# Patient Record
Sex: Male | Born: 1945
Health system: Southern US, Community
[De-identification: ages and names within clinical notes are randomized; demographics above are authoritative.]

## PROBLEM LIST (undated history)

## (undated) DIAGNOSIS — F329 Major depressive disorder, single episode, unspecified: Secondary | ICD-10-CM

## (undated) DIAGNOSIS — G4730 Sleep apnea, unspecified: Secondary | ICD-10-CM

## (undated) DIAGNOSIS — Z9079 Acquired absence of other genital organ(s): Secondary | ICD-10-CM

## (undated) DIAGNOSIS — I421 Obstructive hypertrophic cardiomyopathy: Secondary | ICD-10-CM

## (undated) DIAGNOSIS — Z950 Presence of cardiac pacemaker: Secondary | ICD-10-CM

## (undated) DIAGNOSIS — F32A Depression, unspecified: Secondary | ICD-10-CM

## (undated) DIAGNOSIS — C6290 Malignant neoplasm of unspecified testis, unspecified whether descended or undescended: Secondary | ICD-10-CM

## (undated) DIAGNOSIS — E785 Hyperlipidemia, unspecified: Secondary | ICD-10-CM

## (undated) HISTORY — DX: Obstructive hypertrophic cardiomyopathy: I42.1

## (undated) HISTORY — DX: Acquired absence of other genital organ(s): Z90.79

## (undated) HISTORY — DX: Depression, unspecified: F32.A

## (undated) HISTORY — DX: Major depressive disorder, single episode, unspecified: F32.9

## (undated) HISTORY — DX: Hyperlipidemia, unspecified: E78.5

## (undated) HISTORY — DX: Malignant neoplasm of unspecified testis, unspecified whether descended or undescended: C62.90

## (undated) HISTORY — DX: Sleep apnea, unspecified: G47.30

## (undated) HISTORY — PX: ORCHIECTOMY: SHX2116

---

## 1988-11-07 DIAGNOSIS — C6290 Malignant neoplasm of unspecified testis, unspecified whether descended or undescended: Secondary | ICD-10-CM

## 1988-11-07 HISTORY — DX: Malignant neoplasm of unspecified testis, unspecified whether descended or undescended: C62.90

## 1999-04-12 ENCOUNTER — Other Ambulatory Visit: Admission: RE | Admit: 1999-04-12 | Discharge: 1999-04-12 | Payer: Self-pay | Admitting: Family Medicine

## 2001-03-03 ENCOUNTER — Ambulatory Visit (HOSPITAL_COMMUNITY): Admission: RE | Admit: 2001-03-03 | Discharge: 2001-03-03 | Payer: Self-pay | Admitting: Neurosurgery

## 2001-03-03 ENCOUNTER — Encounter: Payer: Self-pay | Admitting: Neurosurgery

## 2001-05-04 ENCOUNTER — Encounter: Admission: RE | Admit: 2001-05-04 | Discharge: 2001-05-04 | Payer: Self-pay | Admitting: Family Medicine

## 2001-05-04 ENCOUNTER — Encounter: Payer: Self-pay | Admitting: Family Medicine

## 2003-11-25 ENCOUNTER — Ambulatory Visit (HOSPITAL_COMMUNITY): Admission: RE | Admit: 2003-11-25 | Discharge: 2003-11-25 | Payer: Self-pay | Admitting: *Deleted

## 2004-09-11 ENCOUNTER — Inpatient Hospital Stay (HOSPITAL_COMMUNITY): Admission: EM | Admit: 2004-09-11 | Discharge: 2004-09-13 | Payer: Self-pay | Admitting: Psychiatry

## 2004-09-11 ENCOUNTER — Ambulatory Visit: Payer: Self-pay | Admitting: Psychiatry

## 2004-09-14 ENCOUNTER — Other Ambulatory Visit (HOSPITAL_COMMUNITY): Admission: RE | Admit: 2004-09-14 | Discharge: 2004-09-15 | Payer: Self-pay | Admitting: Psychiatry

## 2005-07-23 ENCOUNTER — Encounter: Admission: RE | Admit: 2005-07-23 | Discharge: 2005-07-23 | Payer: Self-pay | Admitting: Neurology

## 2008-03-24 ENCOUNTER — Encounter: Admission: RE | Admit: 2008-03-24 | Discharge: 2008-03-24 | Payer: Self-pay | Admitting: Family Medicine

## 2011-03-25 NOTE — Discharge Summary (Signed)
NAMEKEAGON, Michael Barton            ACCOUNT NO.:  [id number]   MEDICAL RECORD NO.:  [id number]          PATIENT TYPE:  IPS   LOCATION:  0307                          FACILITY:  [facility]   PHYSICIAN:  Geoffery Lyons, M.D.      DATE OF BIRTH:  Apr 28, 1946   DATE OF ADMISSION:  09/11/2004  DATE OF DISCHARGE:  09/13/2004                                 DISCHARGE SUMMARY   CHIEF COMPLAINT AND PRESENT ILLNESS:  This was the first admission to [facility] for this 65 year old single white male.  Presented at  the [facility] stating that, during the last few months prior  to this admission, he has been experiencing increased depression.  Endorsed  anhedonia, helplessness, hopelessness, plan to starve himself to death.  Wanted to get his thoughts cleared up.  Episodes of depression as far back  as 1972.  Most recent episode began in the spring of September of 2004.  Depressed and anxious after a financial loss.  Sought treatment with Dr.  Elna Breslow.  He was regulated on Effexor.  His symptoms resolved.  Stayed on  medications until the past spring and then eventually weaned off.  Not sure  why he was getting depressed again.  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   PAST PSYCHIATRIC HISTORY:  First episode was in 61.  Was inpatient in  [place].  Next episode was in 1985.  He was admitted to [facility].  He was patient of Dr. Berna Spare.  He had a free period without  symptoms until spring of 2004.   ALCOHOL/DRUG HISTORY:  Denies the use and there is no evidence of abuse of  any substances.   MEDICAL HISTORY:  Colon spasms.   MEDICATIONS:  Restoril 30 mg at night, Effexor XR 75 mg in the morning.   PHYSICAL EXAMINATION:  Performed and failed to show any acute findings.   LABORATORY DATA:  Within normal limits.   MENTAL STATUS EXAM:  Alert, cooperative male.  Appropriately groomed and  dressed.  Appropriate  eye contact.  Speech was not pressured.  He is  depressed.  Mood was labile.  Affect was congruent, tears up in the  interview.  Wanted reassurance that he would be helped and not sent off to  some mental institution where he would be in a little room the rest of his  life.  He is worrying, unable to stay focused, sense of being overwhelmed.  No active delusions or true paranoia.  Cognition was well-preserved.   ADMISSION DIAGNOSES:   AXIS I:  Major depression, recurrent.   AXIS II:  No diagnosis.   AXIS III:  Spastic colon.   AXIS IV:  Moderate.   AXIS V:  Global Assessment of Functioning upon admission 30; highest Global  Assessment of Functioning in the last year 75-80.   HOSPITAL COURSE:  He was admitted and started in individual and group  psychotherapy.  He was maintained on the Restoril 30 mg at night for sleep.  Effexor was increased to  112.5 mg per day and he was given some Ativan as  needed for anxiety.  He was started on Abilify 20 mg per day.  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  Some suicidal plans to starve himself to death or  shoot himself.  He was given some Risperdal and some Ativan to help with the  acute overwhelming anxiety.  On November 6th, he did sleep.  He felt that  the depression was under better control.  Endorsed decrease in the  ruminative thoughts.  Endorsed that he did not have anymore of those  thoughts.  He usually, when he feels like this, starts catastrophizing and  anticipating the worst.  Fearful that his faith was not strong enough,  ruminating, thinking that there were character defects.  Sense of  hopelessness and helplessness, thinking that he will never get well.  On  November 7th, he was in full contact with reality.  Insightful in terms of  his depression and his obsessive thoughts.  York Spaniel  that he has really never  been suicidal.  The obsessive thoughts make him catastrophize.  Says that  when he gets in that mode, he has a hard time getting out of it.  He was  endorsing no suicidal ideation, no homicidal ideation.  He felt that he  could be treated on an outpatient basis.  He was feeling that staying in the  hospital was not helping but, if anything, could make him worse.  We went  ahead and discharged to pursue outpatient treatment through the mental  health intensive outpatient program at [facility].   DISCHARGE DIAGNOSES:   AXIS I:  1.  Major depression, recurrent.  2.  Anxiety disorder not otherwise specified with obsessive-compulsive      disorder features.   AXIS II:  No diagnosis.   AXIS III:  Spastic colon.   AXIS IV:  Moderate.   AXIS V:  Global Assessment of Functioning upon discharge 50.   DISCHARGE MEDICATIONS:  1.  Effexor XR 150 mg per day.  2.  Risperdal 0.25 mg twice a day.   FOLLOW UP:  Mental health IOP.     Farrel Gordon   IL/MEDQ  D:  10/06/2004  T:  10/06/2004  Job:  [id number]

## 2011-03-25 NOTE — H&P (Signed)
Michael Barton, Michael Barton            ACCOUNT NO.:  [id number]   MEDICAL RECORD NO.:  [id number]          PATIENT TYPE:  IPS   LOCATION:  0307                          FACILITY:  BH   PHYSICIAN:  Geoffery Lyons, M.D.      DATE OF BIRTH:  [birth year]   DATE OF ADMISSION:  09/11/2004  DATE OF DISCHARGE:                         PSYCHIATRIC ADMISSION ASSESSMENT   IDENTIFYING INFORMATION:  This is a voluntary admission to the services of  Dr. Geoffery Lyons.  This is a 65 year old single white male.  He presented  here at the [facility] last night.  Over the last few months,  he has had increasing depression.  Now he is anhedonic, helpless, hopeless,  no energy.  Has a plan to starve himself to death and wants to get his  thoughts cleared up.  Apparently, he has had episodes of depression as far  back as 1972 and his most recent episode began in the spring of September of  2004.  He was depressed and anxious after a financial loss.  He sought  treatment with Dr. Elna Breslow.  He was regulated on Effexor.  His symptoms  resolved.  He stayed on medications until this past spring and then  eventually weaned off.  He is not sure why he is getting depressed again.  He states that a long-time member of his firm left and he also moved from a  house to a condo.  These are the only triggers he can think of for his  depression.   PAST PSYCHIATRIC HISTORY:  As already stated, his first episode was in 39.  He was an inpatient in [place].  He states that initially they felt he was  actually psychotic.  However, that was changed to a depressive episode.  His  next inpatient stay was here, when it was [facility], in 1985.  He was a  patient of Dr. Bradly Chris at that time and, again, he was having repetitive  thoughts and eventually responded to Elavil.  He then had a free period  without symptoms until the spring of 2004.  A mood disorders questionnaire  was administered.  It was not suspicious for an  underlying mood disorder.   SOCIAL HISTORY:  He finished college.  He never married.  He has no  children.  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   FAMILY HISTORY:  His mother had depression, eventually had to have ECT.   ALCOHOL/DRUG HISTORY:  He has no history with drugs or alcohol.   PRIMARY CARE PHYSICIAN:  Dr. Belinda Fisher at [facility] [address].   MEDICAL PROBLEMS:  He has spasms in his colon.  He has not been able to have  an etiology identified nor is he under treatment for this.   MEDICATIONS:  He is currently prescribed temazepam 30 mg q.h.s. and Effexor  XR 75 mg q.a.m.   ALLERGIES:  He has no known drug allergies.   PHYSICAL EXAMINATION:  He is status post two hernia repairs in the 50s and  resection of a seminoma in 1990.  Otherwise, he is a well-developed, well-  nourished,  white male with no abnormal acute findings.   MENTAL STATUS EXAM:  He is alert and oriented x 3.  He is appropriately  groomed and dressed.  His gait and motor are normal.  His eye contact is  good.  His speech is not pressured.  He is depressed.  His mood is labile.  His affect is congruent.  He tears up in the office.  He just wants to know  that he will be helped and not sent off to some mental institution where he  will be in a little room the rest of his life.  His thought processes are  not clear or focused.  There were no delusions or true paranoia.  His  concentration and memory are fair.  His judgment and insight are fair.  He  is still positive for suicidal ideation.  He is negative for homicidal  ideation and he denies any auditory or visual hallucinations.   DIAGNOSES:   AXIS I:  Major depressive disorder, recurrent with suicidal ideation.   AXIS II:  Negative.   AXIS III:  1.  Spastic colon.  2.  Status post resection of seminoma.  3.  Status post repair of hernias in the 50s.   AXIS IV:  None.   AXIS V:  30.   PLAN:  To admit for safety.  To optimize his  medications.  Towards that end,  we will continue to increase his Effexor and add some Abilify 20 mg p.o.  q.d. to help increase his mood and give him some energy, motivation and,  hopefully, to relieve his suicidal ideation and clear up his thoughts.     Mick   MD/MEDQ  D:  09/11/2004  T:  09/11/2004  Job:  [id number]

## 2011-11-10 DIAGNOSIS — C6290 Malignant neoplasm of unspecified testis, unspecified whether descended or undescended: Secondary | ICD-10-CM | POA: Diagnosis not present

## 2011-11-10 DIAGNOSIS — N400 Enlarged prostate without lower urinary tract symptoms: Secondary | ICD-10-CM | POA: Diagnosis not present

## 2011-11-10 DIAGNOSIS — R39198 Other difficulties with micturition: Secondary | ICD-10-CM | POA: Diagnosis not present

## 2011-11-10 DIAGNOSIS — E291 Testicular hypofunction: Secondary | ICD-10-CM | POA: Diagnosis not present

## 2011-11-16 DIAGNOSIS — F3342 Major depressive disorder, recurrent, in full remission: Secondary | ICD-10-CM | POA: Diagnosis not present

## 2011-11-16 DIAGNOSIS — F319 Bipolar disorder, unspecified: Secondary | ICD-10-CM | POA: Insufficient documentation

## 2012-01-10 DIAGNOSIS — E291 Testicular hypofunction: Secondary | ICD-10-CM | POA: Diagnosis not present

## 2012-01-16 DIAGNOSIS — E7800 Pure hypercholesterolemia, unspecified: Secondary | ICD-10-CM | POA: Diagnosis not present

## 2012-01-17 DIAGNOSIS — E291 Testicular hypofunction: Secondary | ICD-10-CM | POA: Diagnosis not present

## 2012-02-23 DIAGNOSIS — F3342 Major depressive disorder, recurrent, in full remission: Secondary | ICD-10-CM | POA: Diagnosis not present

## 2012-03-21 DIAGNOSIS — F3342 Major depressive disorder, recurrent, in full remission: Secondary | ICD-10-CM | POA: Diagnosis not present

## 2012-03-21 DIAGNOSIS — Z5181 Encounter for therapeutic drug level monitoring: Secondary | ICD-10-CM | POA: Diagnosis not present

## 2012-05-03 DIAGNOSIS — B360 Pityriasis versicolor: Secondary | ICD-10-CM | POA: Diagnosis not present

## 2012-05-03 DIAGNOSIS — G4733 Obstructive sleep apnea (adult) (pediatric): Secondary | ICD-10-CM | POA: Diagnosis not present

## 2012-07-12 DIAGNOSIS — E291 Testicular hypofunction: Secondary | ICD-10-CM | POA: Diagnosis not present

## 2012-07-18 DIAGNOSIS — G4733 Obstructive sleep apnea (adult) (pediatric): Secondary | ICD-10-CM | POA: Diagnosis not present

## 2012-07-18 DIAGNOSIS — Z23 Encounter for immunization: Secondary | ICD-10-CM | POA: Diagnosis not present

## 2012-07-18 DIAGNOSIS — R011 Cardiac murmur, unspecified: Secondary | ICD-10-CM | POA: Diagnosis not present

## 2012-07-18 DIAGNOSIS — B360 Pityriasis versicolor: Secondary | ICD-10-CM | POA: Diagnosis not present

## 2012-07-18 DIAGNOSIS — F329 Major depressive disorder, single episode, unspecified: Secondary | ICD-10-CM | POA: Diagnosis not present

## 2012-07-18 DIAGNOSIS — E7800 Pure hypercholesterolemia, unspecified: Secondary | ICD-10-CM | POA: Diagnosis not present

## 2012-07-18 DIAGNOSIS — F3289 Other specified depressive episodes: Secondary | ICD-10-CM | POA: Diagnosis not present

## 2012-07-19 DIAGNOSIS — E291 Testicular hypofunction: Secondary | ICD-10-CM | POA: Diagnosis not present

## 2012-07-19 DIAGNOSIS — N400 Enlarged prostate without lower urinary tract symptoms: Secondary | ICD-10-CM | POA: Diagnosis not present

## 2012-07-19 DIAGNOSIS — Z8547 Personal history of malignant neoplasm of testis: Secondary | ICD-10-CM | POA: Diagnosis not present

## 2012-08-28 DIAGNOSIS — R7989 Other specified abnormal findings of blood chemistry: Secondary | ICD-10-CM | POA: Diagnosis not present

## 2012-08-28 DIAGNOSIS — F3342 Major depressive disorder, recurrent, in full remission: Secondary | ICD-10-CM | POA: Diagnosis not present

## 2012-09-25 DIAGNOSIS — M25569 Pain in unspecified knee: Secondary | ICD-10-CM | POA: Diagnosis not present

## 2012-09-25 DIAGNOSIS — M7020 Olecranon bursitis, unspecified elbow: Secondary | ICD-10-CM | POA: Diagnosis not present

## 2012-11-22 DIAGNOSIS — M7020 Olecranon bursitis, unspecified elbow: Secondary | ICD-10-CM | POA: Diagnosis not present

## 2012-11-27 DIAGNOSIS — F3342 Major depressive disorder, recurrent, in full remission: Secondary | ICD-10-CM | POA: Diagnosis not present

## 2012-12-05 DIAGNOSIS — M7020 Olecranon bursitis, unspecified elbow: Secondary | ICD-10-CM | POA: Diagnosis not present

## 2013-01-14 DIAGNOSIS — C6290 Malignant neoplasm of unspecified testis, unspecified whether descended or undescended: Secondary | ICD-10-CM | POA: Diagnosis not present

## 2013-01-14 DIAGNOSIS — E291 Testicular hypofunction: Secondary | ICD-10-CM | POA: Diagnosis not present

## 2013-01-17 DIAGNOSIS — N400 Enlarged prostate without lower urinary tract symptoms: Secondary | ICD-10-CM | POA: Diagnosis not present

## 2013-01-17 DIAGNOSIS — Z125 Encounter for screening for malignant neoplasm of prostate: Secondary | ICD-10-CM | POA: Diagnosis not present

## 2013-01-17 DIAGNOSIS — E291 Testicular hypofunction: Secondary | ICD-10-CM | POA: Diagnosis not present

## 2013-01-17 DIAGNOSIS — C6290 Malignant neoplasm of unspecified testis, unspecified whether descended or undescended: Secondary | ICD-10-CM | POA: Diagnosis not present

## 2013-02-26 DIAGNOSIS — F319 Bipolar disorder, unspecified: Secondary | ICD-10-CM | POA: Diagnosis not present

## 2013-03-18 DIAGNOSIS — Z1211 Encounter for screening for malignant neoplasm of colon: Secondary | ICD-10-CM | POA: Diagnosis not present

## 2013-04-22 DIAGNOSIS — E291 Testicular hypofunction: Secondary | ICD-10-CM | POA: Diagnosis not present

## 2013-04-22 DIAGNOSIS — N400 Enlarged prostate without lower urinary tract symptoms: Secondary | ICD-10-CM | POA: Diagnosis not present

## 2013-04-22 DIAGNOSIS — C6290 Malignant neoplasm of unspecified testis, unspecified whether descended or undescended: Secondary | ICD-10-CM | POA: Diagnosis not present

## 2013-05-20 DIAGNOSIS — H25099 Other age-related incipient cataract, unspecified eye: Secondary | ICD-10-CM | POA: Diagnosis not present

## 2013-05-20 DIAGNOSIS — H5210 Myopia, unspecified eye: Secondary | ICD-10-CM | POA: Diagnosis not present

## 2013-07-18 DIAGNOSIS — Z1331 Encounter for screening for depression: Secondary | ICD-10-CM | POA: Diagnosis not present

## 2013-07-18 DIAGNOSIS — E7800 Pure hypercholesterolemia, unspecified: Secondary | ICD-10-CM | POA: Diagnosis not present

## 2013-07-18 DIAGNOSIS — G4733 Obstructive sleep apnea (adult) (pediatric): Secondary | ICD-10-CM | POA: Diagnosis not present

## 2013-07-18 DIAGNOSIS — E291 Testicular hypofunction: Secondary | ICD-10-CM | POA: Diagnosis not present

## 2013-07-18 DIAGNOSIS — N183 Chronic kidney disease, stage 3 unspecified: Secondary | ICD-10-CM | POA: Diagnosis not present

## 2013-07-18 DIAGNOSIS — Z79899 Other long term (current) drug therapy: Secondary | ICD-10-CM | POA: Diagnosis not present

## 2013-07-18 DIAGNOSIS — Z1211 Encounter for screening for malignant neoplasm of colon: Secondary | ICD-10-CM | POA: Diagnosis not present

## 2013-07-18 DIAGNOSIS — F329 Major depressive disorder, single episode, unspecified: Secondary | ICD-10-CM | POA: Diagnosis not present

## 2013-08-14 DIAGNOSIS — F319 Bipolar disorder, unspecified: Secondary | ICD-10-CM | POA: Diagnosis not present

## 2013-08-14 DIAGNOSIS — Z79899 Other long term (current) drug therapy: Secondary | ICD-10-CM | POA: Diagnosis not present

## 2013-10-29 DIAGNOSIS — Z79899 Other long term (current) drug therapy: Secondary | ICD-10-CM | POA: Diagnosis not present

## 2013-10-29 DIAGNOSIS — F319 Bipolar disorder, unspecified: Secondary | ICD-10-CM | POA: Diagnosis not present

## 2013-11-11 DIAGNOSIS — D649 Anemia, unspecified: Secondary | ICD-10-CM | POA: Diagnosis not present

## 2013-12-17 DIAGNOSIS — F319 Bipolar disorder, unspecified: Secondary | ICD-10-CM | POA: Diagnosis not present

## 2013-12-30 DIAGNOSIS — Z1211 Encounter for screening for malignant neoplasm of colon: Secondary | ICD-10-CM | POA: Diagnosis not present

## 2013-12-30 DIAGNOSIS — K620 Anal polyp: Secondary | ICD-10-CM | POA: Diagnosis not present

## 2013-12-30 DIAGNOSIS — K648 Other hemorrhoids: Secondary | ICD-10-CM | POA: Diagnosis not present

## 2013-12-30 DIAGNOSIS — D126 Benign neoplasm of colon, unspecified: Secondary | ICD-10-CM | POA: Diagnosis not present

## 2013-12-30 DIAGNOSIS — K621 Rectal polyp: Secondary | ICD-10-CM | POA: Diagnosis not present

## 2014-01-16 DIAGNOSIS — C6290 Malignant neoplasm of unspecified testis, unspecified whether descended or undescended: Secondary | ICD-10-CM | POA: Diagnosis not present

## 2014-01-16 DIAGNOSIS — N400 Enlarged prostate without lower urinary tract symptoms: Secondary | ICD-10-CM | POA: Diagnosis not present

## 2014-01-23 DIAGNOSIS — C6290 Malignant neoplasm of unspecified testis, unspecified whether descended or undescended: Secondary | ICD-10-CM | POA: Diagnosis not present

## 2014-01-23 DIAGNOSIS — N400 Enlarged prostate without lower urinary tract symptoms: Secondary | ICD-10-CM | POA: Diagnosis not present

## 2014-04-22 DIAGNOSIS — F319 Bipolar disorder, unspecified: Secondary | ICD-10-CM | POA: Diagnosis not present

## 2014-07-07 DIAGNOSIS — F319 Bipolar disorder, unspecified: Secondary | ICD-10-CM | POA: Diagnosis not present

## 2014-07-18 DIAGNOSIS — F329 Major depressive disorder, single episode, unspecified: Secondary | ICD-10-CM | POA: Diagnosis not present

## 2014-07-18 DIAGNOSIS — N183 Chronic kidney disease, stage 3 unspecified: Secondary | ICD-10-CM | POA: Diagnosis not present

## 2014-07-18 DIAGNOSIS — G4733 Obstructive sleep apnea (adult) (pediatric): Secondary | ICD-10-CM | POA: Diagnosis not present

## 2014-07-18 DIAGNOSIS — E7800 Pure hypercholesterolemia, unspecified: Secondary | ICD-10-CM | POA: Diagnosis not present

## 2014-07-18 DIAGNOSIS — Z79899 Other long term (current) drug therapy: Secondary | ICD-10-CM | POA: Diagnosis not present

## 2014-07-18 DIAGNOSIS — Z125 Encounter for screening for malignant neoplasm of prostate: Secondary | ICD-10-CM | POA: Diagnosis not present

## 2014-07-18 DIAGNOSIS — E871 Hypo-osmolality and hyponatremia: Secondary | ICD-10-CM | POA: Diagnosis not present

## 2014-07-18 DIAGNOSIS — Z23 Encounter for immunization: Secondary | ICD-10-CM | POA: Diagnosis not present

## 2014-07-30 DIAGNOSIS — E876 Hypokalemia: Secondary | ICD-10-CM | POA: Diagnosis not present

## 2014-08-28 DIAGNOSIS — R51 Headache: Secondary | ICD-10-CM | POA: Diagnosis not present

## 2014-08-28 DIAGNOSIS — H5213 Myopia, bilateral: Secondary | ICD-10-CM | POA: Diagnosis not present

## 2014-10-14 DIAGNOSIS — G4733 Obstructive sleep apnea (adult) (pediatric): Secondary | ICD-10-CM | POA: Diagnosis not present

## 2014-10-14 DIAGNOSIS — Z23 Encounter for immunization: Secondary | ICD-10-CM | POA: Diagnosis not present

## 2014-10-14 DIAGNOSIS — E780 Pure hypercholesterolemia: Secondary | ICD-10-CM | POA: Diagnosis not present

## 2014-11-13 ENCOUNTER — Encounter: Payer: Self-pay | Admitting: *Deleted

## 2015-01-22 DIAGNOSIS — N400 Enlarged prostate without lower urinary tract symptoms: Secondary | ICD-10-CM | POA: Diagnosis not present

## 2015-01-22 DIAGNOSIS — C6290 Malignant neoplasm of unspecified testis, unspecified whether descended or undescended: Secondary | ICD-10-CM | POA: Diagnosis not present

## 2015-01-22 DIAGNOSIS — Z125 Encounter for screening for malignant neoplasm of prostate: Secondary | ICD-10-CM | POA: Diagnosis not present

## 2015-01-29 DIAGNOSIS — C6290 Malignant neoplasm of unspecified testis, unspecified whether descended or undescended: Secondary | ICD-10-CM | POA: Diagnosis not present

## 2015-01-29 DIAGNOSIS — E291 Testicular hypofunction: Secondary | ICD-10-CM | POA: Diagnosis not present

## 2015-02-09 DIAGNOSIS — E291 Testicular hypofunction: Secondary | ICD-10-CM | POA: Diagnosis not present

## 2015-02-17 DIAGNOSIS — E291 Testicular hypofunction: Secondary | ICD-10-CM | POA: Diagnosis not present

## 2015-03-24 DIAGNOSIS — E291 Testicular hypofunction: Secondary | ICD-10-CM | POA: Diagnosis not present

## 2015-05-14 DIAGNOSIS — E291 Testicular hypofunction: Secondary | ICD-10-CM | POA: Diagnosis not present

## 2015-05-21 DIAGNOSIS — E291 Testicular hypofunction: Secondary | ICD-10-CM | POA: Diagnosis not present

## 2015-07-07 DIAGNOSIS — F319 Bipolar disorder, unspecified: Secondary | ICD-10-CM | POA: Diagnosis not present

## 2015-07-20 DIAGNOSIS — E780 Pure hypercholesterolemia: Secondary | ICD-10-CM | POA: Diagnosis not present

## 2015-07-20 DIAGNOSIS — Z79899 Other long term (current) drug therapy: Secondary | ICD-10-CM | POA: Diagnosis not present

## 2015-07-20 DIAGNOSIS — N183 Chronic kidney disease, stage 3 (moderate): Secondary | ICD-10-CM | POA: Diagnosis not present

## 2015-07-20 DIAGNOSIS — F322 Major depressive disorder, single episode, severe without psychotic features: Secondary | ICD-10-CM | POA: Diagnosis not present

## 2015-07-20 DIAGNOSIS — E291 Testicular hypofunction: Secondary | ICD-10-CM | POA: Diagnosis not present

## 2015-07-20 DIAGNOSIS — Z1389 Encounter for screening for other disorder: Secondary | ICD-10-CM | POA: Diagnosis not present

## 2015-07-20 DIAGNOSIS — L821 Other seborrheic keratosis: Secondary | ICD-10-CM | POA: Diagnosis not present

## 2015-07-20 DIAGNOSIS — G4733 Obstructive sleep apnea (adult) (pediatric): Secondary | ICD-10-CM | POA: Diagnosis not present

## 2015-09-15 DIAGNOSIS — H35033 Hypertensive retinopathy, bilateral: Secondary | ICD-10-CM | POA: Diagnosis not present

## 2015-09-15 DIAGNOSIS — H52223 Regular astigmatism, bilateral: Secondary | ICD-10-CM | POA: Diagnosis not present

## 2015-09-15 DIAGNOSIS — H25093 Other age-related incipient cataract, bilateral: Secondary | ICD-10-CM | POA: Diagnosis not present

## 2015-11-17 DIAGNOSIS — E291 Testicular hypofunction: Secondary | ICD-10-CM | POA: Diagnosis not present

## 2015-11-17 DIAGNOSIS — Z125 Encounter for screening for malignant neoplasm of prostate: Secondary | ICD-10-CM | POA: Diagnosis not present

## 2015-11-24 DIAGNOSIS — E291 Testicular hypofunction: Secondary | ICD-10-CM | POA: Diagnosis not present

## 2015-11-24 DIAGNOSIS — Z0000 Encounter for general adult medical examination without abnormal findings: Secondary | ICD-10-CM | POA: Diagnosis not present

## 2016-01-19 DIAGNOSIS — Z79899 Other long term (current) drug therapy: Secondary | ICD-10-CM | POA: Diagnosis not present

## 2016-01-19 DIAGNOSIS — F319 Bipolar disorder, unspecified: Secondary | ICD-10-CM | POA: Diagnosis not present

## 2016-01-29 DIAGNOSIS — E871 Hypo-osmolality and hyponatremia: Secondary | ICD-10-CM | POA: Insufficient documentation

## 2016-01-29 DIAGNOSIS — R011 Cardiac murmur, unspecified: Secondary | ICD-10-CM | POA: Insufficient documentation

## 2016-01-29 DIAGNOSIS — R0600 Dyspnea, unspecified: Secondary | ICD-10-CM | POA: Diagnosis not present

## 2016-02-23 ENCOUNTER — Ambulatory Visit (INDEPENDENT_AMBULATORY_CARE_PROVIDER_SITE_OTHER): Payer: Medicare Other | Admitting: Cardiovascular Disease

## 2016-02-23 ENCOUNTER — Encounter: Payer: Self-pay | Admitting: Cardiovascular Disease

## 2016-02-23 VITALS — BP 140/90 | HR 68 | Ht 75.0 in | Wt 209.6 lb

## 2016-02-23 DIAGNOSIS — E785 Hyperlipidemia, unspecified: Secondary | ICD-10-CM

## 2016-02-23 DIAGNOSIS — R0609 Other forms of dyspnea: Secondary | ICD-10-CM | POA: Diagnosis not present

## 2016-02-23 DIAGNOSIS — R0602 Shortness of breath: Secondary | ICD-10-CM | POA: Diagnosis not present

## 2016-02-23 NOTE — Patient Instructions (Addendum)
Medication Instructions:  Your physician recommends that you continue on your current medications as directed. Please refer to the Current Medication list given to you today.   Labwork: None Ordered   Testing/Procedures: Your physician has requested that you have an echocardiogram. Echocardiography is a painless test that uses sound waves to create images of your heart. It provides your doctor with information about the size and shape of your heart and how well your heart's chambers and valves are working. This procedure takes approximately one hour. There are no restrictions for this procedure.   Your physician has requested that you have a lexiscan myoview. For further information please visit [email or web address]. Please follow instruction sheet, as given.   Follow-Up: Your physician recommends that you schedule a follow-up appointment in: 1 month with Dr. Acie Fredrickson   If you need a refill on your cardiac medications before your next appointment, please call your pharmacy.   Thank you for choosing [facility]! Christen Bame, RN [phone]

## 2016-02-23 NOTE — Progress Notes (Signed)
Cardiology Office Note   Date:  02/23/2016   ID:  Michael Barton, DOB 1946-08-08, MRN [id number]  PCP:  Simona Huh, MD  Cardiologist:   Thayer Headings, MD   Chief Complaint  Patient presents with  . Shortness of Breath   1. Shortness of breath 2. Hyperlipidemia 3. Obstructive sleep apnea 4. Depression    History of Present Illness: Michael Barton is a 70 y.o. male who presents for DOE Was an avid runner and cyclist until 5 years ago. Is not able to run or cycle recently - has been a steady decline  He can now run for 1 minute and then walk for 2-3   He has noticed a definint decline in his abiltiy - now gets short of breath doing everyday activities. He become short of breath climbing stairs and walking from his office out to the car.  He denies any chest pain. He has no shortness of breath at rest. He's had an echo card gram the past which reveals left ventricular hypertrophy.  He work at an Database administrator    Past Medical History  Diagnosis Date  . Hyperlipidemia   . Depression   . Sleep apnea   . Testicular cancer [facility])     Past Surgical History  Procedure Laterality Date  . Orchiectomy       Current Outpatient Prescriptions  Medication Sig Dispense Refill  . ANDROGEL PUMP 20.25 MG/ACT (1.62%) GEL Apply 1 application topically daily. 1 APPLICATION UNDER EACH ARM DAILY    . buPROPion (WELLBUTRIN) 100 MG tablet Take 100 mg by mouth 2 (two) times daily.    Marland Kitchen lamoTRIgine (LAMICTAL) 200 MG tablet Take 200 mg by mouth daily.    . naproxen sodium (ANAPROX) 220 MG tablet Take 220 mg by mouth 2 (two) times daily with a meal.    . pravastatin (PRAVACHOL) 40 MG tablet Take 40 mg by mouth daily.    . QUEtiapine (SEROQUEL) 200 MG tablet Take 200 mg by mouth at bedtime.    Marland Kitchen venlafaxine XR (EFFEXOR-XR) 150 MG 24 hr capsule Take 150 mg by mouth daily with breakfast.     No current facility-administered medications for this visit.    Allergies:    Ambien and Prozac    Social History:  The patient  reports that he has never smoked. He does not have any smokeless tobacco history on file. He reports that he does not drink alcohol or use illicit drugs.   Family History:  The patient's family history includes Cancer in his father; Depression in his mother; Thyroid disease in his mother.    ROS:  Please see the history of present illness.    Review of Systems: Constitutional:  denies fever, chills, diaphoresis, appetite change and fatigue.  HEENT: denies photophobia, eye pain, redness, hearing loss, ear pain, congestion, sore throat, rhinorrhea, sneezing, neck pain, neck stiffness and tinnitus.  Respiratory: admits to SOB, DOE,    Denies cough, chest tightness, and wheezing.  Cardiovascular: denies chest pain, palpitations and leg swelling.  Gastrointestinal: denies nausea, vomiting, abdominal pain, diarrhea, constipation, blood in stool.  Genitourinary: denies dysuria, urgency, frequency, hematuria, flank pain and difficulty urinating.  Musculoskeletal: denies  myalgias, back pain, joint swelling, arthralgias and gait problem.   Skin: denies pallor, rash and wound.  Neurological: denies dizziness, seizures, syncope, weakness, light-headedness, numbness and headaches.   Hematological: denies adenopathy, easy bruising, personal or family bleeding history.  Psychiatric/ Behavioral: denies suicidal ideation, mood changes,  confusion, nervousness, sleep disturbance and agitation.       All other systems are reviewed and negative.    PHYSICAL EXAM: VS:  BP 140/90 mmHg  Pulse 68  Ht 6\' 3"  (1.905 m)  Wt 209 lb 9.6 oz (95.074 kg)  BMI 26.20 kg/m2 , BMI Body mass index is 26.2 kg/(m^2). GEN: Well nourished, well developed, in no acute distress HEENT: normal Neck: no JVD, carotid bruits, or masses Cardiac: RRR; soft systolic  Murmur,  No , rubs, or gallops,no edema  Respiratory:  clear to auscultation bilaterally, normal work of  breathing GI: soft, nontender, nondistended, + BS MS: no deformity or atrophy Skin: warm and dry, no rash Neuro:  Strength and sensation are intact Psych: normal   EKG:  EKG is ordered today. The ekg ordered today demonstrates  NSR at 68.   TWI in lateral leads ( leads 1 and aVL)    Recent Labs: No results found for requested labs within last 365 days.    Lipid Panel No results found for: CHOL, TRIG, HDL, CHOLHDL, VLDL, LDLCALC, LDLDIRECT    Wt Readings from Last 3 Encounters:  02/23/16 209 lb 9.6 oz (95.074 kg)      Other studies Reviewed: Additional studies/ records that were reviewed today include: . Review of the above records demonstrates:    ASSESSMENT AND PLAN:  1.  Shortness of breath with exertion: Show presents with episodes of shortness of breath with exertion. These episodes have been progressive for the past 4-5 years. He used to be an avid runner and avid cyclist but now become short of breath doing everyday activities. He can still talk for about a minute but then has to stop and walk for 2-3 minutes.  He has a systolic murmur but it does not sound like he has significant valvular problems. It also is not consistent with a hypertrophic obstructive cardio myopathy.  Right to do a stress Myoview study for further evaluation. He could have ischemic heart disease. We'll also get an echo card gram for further assessment of his shortness of breath with exertion.   Current medicines are reviewed at length with the patient today.  The patient does not have concerns regarding medicines.  The following changes have been made:  no change  Labs/ tests ordered today include:  No orders of the defined types were placed in this encounter.     Disposition:   FU with me in several months       Evamarie Raetz, Wonda Cheng, MD  02/23/2016 4:15 PM    [facility] [place], [place], [place]  [address] Phone: [phone]; Fax: [phone]    [facility]  [address] [place], [place]  [address] [phone]   Fax [phone]

## 2016-02-24 ENCOUNTER — Telehealth: Payer: Self-pay | Admitting: Cardiovascular Disease

## 2016-02-24 NOTE — Telephone Encounter (Signed)
Walk in patient form- medication list - given to [facility]

## 2016-03-17 ENCOUNTER — Ambulatory Visit (HOSPITAL_COMMUNITY): Payer: Medicare Other | Attending: Internal Medicine

## 2016-03-22 ENCOUNTER — Encounter (HOSPITAL_COMMUNITY): Payer: Medicare Other

## 2016-03-23 ENCOUNTER — Ambulatory Visit (HOSPITAL_COMMUNITY): Payer: Medicare Other

## 2016-03-23 ENCOUNTER — Telehealth (HOSPITAL_COMMUNITY): Payer: Self-pay | Admitting: *Deleted

## 2016-03-23 NOTE — Telephone Encounter (Signed)
.  Attempted to call Patient regarding upcoming nuclear appointment- no answer. Hubbard Robinson, RN

## 2016-03-28 ENCOUNTER — Ambulatory Visit (HOSPITAL_COMMUNITY): Payer: Medicare Other | Attending: Cardiology

## 2016-03-28 DIAGNOSIS — R0609 Other forms of dyspnea: Secondary | ICD-10-CM | POA: Diagnosis not present

## 2016-03-28 LAB — MYOCARDIAL PERFUSION IMAGING
CHL CUP NUCLEAR SSS: 9
LHR: 0.22
LV dias vol: 129 mL (ref 62–150)
LV sys vol: 55 mL
Peak HR: 75 {beats}/min
Rest HR: 60 {beats}/min
SDS: 3
SRS: 6
TID: 1.02

## 2016-03-28 MED ORDER — REGADENOSON 0.4 MG/5ML IV SOLN
0.4000 mg | Freq: Once | INTRAVENOUS | Status: AC
  Administered 2016-03-28: 0.4 mg via INTRAVENOUS

## 2016-03-28 MED ORDER — TECHNETIUM TC 99M TETROFOSMIN IV KIT
31.9000 | PACK | Freq: Once | INTRAVENOUS | Status: AC | PRN
  Administered 2016-03-28: 31.9 via INTRAVENOUS
  Filled 2016-03-28: qty 32

## 2016-03-28 MED ORDER — TECHNETIUM TC 99M TETROFOSMIN IV KIT
11.0000 | PACK | Freq: Once | INTRAVENOUS | Status: AC | PRN
  Administered 2016-03-28: 11 via INTRAVENOUS
  Filled 2016-03-28: qty 11

## 2016-03-29 ENCOUNTER — Ambulatory Visit (HOSPITAL_COMMUNITY): Payer: Medicare Other

## 2016-03-31 ENCOUNTER — Encounter: Payer: Self-pay | Admitting: Cardiology

## 2016-04-01 ENCOUNTER — Ambulatory Visit (INDEPENDENT_AMBULATORY_CARE_PROVIDER_SITE_OTHER): Payer: Medicare Other | Admitting: Cardiovascular Disease

## 2016-04-01 ENCOUNTER — Encounter: Payer: Self-pay | Admitting: Cardiovascular Disease

## 2016-04-01 VITALS — BP 134/72 | HR 80 | Ht 75.0 in | Wt 212.8 lb

## 2016-04-01 DIAGNOSIS — R0609 Other forms of dyspnea: Secondary | ICD-10-CM

## 2016-04-01 DIAGNOSIS — R0600 Dyspnea, unspecified: Secondary | ICD-10-CM | POA: Diagnosis not present

## 2016-04-01 NOTE — Progress Notes (Signed)
Cardiology Office Note   Date:  04/01/2016   ID:  Michael Barton, DOB Feb 20, 1946, MRN [id number]  PCP:  Simona Huh, MD  Cardiologist:   Mertie Moores, MD   Chief Complaint  Patient presents with  . Shortness of Breath   1. Shortness of breath 2. Hyperlipidemia 3. Obstructive sleep apnea 4. Depression    History of Present Illness: Michael Barton is a 70 y.o. male who presents for DOE Was an avid runner and cyclist until 5 years ago. Is not able to run or cycle recently - has been a steady decline  He can now run for 1 minute and then walk for 2-3   He has noticed a definint decline in his abiltiy - now gets short of breath doing everyday activities. He become short of breath climbing stairs and walking from his office out to the car.  He denies any chest pain. He has no shortness of breath at rest. He's had an echo card gram the past which reveals left ventricular hypertrophy.  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 A stress Myoview study showed no ischemia. His ejection fraction was 58%.  Michael Barton has not improved.   Has not had his echo yet.   Past Medical History  Diagnosis Date  . Hyperlipidemia   . Depression   . Sleep apnea   . Testicular cancer [facility])     Past Surgical History  Procedure Laterality Date  . Orchiectomy       Current Outpatient Prescriptions  Medication Sig Dispense Refill  . ANDROGEL PUMP 20.25 MG/ACT (1.62%) GEL Apply 1 application topically daily. 1 APPLICATION UNDER EACH ARM DAILY    . buPROPion (WELLBUTRIN) 100 MG tablet Take 100 mg by mouth 2 (two) times daily.    Marland Kitchen lamoTRIgine (LAMICTAL) 200 MG tablet Take 200 mg by mouth daily.    . naproxen sodium (ANAPROX) 220 MG tablet Take 220 mg by mouth 2 (two) times daily with a meal.    . pravastatin (PRAVACHOL) 40 MG tablet Take 40 mg by mouth daily.    . QUEtiapine (SEROQUEL)  200 MG tablet Take 200 mg by mouth at bedtime.    Marland Kitchen venlafaxine XR (EFFEXOR-XR) 150 MG 24 hr capsule Take 150 mg by mouth daily with breakfast.     No current facility-administered medications for this visit.    Allergies:   Ambien and Prozac    Social History:  The patient  reports that he has never smoked. He does not have any smokeless tobacco history on file. He reports that he does not drink alcohol or use illicit drugs.   Family History:  The patient's family history includes Cancer in his father; Depression in his mother; Thyroid disease in his mother.    ROS:  Please see the history of present illness.    Review of Systems: Constitutional:  denies fever, chills, diaphoresis, appetite change and fatigue.  HEENT: denies photophobia, eye pain, redness, hearing loss, ear pain, congestion, sore throat, rhinorrhea, sneezing, neck pain, neck stiffness and tinnitus.  Respiratory: admits to SOB, DOE,    Denies cough, chest tightness, and wheezing.  Cardiovascular: denies chest pain, palpitations and leg swelling.  Gastrointestinal: denies nausea, vomiting, abdominal pain, diarrhea, constipation, blood in stool.  Genitourinary: denies dysuria, urgency, frequency, hematuria, flank pain and difficulty urinating.  Musculoskeletal: denies  myalgias, back pain, joint swelling,  arthralgias and gait problem.   Skin: denies pallor, rash and wound.  Neurological: denies dizziness, seizures, syncope, weakness, light-headedness, numbness and headaches.   Hematological: denies adenopathy, easy bruising, personal or family bleeding history.  Psychiatric/ Behavioral: denies suicidal ideation, mood changes, confusion, nervousness, sleep disturbance and agitation.       All other systems are reviewed and negative.    PHYSICAL EXAM: VS:  BP 134/72 mmHg  Pulse 80  Ht 6\' 3"  (1.905 m)  Wt 212 lb 12.8 oz (96.525 kg)  BMI 26.60 kg/m2 , BMI Body mass index is 26.6 kg/(m^2). GEN: Well nourished, well  developed, in no acute distress HEENT: normal Neck: no JVD, carotid bruits, or masses Cardiac: RRR; soft systolic Murmur,  No , rubs, or gallops,no edema  Respiratory:  clear to auscultation bilaterally, normal work of breathing GI: soft, nontender, nondistended, + BS MS: no deformity or atrophy Skin: warm and dry, no rash Neuro:  Strength and sensation are intact Psych: normal   EKG:  EKG is ordered today. The ekg ordered today demonstrates  NSR at 68.   TWI in lateral leads ( leads 1 and aVL)    Recent Labs: No results found for requested labs within last 365 days.    Lipid Panel No results found for: CHOL, TRIG, HDL, CHOLHDL, VLDL, LDLCALC, LDLDIRECT    Wt Readings from Last 3 Encounters:  04/01/16 212 lb 12.8 oz (96.525 kg)  02/23/16 209 lb 9.6 oz (95.074 kg)      Other studies Reviewed: Additional studies/ records that were reviewed today include: . Review of the above records demonstrates:    ASSESSMENT AND PLAN:  1.  Shortness of breath with exertion: Michael Barton has been an avid runner and cyclist for years but has noticed a recent deteriation in his endurance. Stress myoview showed no ischemia.    Echo has not been done yet I would like to get a cardiopulmonary stress test  He is considering getting a pulmonary consultation  I've advised him to work on getting back into shape - start slow and gradually build up his work outs.    Current medicines are reviewed at length with the patient today.  The patient does not have concerns regarding medicines.  The following changes have been made:  no change  Labs/ tests ordered today include:   Orders Placed This Encounter  Procedures  . Cardiopulmonary exercise test    Disposition:   FU with me in 6  months     Mertie Moores, MD  04/01/2016 2:52 PM    [facility] [place], [place], [place]  [address] Phone: [phone]; Fax: [phone]   [facility]  [address] [place], [place]  [address] [phone]   Fax [phone]

## 2016-04-01 NOTE — Patient Instructions (Signed)
Medication Instructions:  Your physician recommends that you continue on your current medications as directed. Please refer to the Current Medication list given to you today.   Labwork: None Ordered   Testing/Procedures: SCHEDULE Echo - it is already ordered  Your physician has recommended that you have a cardiopulmonary stress test (CPX). CPX testing is a non-invasive measurement of heart and lung function. It replaces a traditional treadmill stress test. This type of test provides a tremendous amount of information that relates not only to your present condition but also for future outcomes. This test combines measurements of you ventilation, respiratory gas exchange in the lungs, electrocardiogram (EKG), blood pressure and physical response before, during, and following an exercise protocol.   Follow-Up: Your physician wants you to follow-up in: 6 months with Dr. Acie Fredrickson.  You will receive a reminder letter in the mail two months in advance. If you don't receive a letter, please call our office to schedule the follow-up appointment.   If you need a refill on your cardiac medications before your next appointment, please call your pharmacy.   Thank you for choosing [facility]! Christen Bame, RN [phone]

## 2016-04-12 ENCOUNTER — Other Ambulatory Visit (HOSPITAL_COMMUNITY): Payer: Self-pay | Admitting: *Deleted

## 2016-04-12 ENCOUNTER — Ambulatory Visit (HOSPITAL_COMMUNITY): Payer: Medicare Other | Attending: Cardiovascular Disease

## 2016-04-12 DIAGNOSIS — R0600 Dyspnea, unspecified: Secondary | ICD-10-CM | POA: Insufficient documentation

## 2016-04-13 ENCOUNTER — Telehealth: Payer: Self-pay | Admitting: Nurse Practitioner

## 2016-04-13 MED ORDER — CARVEDILOL 3.125 MG PO TABS: 3.1250 mg | ORAL_TABLET | Freq: Two times a day (BID) | ORAL | Status: DC

## 2016-04-13 NOTE — Telephone Encounter (Signed)
Reviewed results and plan of care with patient.  He was scheduled for 6 month follow-up so I have scheduled him to see Dr. Acie Fredrickson in August to see how he is tolerating carvedilol.  Patient verbalized understanding and agreement with plan of care and thanked me for the call.

## 2016-04-13 NOTE — Telephone Encounter (Signed)
-----   Message from Thayer Headings, MD sent at 04/12/2016  5:23 PM EDT ----- Overall normal functional capacity.   Slight hypertensive response to exercise Will add Carvedilol 3.125 bid . Have him continue to gradually increase his work outs.  Will see him in several months

## 2016-04-18 ENCOUNTER — Other Ambulatory Visit: Payer: Self-pay

## 2016-04-18 ENCOUNTER — Ambulatory Visit (HOSPITAL_COMMUNITY): Payer: Medicare Other | Attending: Cardiovascular Disease

## 2016-04-18 DIAGNOSIS — E785 Hyperlipidemia, unspecified: Secondary | ICD-10-CM | POA: Diagnosis not present

## 2016-04-18 DIAGNOSIS — I517 Cardiomegaly: Secondary | ICD-10-CM | POA: Diagnosis not present

## 2016-04-18 DIAGNOSIS — R0600 Dyspnea, unspecified: Secondary | ICD-10-CM | POA: Diagnosis present

## 2016-04-18 DIAGNOSIS — R0609 Other forms of dyspnea: Secondary | ICD-10-CM | POA: Diagnosis not present

## 2016-04-18 DIAGNOSIS — I340 Nonrheumatic mitral (valve) insufficiency: Secondary | ICD-10-CM | POA: Diagnosis not present

## 2016-04-18 LAB — ECHOCARDIOGRAM COMPLETE
AO mean calculated velocity dopler: 163 cm/s
AOVTI: 48.8 cm
AV Area VTI index: 1.07 cm2/m2
AV Area VTI: 2.37 cm2
AV Mean grad: 12 mmHg
AV Peak grad: 20 mmHg
AVAREAMEANV: 2.39 cm2
AVAREAMEANVIN: 1.05 cm2/m2
AVCELMEANRAT: 0.84
AVLVOTPG: 14 mmHg
AVPKVEL: 225 cm/s
Ao pk vel: 0.84 m/s
Ao-asc: 35 cm
CHL CUP AV PEAK INDEX: 1.04
CHL CUP AV VALUE AREA INDEX: 1.07
CHL CUP AV VEL: 2.42
CHL CUP DOP CALC LVOT VTI: 41.5 cm
CHL CUP STROKE VOLUME: 63 mL
E decel time: 289 msec
EERAT: 10.72
FS: 29 % (ref 28–44)
IVS/LV PW RATIO, ED: 1.13
LA ID, A-P, ES: 41 mm
LA vol index: 33 mL/m2
LADIAMINDEX: 1.81 cm/m2
LAVOL: 75 mL
LAVOLA4C: 53 mL
LDCA: 2.84 cm2
LEFT ATRIUM END SYS DIAM: 41 mm
LV E/e' medial: 10.72
LV E/e'average: 10.72
LV SIMPSON'S DISK: 68
LV TDI E'MEDIAL: 4.48
LV dias vol index: 41 mL/m2
LV dias vol: 92 mL (ref 62–150)
LV sys vol index: 13 mL/m2
LVELAT: 7.51 cm/s
LVOT SV: 118 mL
LVOT diameter: 19 mm
LVOTPV: 188 cm/s
LVOTVTI: 0.85 cm
LVSYSVOL: 29 mL (ref 21–61)
Lateral S' vel: 10.5 cm/s
MV Dec: 289
MVPG: 3 mmHg
MVPKAVEL: 94.8 m/s
MVPKEVEL: 80.5 m/s
PW: 14.5 mm — AB (ref 0.6–1.1)
TDI e' lateral: 7.51
Valve area: 2.42 cm2

## 2016-04-19 ENCOUNTER — Telehealth: Payer: Self-pay | Admitting: Cardiovascular Disease

## 2016-04-19 NOTE — Telephone Encounter (Signed)
Santiago Glad from [employer] is Optometrist a drug interaction between Coreg and Seroquel .

## 2016-04-19 NOTE — Telephone Encounter (Signed)
Seroquel itself can prolong QTc but addition of Coreg should not affect QTc. Looks like pt has been on Seroquel for a while and has had EKG with normal QTc since then. Combination can potentially increase risk of hypotension though. Would advise pt to monitor BP and contact us if BP becomes low or pt is symptomatic. Historically, BP in clinic have all been fine. No difference between Seroquel + Coreg vs. Seroquel + Toprol, would have pt continue with current therapy and just monitor BP.

## 2016-04-19 NOTE — Telephone Encounter (Signed)
Spoke with Santiago Glad at [employer] and she wanted to make Dr. Acie Fredrickson aware of a drug interaction that flagged in their system. Santiago Glad states that taking Seroquel and Coreg together can cause prolongation of QT intervals. Raynaldo Opitz that I would route this message to Dr. Acie Fredrickson for approval of keeping pt on Coreg and we will call her with response.

## 2016-04-19 NOTE — Telephone Encounter (Signed)
[name]  Does serequel and metoprol have the same issue as serequel and coreg? Thanks  [employer]

## 2016-04-20 NOTE — Telephone Encounter (Signed)
Continue with current therapy and monitor BP  Seen note from [facility], Pharm D.

## 2016-04-20 NOTE — Telephone Encounter (Signed)
Santiago Glad at [facility] notified Dr Acie Fredrickson recommended continue with current therapy and  monitor BP.

## 2016-04-21 ENCOUNTER — Telehealth: Payer: Self-pay | Admitting: Nurse Practitioner

## 2016-04-21 ENCOUNTER — Encounter: Payer: Self-pay | Admitting: Nurse Practitioner

## 2016-04-21 MED ORDER — CARVEDILOL 3.125 MG PO TABS: 6.2500 mg | ORAL_TABLET | Freq: Two times a day (BID) | ORAL | Status: DC

## 2016-04-21 NOTE — Telephone Encounter (Signed)
-----   Message from Thayer Headings, MD sent at 04/18/2016  5:53 PM EDT ----- Echo shows normal LV function Has LVH with slight dynamic obstruction .  He is on a low dose coreg Increase to 6.25 BID

## 2016-04-21 NOTE — Telephone Encounter (Signed)
Reviewed results of echo and plan of care to increase carvedilol to 6.25 mg BID.  Patient verbalized understanding and agreement and is aware he may take 2 of his 3.125 mg tablets twice daily until he is ready to pick up next refill.  He thanked me for the call.

## 2016-05-16 DIAGNOSIS — Z125 Encounter for screening for malignant neoplasm of prostate: Secondary | ICD-10-CM | POA: Diagnosis not present

## 2016-05-16 DIAGNOSIS — E291 Testicular hypofunction: Secondary | ICD-10-CM | POA: Diagnosis not present

## 2016-06-08 ENCOUNTER — Other Ambulatory Visit: Payer: Self-pay

## 2016-06-08 MED ORDER — CARVEDILOL 6.25 MG PO TABS: 6.2500 mg | ORAL_TABLET | Freq: Two times a day (BID) | ORAL | 3 refills | Status: DC

## 2016-06-08 NOTE — Telephone Encounter (Signed)
Pt walk-in because he called yesterday for a refill on carvedilol medication which dose was increased from 3.125 mg to 6.25 mg twice daily. Pt states that he was tolled by  the  person in refills to call the pharmacy, which he had already had contacted before. Pt  would like for this office to  called   a prescription for 6.25 mg dose  for him to take one 6.25 mg tablet instead of taking 2  3.125 mg dose twice daily. Information was given to [facility] LPN. Also linda is aware that  the insurance needs to be called for medication approval, because the pharmacy states that the insurance will approved this medication change until august 5th. Pt states he already messed a few doses and he cant wait till then.

## 2016-06-24 ENCOUNTER — Encounter: Payer: Self-pay | Admitting: Nurse Practitioner

## 2016-06-24 ENCOUNTER — Encounter: Payer: Self-pay | Admitting: Cardiovascular Disease

## 2016-06-24 ENCOUNTER — Ambulatory Visit (INDEPENDENT_AMBULATORY_CARE_PROVIDER_SITE_OTHER): Payer: Medicare Other | Admitting: Cardiovascular Disease

## 2016-06-24 VITALS — BP 140/90 | HR 78 | Ht 75.0 in | Wt 211.4 lb

## 2016-06-24 DIAGNOSIS — I421 Obstructive hypertrophic cardiomyopathy: Secondary | ICD-10-CM | POA: Diagnosis not present

## 2016-06-24 HISTORY — DX: Obstructive hypertrophic cardiomyopathy: I42.1

## 2016-06-24 MED ORDER — CARVEDILOL 12.5 MG PO TABS: 12.5000 mg | ORAL_TABLET | Freq: Two times a day (BID) | ORAL | 11 refills | Status: DC

## 2016-06-24 NOTE — Patient Instructions (Signed)
Medication Instructions:  INCREASE Coreg (Carvedilol) to 12.5 mg twice daily   Labwork: None Ordered   Testing/Procedures: None Ordered   Follow-Up: Your physician recommends that you schedule a follow-up appointment in: 3 months with Dr. Acie Fredrickson.    If you need a refill on your cardiac medications before your next appointment, please call your pharmacy.   Thank you for choosing [facility]! Christen Bame, RN [phone]

## 2016-06-24 NOTE — Progress Notes (Signed)
Cardiology Office Note   Date:  06/24/2016   ID:  Michael Barton, DOB May 05, 1946, MRN [id number]  PCP:  Simona Huh, MD  Cardiologist:   Mertie Moores, MD   No chief complaint on file.  1. Shortness of breath 2. Hyperlipidemia 3. Obstructive sleep apnea 4. Depression    History of Present Illness: Michael Barton is a 70 y.o. male who presents for DOE Was an avid runner and cyclist until 5 years ago. Is not able to run or cycle recently - has been a steady decline  He can now run for 1 minute and then walk for 2-3   He has noticed a definint decline in his abiltiy - now gets short of breath doing everyday activities. He become short of breath climbing stairs and walking from his office out to the car.  He denies any chest pain. He has no shortness of breath at rest. He's had an echo card gram the past which reveals left ventricular hypertrophy.  He work at an [employer] firm    Apr 01, 2016:  Assael was seen last month with episodes of shortness of breath with exertion. A stress Myoview study showed no ischemia. His ejection fraction was 58%.  [name] has not improved.   Has not had his echo yet.   Aug. 18, 2017: [name] is seen for his dyspnea with exertion .    Myoview Mar 28, 2016 - no ischemia.  EF 58% Echo - June 2017:   Normal LV dysfunction .   Mild dynamic LV obstruction   Cardiopulmonary stress test:    Overall normal function capacity  He has been started on Coreg - has been increased to 6.25 BID   Can walk and cycle witout problems Has DOE with running     Past Medical History:  Diagnosis Date  . Depression   . Hyperlipidemia   . Sleep apnea   . Testicular cancer [facility])     Past Surgical History:  Procedure Laterality Date  . ORCHIECTOMY       Current Outpatient Prescriptions  Medication Sig Dispense Refill  . ANDROGEL PUMP 20.25 MG/ACT (1.62%) GEL Apply 1 application topically daily. 1 APPLICATION UNDER EACH ARM DAILY    .  buPROPion (WELLBUTRIN) 100 MG tablet Take 100 mg by mouth 2 (two) times daily.    . carvedilol (COREG) 6.25 MG tablet Take 1 tablet (6.25 mg total) by mouth 2 (two) times daily. 180 tablet 3  . lamoTRIgine (LAMICTAL) 200 MG tablet Take 200 mg by mouth daily.    . naproxen sodium (ANAPROX) 220 MG tablet Take 220 mg by mouth 2 (two) times daily with a meal.    . pravastatin (PRAVACHOL) 40 MG tablet Take 40 mg by mouth daily.    . QUEtiapine (SEROQUEL) 200 MG tablet Take 200 mg by mouth at bedtime.    Marland Kitchen venlafaxine XR (EFFEXOR-XR) 150 MG 24 hr capsule Take 150 mg by mouth daily with breakfast.     No current facility-administered medications for this visit.     Allergies:   Ambien [zolpidem tartrate] and Prozac [fluoxetine hcl]    Social History:  The patient  reports that he has never smoked. He does not have any smokeless tobacco history on file. He reports that he does not drink alcohol or use drugs.   Family History:  The patient's family history includes Cancer in his father; Depression in his mother; Thyroid disease in his mother.    ROS:  Please see the history of present illness.    Review of Systems: Constitutional:  denies fever, chills, diaphoresis, appetite change and fatigue.  HEENT: denies photophobia, eye pain, redness, hearing loss, ear pain, congestion, sore throat, rhinorrhea, sneezing, neck pain, neck stiffness and tinnitus.  Respiratory: admits to SOB, DOE,    Denies cough, chest tightness, and wheezing.  Cardiovascular: denies chest pain, palpitations and leg swelling.  Gastrointestinal: denies nausea, vomiting, abdominal pain, diarrhea, constipation, blood in stool.  Genitourinary: denies dysuria, urgency, frequency, hematuria, flank pain and difficulty urinating.  Musculoskeletal: denies  myalgias, back pain, joint swelling, arthralgias and gait problem.   Skin: denies pallor, rash and wound.  Neurological: denies dizziness, seizures, syncope, weakness,  light-headedness, numbness and headaches.   Hematological: denies adenopathy, easy bruising, personal or family bleeding history.  Psychiatric/ Behavioral: denies suicidal ideation, mood changes, confusion, nervousness, sleep disturbance and agitation.       All other systems are reviewed and negative.    PHYSICAL EXAM: VS:  BP 140/90 (BP Location: Right Arm, Patient Position: Sitting, Cuff Size: Normal)   Pulse 78   Ht 6\' 3"  (1.905 m)   Wt 211 lb 6.4 oz (95.9 kg)   BMI 26.42 kg/m  , BMI Body mass index is 26.42 kg/m. GEN: Well nourished, well developed, in no acute distress  HEENT: normal  Neck: no JVD, carotid bruits, or masses Cardiac: RRR;  Harsh systolic outflow murmur   No , rubs, or gallops,no edema  Respiratory:  clear to auscultation bilaterally, normal work of breathing GI: soft, nontender, nondistended, + BS MS: no deformity or atrophy  Skin: warm and dry, no rash Neuro:  Strength and sensation are intact Psych: normal   EKG:  EKG is ordered today. The ekg ordered today demonstrates  NSR at 68.   TWI in lateral leads ( leads 1 and aVL)    Recent Labs: No results found for requested labs within last 8760 hours.    Lipid Panel No results found for: CHOL, TRIG, HDL, CHOLHDL, VLDL, LDLCALC, LDLDIRECT    Wt Readings from Last 3 Encounters:  06/24/16 211 lb 6.4 oz (95.9 kg)  04/01/16 212 lb 12.8 oz (96.5 kg)  02/23/16 209 lb 9.6 oz (95.1 kg)      Other studies Reviewed: Additional studies/ records that were reviewed today include: . Review of the above records demonstrates:    ASSESSMENT AND PLAN:  1.  Shortness of breath with exertion: [name] has been an avid runner and cyclist for years but has noticed a recent deteriation in his endurance. Stress myoview showed no ischemia.    Echocardiogram showed a dynamic LVOT obstruction. We have started him on on carvedilol that he has not noticed much improvement. We will increase the carvedilol to 12.5 g twice a  day. I've encouraged him to concentrate more on cycling and less on running. He seems to be to do better when he cycling or backpacking. Most of his troubles occur when he is running.  He will record BP and HR readings and send them in on MyChart.   Current medicines are reviewed at length with the patient today.  The patient does not have concerns regarding medicines.  The following changes have been made:  no change  Labs/ tests ordered today include:   No orders of the defined types were placed in this encounter.   Disposition:   FU with me in 6  months     Mertie Moores, MD  06/24/2016 3:11 PM  [facility] [place], [place], [place]  [address] Phone: [phone]; Fax: [phone]   [facility]  [address] [place], [place]  [address] [phone]   Fax [phone]

## 2016-07-18 DIAGNOSIS — Z79899 Other long term (current) drug therapy: Secondary | ICD-10-CM | POA: Diagnosis not present

## 2016-07-18 DIAGNOSIS — F319 Bipolar disorder, unspecified: Secondary | ICD-10-CM | POA: Diagnosis not present

## 2016-08-01 ENCOUNTER — Encounter: Payer: Self-pay | Admitting: Cardiovascular Disease

## 2016-08-01 DIAGNOSIS — F322 Major depressive disorder, single episode, severe without psychotic features: Secondary | ICD-10-CM | POA: Diagnosis not present

## 2016-08-01 DIAGNOSIS — E291 Testicular hypofunction: Secondary | ICD-10-CM | POA: Diagnosis not present

## 2016-08-01 DIAGNOSIS — I421 Obstructive hypertrophic cardiomyopathy: Secondary | ICD-10-CM | POA: Diagnosis not present

## 2016-08-01 DIAGNOSIS — Z23 Encounter for immunization: Secondary | ICD-10-CM | POA: Diagnosis not present

## 2016-08-01 DIAGNOSIS — E7800 Pure hypercholesterolemia, unspecified: Secondary | ICD-10-CM | POA: Diagnosis not present

## 2016-08-01 DIAGNOSIS — G4733 Obstructive sleep apnea (adult) (pediatric): Secondary | ICD-10-CM | POA: Diagnosis not present

## 2016-08-01 DIAGNOSIS — Z79899 Other long term (current) drug therapy: Secondary | ICD-10-CM | POA: Diagnosis not present

## 2016-08-01 DIAGNOSIS — N183 Chronic kidney disease, stage 3 (moderate): Secondary | ICD-10-CM | POA: Diagnosis not present

## 2016-09-16 DIAGNOSIS — H25093 Other age-related incipient cataract, bilateral: Secondary | ICD-10-CM | POA: Diagnosis not present

## 2016-09-16 DIAGNOSIS — H5213 Myopia, bilateral: Secondary | ICD-10-CM | POA: Diagnosis not present

## 2016-10-03 ENCOUNTER — Ambulatory Visit (INDEPENDENT_AMBULATORY_CARE_PROVIDER_SITE_OTHER): Payer: Medicare Other | Admitting: Cardiovascular Disease

## 2016-10-03 ENCOUNTER — Encounter: Payer: Self-pay | Admitting: Cardiovascular Disease

## 2016-10-03 VITALS — BP 128/68 | HR 68 | Ht 75.0 in | Wt 215.6 lb

## 2016-10-03 DIAGNOSIS — R0609 Other forms of dyspnea: Secondary | ICD-10-CM

## 2016-10-03 DIAGNOSIS — I421 Obstructive hypertrophic cardiomyopathy: Secondary | ICD-10-CM

## 2016-10-03 MED ORDER — METOPROLOL TARTRATE 50 MG PO TABS: 50.0000 mg | ORAL_TABLET | Freq: Two times a day (BID) | ORAL | 3 refills | Status: DC

## 2016-10-03 NOTE — Patient Instructions (Addendum)
Medication Instructions:  STOP Carvedilol (Coreg) START Metoprolol (Lopressor) 50 mg twice daily  Labwork: None Ordered   Testing/Procedures: None Ordered   Follow-Up: Your physician recommends that you schedule a follow-up appointment in: 3 months with Dr. Acie Fredrickson   If you need a refill on your cardiac medications before your next appointment, please call your pharmacy.   Thank you for choosing [facility]! Christen Bame, RN [phone]

## 2016-10-03 NOTE — Progress Notes (Signed)
Cardiology Office Note   Date:  10/03/2016   ID:  Michael Barton, DOB [birth year], MRN [id number]  PCP:  Simona Huh, MD  Cardiologist:   Mertie Moores, MD   Chief Complaint  Patient presents with  . Follow-up    HOCM    1. Shortness of breath 2. Hyperlipidemia 3. Obstructive sleep apnea 4. Depression    History of Present Illness: Michael Barton is a 70 y.o. male who presents for DOE Was an avid runner and cyclist until 5 years ago. Is not able to run or cycle recently - has been a steady decline  He can now run for 1 minute and then walk for 2-3   He has noticed a definint decline in his abiltiy - now gets short of breath doing everyday activities. He become short of breath climbing stairs and walking from his office out to the car.  He denies any chest pain. He has no shortness of breath at rest. He's had an echo card gram the past which reveals left ventricular hypertrophy.  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 A stress Myoview study showed no ischemia. His ejection fraction was 58%.  Michael Barton has not improved.   Has not had his echo yet.   Aug. 18, 2017: Michael Barton is seen for his dyspnea with exertion .    Myoview Mar 28, 2016 - no ischemia.  EF 58% Echo - June 2017:   Normal LV dysfunction .   Mild dynamic LV obstruction   Cardiopulmonary stress test:    Overall normal function capacity  He has been started on Coreg - has been increased to 6.25 BID   Can walk and cycle witout problems Has DOE with running    Nov. 27, 2017:  Michael Barton Is seen back for follow-up office visit. He had an echocardiogram which reveals a  Slight dynamic obstruction with LVH.   We increased his Coreg to 6.25 BID , And he has not noticed any significant improvement in his exertional dyspnea.   Past Medical History:  Diagnosis Date  . Depression   . Hyperlipidemia   . Sleep  apnea   . Testicular cancer [facility])     Past Surgical History:  Procedure Laterality Date  . ORCHIECTOMY       Current Outpatient Prescriptions  Medication Sig Dispense Refill  . ANDROGEL PUMP 20.25 MG/ACT (1.62%) GEL Apply 1 application topically daily. 1 APPLICATION UNDER EACH ARM DAILY    . buPROPion (WELLBUTRIN) 100 MG tablet Take 100 mg by mouth 2 (two) times daily.    . carvedilol (COREG) 12.5 MG tablet Take 1 tablet (12.5 mg total) by mouth 2 (two) times daily. 60 tablet 11  . lamoTRIgine (LAMICTAL) 200 MG tablet Take 200 mg by mouth daily.    . naproxen sodium (ANAPROX) 220 MG tablet Take 220 mg by mouth 2 (two) times daily with a meal.    . pravastatin (PRAVACHOL) 40 MG tablet Take 40 mg by mouth daily.    . QUEtiapine (SEROQUEL) 200 MG tablet Take 200 mg by mouth at bedtime.    Marland Kitchen venlafaxine XR (EFFEXOR-XR) 150 MG 24 hr capsule Take 150 mg by mouth daily with breakfast.     No current facility-administered medications for this visit.     Allergies:   Ambien [zolpidem tartrate] and Prozac [fluoxetine hcl]    Social History:  The patient  reports that he has never smoked. He does not have any smokeless tobacco history on file. He reports that he does not drink alcohol or use drugs.   Family History:  The patient's family history includes Cancer in his father; Depression in his mother; Thyroid disease in his mother.    ROS:  Please see the history of present illness.    Review of Systems: Constitutional:  denies fever, chills, diaphoresis, appetite change and fatigue.  HEENT: denies photophobia, eye pain, redness, hearing loss, ear pain, congestion, sore throat, rhinorrhea, sneezing, neck pain, neck stiffness and tinnitus.  Respiratory: admits to SOB, DOE,    Denies cough, chest tightness, and wheezing.  Cardiovascular: denies chest pain, palpitations and leg swelling.  Gastrointestinal: denies nausea, vomiting, abdominal pain, diarrhea, constipation, blood in stool.    Genitourinary: denies dysuria, urgency, frequency, hematuria, flank pain and difficulty urinating.  Musculoskeletal: denies  myalgias, back pain, joint swelling, arthralgias and gait problem.   Skin: denies pallor, rash and wound.  Neurological: denies dizziness, seizures, syncope, weakness, light-headedness, numbness and headaches.   Hematological: denies adenopathy, easy bruising, personal or family bleeding history.  Psychiatric/ Behavioral: denies suicidal ideation, mood changes, confusion, nervousness, sleep disturbance and agitation.       All other systems are reviewed and negative.    PHYSICAL EXAM: VS:  BP 128/68 (BP Location: Right Arm, Patient Position: Sitting, Cuff Size: Large)   Pulse 68   Ht 6\' 3"  (1.905 m)   Wt 215 lb 9.6 oz (97.8 kg)   BMI 26.95 kg/m  , BMI Body mass index is 26.95 kg/m. GEN: Well nourished, well developed, in no acute distress  HEENT: normal  Neck: no JVD, carotid bruits, or masses Cardiac: RRR;  Harsh systolic outflow murmur   No , rubs, or gallops,no edema  Respiratory:  clear to auscultation bilaterally, normal work of breathing GI: soft, nontender, nondistended, + BS MS: no deformity or atrophy  Skin: warm and dry, no rash Neuro:  Strength and sensation are intact Psych: normal   EKG:  EKG is ordered today. The ekg ordered today demonstrates  NSR at 68.   TWI in lateral leads ( leads 1 and aVL)    Recent Labs: No results found for requested labs within last 8760 hours.    Lipid Panel No results found for: CHOL, TRIG, HDL, CHOLHDL, VLDL, LDLCALC, LDLDIRECT    Wt Readings from Last 3 Encounters:  10/03/16 215 lb 9.6 oz (97.8 kg)  06/24/16 211 lb 6.4 oz (95.9 kg)  04/01/16 212 lb 12.8 oz (96.5 kg)      Other studies Reviewed: Additional studies/ records that were reviewed today include: . Review of the above records demonstrates:    ASSESSMENT AND PLAN:  1.  Dynamic left ventricular outflow tract obstruction - Shortness  of breath with exertion:  Michael Barton remains very active but clearly has shortness of breath with exertion. Echocardiogram reveals normal/supranormal left ventricle systolic function. He does have a dynamic LVOT obstruction. He tried him on slightly higher doses of carvedilol but this did not seem to help.  We'll switch to metoprolol 50 mg twice a day to see if this will help relieve the dynamic obstruction better than the carvedilol. I'll see him back in 3 months for follow-up evaluation.  Current medicines are reviewed at length with the patient today.  The patient does not have concerns regarding medicines.  The following changes have been made:  no change  Labs/ tests ordered today include:  No orders of the defined types were placed in this encounter.   Mertie Moores, MD  10/03/2016 3:20 PM    [facility] [place], [place], [place]  [address] Phone: [phone]; Fax: [phone]

## 2016-11-22 DIAGNOSIS — E291 Testicular hypofunction: Secondary | ICD-10-CM | POA: Diagnosis not present

## 2017-01-02 ENCOUNTER — Ambulatory Visit (INDEPENDENT_AMBULATORY_CARE_PROVIDER_SITE_OTHER): Payer: Medicare Other | Admitting: Cardiovascular Disease

## 2017-01-02 ENCOUNTER — Encounter: Payer: Self-pay | Admitting: Cardiovascular Disease

## 2017-01-02 VITALS — BP 116/70 | HR 56 | Ht 75.0 in | Wt 214.1 lb

## 2017-01-02 DIAGNOSIS — R0602 Shortness of breath: Secondary | ICD-10-CM

## 2017-01-02 DIAGNOSIS — I421 Obstructive hypertrophic cardiomyopathy: Secondary | ICD-10-CM

## 2017-01-02 NOTE — Patient Instructions (Signed)
Medication Instructions:  Your physician recommends that you continue on your current medications as directed. Please refer to the Current Medication list given to you today.   Labwork: None Ordered   Testing/Procedures: Your physician has requested that you have a cardiac MRI. Cardiac MRI uses a computer to create images of your heart as its beating, producing both still and moving pictures of your heart and major blood vessels. For further information please visit [email or web address] Please follow the instruction sheet given to you today for more information.   Follow-Up: You have been referred to [facility] at [place] physician recommends that you schedule a follow-up appointment in: as needed with Dr. Acie Fredrickson   If you need a refill on your cardiac medications before your next appointment, please call your pharmacy.   Thank you for choosing [facility]! Christen Bame, RN [phone]

## 2017-01-02 NOTE — Progress Notes (Signed)
Cardiology Office Note   Date:  01/02/2017   ID:  MIR AMBER, DOB 1946/03/22, MRN [id number]  PCP:  Simona Huh, MD  Cardiologist:   Mertie Moores, MD   Chief Complaint  Patient presents with  . Follow-up   1. Shortness of breath 2. Hyperlipidemia 3. Obstructive sleep apnea 4. Depression    History of Present Illness: Michael Barton is a 71 y.o. male who presents for DOE Was an avid runner and cyclist until 5 years ago. Is not able to run or cycle recently - has been a steady decline  He can now run for 1 minute and then walk for 2-3   He has noticed a definint decline in his abiltiy - now gets short of breath doing everyday activities. He become short of breath climbing stairs and walking from his office out to the car.  He denies any chest pain. He has no shortness of breath at rest. He's had an echo card gram the past which reveals left ventricular hypertrophy.  He work at an [employer]    Apr 01, 2016:  Micky was seen last month with episodes of shortness of breath with exertion. A stress Myoview study showed no ischemia. His ejection fraction was 58%.  [place] has not improved.   Has not had his echo yet.   Aug. 18, 2017: Michael Barton is seen for his dyspnea with exertion .    Myoview Mar 28, 2016 - no ischemia.  EF 58% Echo - June 2017:   Normal LV dysfunction .   Mild dynamic LV obstruction   Cardiopulmonary stress test:    Overall normal function capacity  He has been started on Coreg - has been increased to 6.25 BID   Can walk and cycle witout problems Has DOE with running    Nov. 27, 2017:  Michael Barton Is seen back for follow-up office visit. He had an echocardiogram which reveals a  Slight dynamic obstruction with LVH.   We increased his Coreg to 6.25 BID , And he has not noticed any significant improvement in his exertional dyspnea.  Feb. 26,  2018  Michael Barton is slightly worse. Has some symptoms or orthostasis.  The change from Coreg  to Metoprolol did not help  Still hiking - especially in the warmer months    Past Medical History:  Diagnosis Date  . Depression   . Hyperlipidemia   . Sleep apnea   . Testicular cancer [facility])     Past Surgical History:  Procedure Laterality Date  . ORCHIECTOMY       Current Outpatient Prescriptions  Medication Sig Dispense Refill  . ANDROGEL PUMP 20.25 MG/ACT (1.62%) GEL Apply 1 application topically daily. 1 APPLICATION UNDER EACH ARM DAILY    . buPROPion (WELLBUTRIN) 100 MG tablet Take 100 mg by mouth 2 (two) times daily.    Marland Kitchen lamoTRIgine (LAMICTAL) 200 MG tablet Take 200 mg by mouth daily.    . metoprolol (LOPRESSOR) 50 MG tablet Take 1 tablet (50 mg total) by mouth 2 (two) times daily. 180 tablet 3  . naproxen sodium (ANAPROX) 220 MG tablet Take 220 mg by mouth 2 (two) times daily with a meal.    . pravastatin (PRAVACHOL) 40 MG tablet Take 40 mg by mouth daily.    . QUEtiapine (SEROQUEL) 200 MG tablet Take 200 mg by mouth at bedtime.    Marland Kitchen venlafaxine XR (EFFEXOR-XR) 150 MG 24 hr capsule Take 150 mg by mouth daily with breakfast.  No current facility-administered medications for this visit.     Allergies:   Ambien [zolpidem tartrate] and Prozac [fluoxetine hcl]    Social History:  The patient  reports that he has never smoked. He has never used smokeless tobacco. He reports that he does not drink alcohol or use drugs.   Family History:  The patient's family history includes Cancer in his father; Depression in his mother; Thyroid disease in his mother.    ROS:  Please see the history of present illness.    Review of Systems: Constitutional:  denies fever, chills, diaphoresis, appetite change and fatigue.  HEENT: denies photophobia, eye pain, redness, hearing loss, ear pain, congestion, sore throat, rhinorrhea, sneezing, neck pain, neck stiffness and tinnitus.  Respiratory: admits to SOB, DOE,    Denies cough, chest tightness, and wheezing.  Cardiovascular: denies  chest pain, palpitations and leg swelling.  Gastrointestinal: denies nausea, vomiting, abdominal pain, diarrhea, constipation, blood in stool.  Genitourinary: denies dysuria, urgency, frequency, hematuria, flank pain and difficulty urinating.  Musculoskeletal: denies  myalgias, back pain, joint swelling, arthralgias and gait problem.   Skin: denies pallor, rash and wound.  Neurological: denies dizziness, seizures, syncope, weakness, light-headedness, numbness and headaches.   Hematological: denies adenopathy, easy bruising, personal or family bleeding history.  Psychiatric/ Behavioral: denies suicidal ideation, mood changes, confusion, nervousness, sleep disturbance and agitation.       All other systems are reviewed and negative.    PHYSICAL EXAM: VS:  BP 116/70 (BP Location: Left Arm, Patient Position: Sitting, Cuff Size: Normal)   Pulse (!) 56   Ht 6\' 3"  (1.905 m)   Wt 214 lb 1.9 oz (97.1 kg)   SpO2 94%   BMI 26.76 kg/m  , BMI Body mass index is 26.76 kg/m. GEN: Well nourished, well developed, in no acute distress  HEENT: normal  Neck: no JVD, carotid bruits, or masses Cardiac: RRR;  Harsh systolic outflow murmur   No , rubs, or gallops,no edema  Respiratory:  clear to auscultation bilaterally, normal work of breathing GI: soft, nontender, nondistended, + BS MS: no deformity or atrophy  Skin: warm and dry, no rash Neuro:  Strength and sensation are intact Psych: normal   EKG:  EKG is ordered today. The ekg ordered today demonstrates  Sinus brady at 56.   T wave abnormality     Recent Labs: No results found for requested labs within last 8760 hours.    Lipid Panel No results found for: CHOL, TRIG, HDL, CHOLHDL, VLDL, LDLCALC, LDLDIRECT    Wt Readings from Last 3 Encounters:  01/02/17 214 lb 1.9 oz (97.1 kg)  10/03/16 215 lb 9.6 oz (97.8 kg)  06/24/16 211 lb 6.4 oz (95.9 kg)      Other studies Reviewed: Additional studies/ records that were reviewed today  include: . Review of the above records demonstrates:    ASSESSMENT AND PLAN:  1.  Dynamic left ventricular outflow tract obstruction - Shortness of breath with exertion:  Michael Barton remains very active but clearly has shortness of breath with exertion. Echocardiogram reveals normal/supranormal left ventricle systolic function. He does have a dynamic LVOT obstruction. He tried him on slightly higher doses of carvedilol but this did not seem to help.  We then switched to metoprolol but this did not or for any additional help. He still is severely limited.  At this point we'll get a cardiac MRI. We will refer him to the advanced heart failure clinic for further evaluation and management.  I will  see him on an as needed basis   Current medicines are reviewed at length with the patient today.  The patient does not have concerns regarding medicines.  The following changes have been made:  no change  Labs/ tests ordered today include:   No orders of the defined types were placed in this encounter.   Mertie Moores, MD  01/02/2017 4:02 PM    [facility] [address], [place], [place]  [address] Phone: [phone]; Fax: [phone]

## 2017-01-05 ENCOUNTER — Encounter: Payer: Self-pay | Admitting: Cardiovascular Disease

## 2017-01-05 ENCOUNTER — Telehealth: Payer: Self-pay | Admitting: Cardiovascular Disease

## 2017-01-05 NOTE — Telephone Encounter (Signed)
Called the patient and let him know the date, time and location of cardiac MRI.  Letter mailed to the patient today.

## 2017-01-16 ENCOUNTER — Ambulatory Visit (HOSPITAL_COMMUNITY)
Admission: RE | Admit: 2017-01-16 | Discharge: 2017-01-16 | Disposition: A | Discharge status: Home / Self Care | Payer: Medicare Other | Source: Ambulatory Visit | Attending: Cardiovascular Disease | Admitting: Cardiovascular Disease

## 2017-01-16 DIAGNOSIS — I422 Other hypertrophic cardiomyopathy: Secondary | ICD-10-CM | POA: Diagnosis not present

## 2017-01-16 DIAGNOSIS — I340 Nonrheumatic mitral (valve) insufficiency: Secondary | ICD-10-CM | POA: Diagnosis not present

## 2017-01-16 DIAGNOSIS — R0602 Shortness of breath: Secondary | ICD-10-CM | POA: Diagnosis not present

## 2017-01-16 DIAGNOSIS — I517 Cardiomegaly: Secondary | ICD-10-CM | POA: Insufficient documentation

## 2017-01-16 DIAGNOSIS — I421 Obstructive hypertrophic cardiomyopathy: Secondary | ICD-10-CM | POA: Insufficient documentation

## 2017-01-16 LAB — CREATININE, SERUM: CREATININE: 1.18 mg/dL (ref 0.61–1.24)

## 2017-01-16 MED ORDER — GADOBENATE DIMEGLUMINE 529 MG/ML IV SOLN
33.0000 mL | Freq: Once | INTRAVENOUS | Status: AC | PRN
  Administered 2017-01-16: 33 mL via INTRAVENOUS

## 2017-01-19 ENCOUNTER — Telehealth: Payer: Self-pay | Admitting: Nurse Practitioner

## 2017-01-19 NOTE — Telephone Encounter (Signed)
I have reviewed the MRI.  Michael Barton will need a cardiac cath.   He is on max dose beta blocker He may need myomectomy I have called and left a message for him to call me back .  Will arrange for a Right and Left heart cath

## 2017-01-19 NOTE — Telephone Encounter (Signed)
-----   Message from Thayer Headings, MD sent at 01/18/2017  4:22 PM EDT ----- Will defer to Dr. Aundra Dubin for further management  He may need to go for myomectomy

## 2017-01-23 DIAGNOSIS — F3170 Bipolar disorder, currently in remission, most recent episode unspecified: Secondary | ICD-10-CM | POA: Diagnosis not present

## 2017-01-23 DIAGNOSIS — Z79899 Other long term (current) drug therapy: Secondary | ICD-10-CM | POA: Diagnosis not present

## 2017-01-25 MED ORDER — METOPROLOL TARTRATE 25 MG PO TABS: 25.0000 mg | ORAL_TABLET | Freq: Two times a day (BID) | ORAL | 11 refills | Status: DC

## 2017-01-25 NOTE — Telephone Encounter (Signed)
Spoke with patient to review MRI results and Dr. Elmarie Shiley advice. Patient states he stopped metoprolol  a few days prior to the MRI. He states he thought he was supposed to stop it since he was referred to HF clinic. I advised that Dr. Acie Fredrickson thought he was on maximum beta blocker therapy and was continuing to have symptoms. I advised that per Dr. Acie Fredrickson who is in the office, he should restart metoprolol at 25 mg twice daily and return to our office for f/u with Dr. Acie Fredrickson in approximately 4 weeks. I cancelled his appointment on 3/30 with HF clinic and scheduled him with Dr. Acie Fredrickson on 4/25. I advised him to call back sooner with questions or concerns. He verbalized understanding and agreement with plan.

## 2017-02-03 ENCOUNTER — Encounter (HOSPITAL_COMMUNITY): Payer: Medicare Other

## 2017-02-10 ENCOUNTER — Encounter: Payer: Self-pay | Admitting: Cardiovascular Disease

## 2017-02-24 DIAGNOSIS — E291 Testicular hypofunction: Secondary | ICD-10-CM | POA: Diagnosis not present

## 2017-02-24 DIAGNOSIS — R948 Abnormal results of function studies of other organs and systems: Secondary | ICD-10-CM | POA: Diagnosis not present

## 2017-03-01 ENCOUNTER — Encounter: Payer: Self-pay | Admitting: Cardiovascular Disease

## 2017-03-01 ENCOUNTER — Ambulatory Visit (INDEPENDENT_AMBULATORY_CARE_PROVIDER_SITE_OTHER): Payer: Medicare Other | Admitting: Cardiovascular Disease

## 2017-03-01 VITALS — BP 138/74 | HR 78 | Ht 75.0 in | Wt 210.0 lb

## 2017-03-01 DIAGNOSIS — I421 Obstructive hypertrophic cardiomyopathy: Secondary | ICD-10-CM | POA: Diagnosis not present

## 2017-03-01 MED ORDER — METOPROLOL TARTRATE 50 MG PO TABS: 50.0000 mg | ORAL_TABLET | Freq: Two times a day (BID) | ORAL | 3 refills | Status: DC

## 2017-03-01 NOTE — Patient Instructions (Signed)
Medication Instructions:  INCREASE Metoprolol to 50 mg twice daily   Labwork: None Ordered   Testing/Procedures: None Ordered   Follow-Up: Your physician recommends that you schedule a follow-up appointment in: 1 month with Dr. Acie Fredrickson   If you need a refill on your cardiac medications before your next appointment, please call your pharmacy.   Thank you for choosing [facility]! Christen Bame, RN [phone]

## 2017-03-01 NOTE — Progress Notes (Signed)
Cardiology Office Note   Date:  03/01/2017   ID:  Michael Barton, DOB 1946/07/13, MRN [id number]  PCP:  Simona Huh, MD  Cardiologist:   Mertie Moores, MD   Chief Complaint  Patient presents with  . Follow-up    HOCM   1. Shortness of breath 2. Hyperlipidemia 3. Obstructive sleep apnea 4. Depression    History of Present Illness: Michael Barton is a 71 y.o. male who presents for DOE Was an avid runner and cyclist until 5 years ago. Is not able to run or cycle recently - has been a steady decline  He can now run for 1 minute and then walk for 2-3   He has noticed a definint decline in his abiltiy - now gets short of breath doing everyday activities. He become short of breath climbing stairs and walking from his office out to the car.  He denies any chest pain. He has no shortness of breath at rest. He's had an echo card gram the past which reveals left ventricular hypertrophy.  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 A stress Myoview study showed no ischemia. His ejection fraction was 58%.  Michael Barton has not improved.   Has not had his echo yet.   Aug. 18, 2017: Michael Barton is seen for his dyspnea with exertion .    Myoview Mar 28, 2016 - no ischemia.  EF 58% Echo - June 2017:   Normal LV dysfunction .   Mild dynamic LV obstruction   Cardiopulmonary stress test:    Overall normal function capacity  He has been started on Coreg - has been increased to 6.25 BID   Can walk and cycle witout problems Has DOE with running    Nov. 27, 2017:  Michael Barton Is seen back for follow-up office visit. He had an echocardiogram which reveals a  Slight dynamic obstruction with LVH.   We increased his Coreg to 6.25 BID , And he has not noticed any significant improvement in his exertional dyspnea.  Feb. 26,  2018  Michael Barton is slightly worse. Has some symptoms or orthostasis.  The change  from Coreg to Metoprolol did not help  Still hiking - especially in the warmer months   March 01, 2017:   Michael Barton is seen back today  He had stopped the metoprolol prior to getting the MRI.  Still cant walk very far without getting severe shortness of breath .     Past Medical History:  Diagnosis Date  . Depression   . Hyperlipidemia   . Sleep apnea   . Testicular cancer [facility])     Past Surgical History:  Procedure Laterality Date  . ORCHIECTOMY       Current Outpatient Prescriptions  Medication Sig Dispense Refill  . ANDROGEL PUMP 20.25 MG/ACT (1.62%) GEL Apply 1 application topically daily. 1 APPLICATION UNDER EACH ARM DAILY    . buPROPion (WELLBUTRIN) 100 MG tablet Take 100 mg by mouth 2 (two) times daily.    Marland Kitchen lamoTRIgine (LAMICTAL) 200 MG tablet Take 200 mg by mouth daily.    . metoprolol tartrate (LOPRESSOR) 25 MG tablet Take 1 tablet (25 mg total) by mouth 2 (two) times daily. 60 tablet 11  . naproxen sodium (ANAPROX) 220 MG tablet Take 220 mg by mouth 2 (two) times daily with a meal.    . pravastatin (PRAVACHOL) 40 MG tablet Take  40 mg by mouth daily.    . QUEtiapine (SEROQUEL) 200 MG tablet Take 200 mg by mouth at bedtime.    Marland Kitchen venlafaxine XR (EFFEXOR-XR) 150 MG 24 hr capsule Take 150 mg by mouth daily with breakfast.     No current facility-administered medications for this visit.     Allergies:   Ambien [zolpidem tartrate] and Prozac [fluoxetine hcl]    Social History:  The patient  reports that he has never smoked. He has never used smokeless tobacco. He reports that he does not drink alcohol or use drugs.   Family History:  The patient's family history includes Cancer in his father; Depression in his mother; Thyroid disease in his mother.    ROS:  Please see the history of present illness.    Review of Systems: Constitutional:  denies fever, chills, diaphoresis, appetite change and fatigue.  HEENT: denies photophobia, eye pain, redness, hearing loss, ear  pain, congestion, sore throat, rhinorrhea, sneezing, neck pain, neck stiffness and tinnitus.  Respiratory: admits to SOB, DOE,    Denies cough, chest tightness, and wheezing.  Cardiovascular: denies chest pain, palpitations and leg swelling.  Gastrointestinal: denies nausea, vomiting, abdominal pain, diarrhea, constipation, blood in stool.  Genitourinary: denies dysuria, urgency, frequency, hematuria, flank pain and difficulty urinating.  Musculoskeletal: denies  myalgias, back pain, joint swelling, arthralgias and gait problem.   Skin: denies pallor, rash and wound.  Neurological: denies dizziness, seizures, syncope, weakness, light-headedness, numbness and headaches.   Hematological: denies adenopathy, easy bruising, personal or family bleeding history.  Psychiatric/ Behavioral: denies suicidal ideation, mood changes, confusion, nervousness, sleep disturbance and agitation.       All other systems are reviewed and negative.    PHYSICAL EXAM: VS:  BP 138/74 (BP Location: Left Arm, Patient Position: Sitting, Cuff Size: Normal)   Pulse 78   Ht 6\' 3"  (1.905 m)   Wt 210 lb (95.3 kg)   SpO2 95%   BMI 26.25 kg/m  , BMI Body mass index is 26.25 kg/m. GEN: Well nourished, well developed, in no acute distress  HEENT: normal  Neck: no JVD, carotid bruits, or masses Cardiac: RRR;  Harsh systolic outflow murmur   No , rubs, or gallops,no edema  Respiratory:  clear to auscultation bilaterally, normal work of breathing GI: soft, nontender, nondistended, + BS MS: no deformity or atrophy  Skin: warm and dry, no rash Neuro:  Strength and sensation are intact Psych: normal   EKG:  EKG is ordered today. The ekg ordered today demonstrates  Sinus brady at 56.   T wave abnormality     Recent Labs: 01/16/2017: Creatinine, Ser 1.18    Lipid Panel No results found for: CHOL, TRIG, HDL, CHOLHDL, VLDL, LDLCALC, LDLDIRECT    Wt Readings from Last 3 Encounters:  03/01/17 210 lb (95.3 kg)    01/02/17 214 lb 1.9 oz (97.1 kg)  10/03/16 215 lb 9.6 oz (97.8 kg)      Other studies Reviewed: Additional studies/ records that were reviewed today include: . Review of the above records demonstrates:    ASSESSMENT AND PLAN:  1.  Dynamic left ventricular outflow tract obstruction -   Still has significant symptoms and a 2/6 murmur. We will increase the metoprolol to 50 mg twice a day.  I'll see him back in the office in one month. I anticipate increasing his metoprolol up to 100 mg twice a day as tolerated at that visit.  Current medicines are reviewed at length with the patient today.  The patient does not have concerns regarding medicines.  The following changes have been made:  no change  Labs/ tests ordered today include:   No orders of the defined types were placed in this encounter.   Mertie Moores, MD  03/01/2017 4:50 PM    [facility] [address], [place], [place]  [address] Phone: [phone]; Fax: [phone]

## 2017-03-03 ENCOUNTER — Telehealth: Payer: Self-pay | Admitting: Cardiovascular Disease

## 2017-03-03 NOTE — Telephone Encounter (Signed)
Spoke with patient who states he took 3 BP and pulse readings at a local pharmacy.  He reports readings of: 117/76 mmHg, pulse 62 bpm; 107/69 mmHg, pulse 57 bpm;  and 124/72 mmHg, pulse 56. These were taken consecutively over a period of a few minutes. He states he gets dizzy sometimes when changing positions or walking from one location to another but he stops for a few seconds and it resolves. I advised him that it will take him several days to adjust to the increased dose of metoprolol. I advised him that if symptoms persist or worsen, that he can try taking the medication in 3 doses rather than 2 to see if taking a dose of metoprolol in the middle of the day helps. I advised him to hold a dose of the medication if the BP is < 100/50 mmHg or if the pulse is < 50 bpm. I advised him to back next week with questions or concerns. He verbalized understanding and agreement with plan and thanked me for the call.

## 2017-03-03 NOTE — Telephone Encounter (Signed)
New Message     Pt c/o medication issue:  1. Name of Medication:  metoprolol (LOPRESSOR) 50 MG tablet Take 1 tablet (50 mg total) by mouth 2 (two) times daily.     2. How are you currently taking this medication (dosage and times per day)?   3. Are you having a reaction (difficulty breathing--STAT)? Sob with exertion  4. What is your medication issue? Just started taking the increased dosage, he is getting lightheaded and sob with walking

## 2017-03-03 NOTE — Telephone Encounter (Signed)
Spoke with patient who states he has taken metoprolol 50 mg since Wednesday evening and is experiencing light-headedness, SOB, and fatigue. He states he is going soon to check his BP. I advised that I will call him back at 1 pm to see what readings he gets. He verbalized understanding and agreement and thanked me for the call.

## 2017-03-03 NOTE — Telephone Encounter (Signed)
I think he needs to give the higher dose of metoprolol a bit more time to get used to it. He may try spreading out the dose through the day but continue to try to take 100 mg through the day  He may hold a dose if his BP is low

## 2017-03-03 NOTE — Telephone Encounter (Signed)
Received call from patient-patient states since he saw Dr. Acie Fredrickson on 4/25 and his medication was increased he is having SOB with exertion, dizzy and lightheaded with changing position.   Unable to take Bp and HR at home, reports he took Wednesday PM and was 138/74.   Denies CP, syncope, blurred vision.     Per chart review:   1.  Dynamic left ventricular outflow tract obstruction -   Still has significant symptoms and a 2/6 murmur. We will increase the metoprolol to 50 mg twice a day.  I'll see him back in the office in one month. I anticipate increasing his metoprolol up to 100 mg twice a day as tolerated at that visit.   Patient reports he is going to "go across the road" to take BP and will report back.    Advised I would route to MD for recommendations.

## 2017-03-29 ENCOUNTER — Ambulatory Visit (INDEPENDENT_AMBULATORY_CARE_PROVIDER_SITE_OTHER): Payer: Medicare Other | Admitting: Physician Assistant

## 2017-03-29 ENCOUNTER — Encounter: Payer: Self-pay | Admitting: Physician Assistant

## 2017-03-29 VITALS — BP 118/60 | HR 63 | Ht 75.0 in | Wt 208.4 lb

## 2017-03-29 DIAGNOSIS — R0602 Shortness of breath: Secondary | ICD-10-CM | POA: Diagnosis not present

## 2017-03-29 DIAGNOSIS — I421 Obstructive hypertrophic cardiomyopathy: Secondary | ICD-10-CM

## 2017-03-29 DIAGNOSIS — Z79899 Other long term (current) drug therapy: Secondary | ICD-10-CM

## 2017-03-29 NOTE — Patient Instructions (Signed)
Medication Instructions:  None  Labwork: BMET, CBC, TSH today  Testing/Procedures: Your physician has requested that you have a cardiac MRI. Cardiac MRI uses a computer to create images of your heart as its beating, producing both still and moving pictures of your heart and major blood vessels. For further information please visit [email or web address] Please follow the instruction sheet given to you today for more information.   Follow-Up: Your physician recommends that you schedule a follow-up appointment in: 3 months with Dr. Acie Fredrickson.    Any Other Special Instructions Will Be Listed Below (If Applicable).     If you need a refill on your cardiac medications before your next appointment, please call your pharmacy.

## 2017-03-29 NOTE — Progress Notes (Signed)
Cardiology Office Note:    Date:  03/29/2017   ID:  ILAN KAHRS, DOB 1946-06-21, MRN [id number]  PCP:  Gaynelle Arabian, MD  Cardiologist:  Dr. Liam Rogers    Referring MD: Gaynelle Arabian, MD   Chief Complaint  Patient presents with  . HOCM    follow up    History of Present Illness:    MACRAE WIEGMAN is a 71 y.o. male with a hx of HOCM, bipolar disorder.  He started having issues with dyspnea on exertion 2 years ago.  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  He has a hx of mild LVOT gradient on echocardiogram.  CPX test in 6/17 suggested deconditioning as a cause of his dyspnea on exertion.  He mainly notes issues with shortness of breath after a period of inactivity.  He seems to be fine if he has been active for a while.  However, he still cannot run.  He had a cardiac MRI in 3/18 that suggested a more significant LVOT gradient.  However, he was off beta-blocker at the time.  Therefore, his beta-blocker was resumed and titrated further. He was last seen by Dr. Liam Rogers in 02/2017.    Mr. Kray returns for follow up.  He is here alone. He has had no improvement in his shortness of breath.  He denies chest pain, palpitations, orthopnea, PND or edema. He denies syncope. He does tell me that he was able to walk about 7 miles this past weekend without difficulty.  Prior CV studies:   The following studies were reviewed today:  Cardiac MRI 01/2017 Severe asymmetric septal hypertrophy, EF 74, SAM, moderate MR, turbulent flow suggestive of significant LVOT gradient, picture consistent with HOCM  Echo 04/2016 Mild concentric LVH, LVOT gradient 14, vigorous LVF, EF 65-70, mild MR  Cardiopulmonary stress test 04/2016 Normal functional capacity when compared to matched sedentary norms. Patient primarily limited by body habitus and deconditioning.  Myoview 03/2016 EF 58, normal perfusion, low risk  Echo 07/2011 EF 69.8, LVOT gradient 16, mild LVH,  trace MR, grade 1 diastolic dysfunction  Past Medical History:  Diagnosis Date  . Depression   . HOCM (hypertrophic obstructive cardiomyopathy) ([place]) 06/24/2016   cMRI 3/18: Severe asymm septal hypertrophy, EF 74, SAM, mod MR, turb flow sugg of significant LVOT gradient, picture c/w HOCM // Echo 6/17: Mild conc LVH, LVOT gradient 14, EF 65-70, mild MR // CP stress test 6/17: NL functional capacity compared to matched sedentary norms. Pt primarily limited by body habitus and deconditioning // Echo 9/12: EF 69.8, LVOT gradient 16   . Hyperlipidemia   . Sleep apnea   . Testicular cancer [facility])     Past Surgical History:  Procedure Laterality Date  . ORCHIECTOMY      Current Medications: Current Meds  Medication Sig  . ANDROGEL PUMP 20.25 MG/ACT (1.62%) GEL Apply 1 application topically daily. 1 APPLICATION UNDER EACH ARM DAILY  . buPROPion (WELLBUTRIN) 100 MG tablet Take 100 mg by mouth 2 (two) times daily.  Marland Kitchen lamoTRIgine (LAMICTAL) 200 MG tablet Take 200 mg by mouth daily.  . metoprolol (LOPRESSOR) 50 MG tablet Take 1 tablet (50 mg total) by mouth 2 (two) times daily.  . naproxen sodium (ANAPROX) 220 MG tablet Take 220 mg by mouth 2 (two) times daily with a meal.  . pravastatin (PRAVACHOL) 40 MG tablet Take 40 mg by mouth daily.  . QUEtiapine (SEROQUEL) 200 MG tablet Take 200 mg by mouth  at bedtime.  Marland Kitchen venlafaxine XR (EFFEXOR-XR) 150 MG 24 hr capsule Take 150 mg by mouth daily with breakfast.     Allergies:   Ambien [zolpidem tartrate] and Prozac [fluoxetine hcl]   Social History   Social History  . Marital status: Single    Spouse name: N/A  . Number of children: N/A  . Years of education: N/A   Social History Main Topics  . Smoking status: Never Smoker  . Smokeless tobacco: Never Used  . Alcohol use No  . Drug use: No  . Sexual activity: Not Asked   Other Topics Concern  . None   Social History Narrative  . None     Family Hx: The patient's family history includes  Cancer in his father; Depression in his mother; Thyroid disease in his mother.  ROS:   Please see the history of present illness.    ROS All other systems reviewed and are negative.   EKGs/Labs/Other Test Reviewed:    EKG:  EKG is not ordered today.  The ekg ordered today demonstrates n/a  Recent Labs: 01/16/2017: Creatinine, Ser 1.18   Recent Lipid Panel No results found for: CHOL, TRIG, HDL, CHOLHDL, VLDL, LDLCALC, LDLDIRECT   Physical Exam:    VS:  BP 118/60   Pulse 63   Ht 6\' 3"  (1.905 m)   Wt 208 lb 6.4 oz (94.5 kg)   BMI 26.05 kg/m     Wt Readings from Last 3 Encounters:  03/29/17 208 lb 6.4 oz (94.5 kg)  03/01/17 210 lb (95.3 kg)  01/02/17 214 lb 1.9 oz (97.1 kg)     Physical Exam  Constitutional: He is oriented to person, place, and time. He appears well-developed and well-nourished. No distress.  HENT:  Head: Normocephalic and atraumatic.  Eyes: No scleral icterus.  Neck: Normal range of motion. No JVD present.  Cardiovascular: Normal rate, regular rhythm, S1 normal and S2 normal.   Murmur heard.  Medium-pitched holosystolic murmur is present with a grade of 2/6  at the lower left sternal border Pulmonary/Chest: Effort normal and breath sounds normal. He has no wheezes. He has no rhonchi. He has no rales.  Abdominal: Soft. There is no tenderness.  Musculoskeletal: He exhibits no edema.  Neurological: He is alert and oriented to person, place, and time.  Skin: Skin is warm and dry.  Psychiatric: He has a normal mood and affect.    ASSESSMENT:    1. HOCM (hypertrophic obstructive cardiomyopathy) ([place])   2. SOB (shortness of breath)   3. Long-term use of high-risk medication    PLAN:    In order of problems listed above:  1. HOCM (hypertrophic obstructive cardiomyopathy) ([place]) -  He remains short of breath with certain activities.  He has not noted much difference with titration of the beta-blocker.  His HR will not tolerate further increase in his  before.  He initially was dizzy when the Metoprolol was increased to 50 mg bid.  However, he is now tolerating it well.  I called Dr. Liam Rogers and spoke with him by phone.  Mr. Tillman LVOT gradient was best measured on cardiac MRI.  We will repeat cMRI on max beta-blocker therapy.  -  Arrange MR Card Morphology Wo/W Cm   2. SOB (shortness of breath) - Suspect shortness of breath related to HOCM.  Will obtain cMRI as noted.  Also, no CBC or TSH on file in 3 years.  Will obtain CBC, TSH along with BMET (for cMRI) today.  Dispo:  Return in about 3 months (around 06/29/2017) for Routine Follow Up, w/ Dr. Acie Fredrickson.   Medication Adjustments/Labs and Tests Ordered: Current medicines are reviewed at length with the patient today.  Concerns regarding medicines are outlined above.  Orders/Tests:  Orders Placed This Encounter  Procedures  . MR Card Morphology Wo/W Cm  . CBC  . TSH  . Basic metabolic panel  . EKG 12-Lead   Medication changes: No orders of the defined types were placed in this encounter.  Signed, Richardson Dopp, PA-C  03/29/2017 4:41 PM    [facility] [place], [place], [place]  [address] Phone: [phone]; Fax: [phone]

## 2017-03-30 ENCOUNTER — Telehealth: Payer: Self-pay | Admitting: Physician Assistant

## 2017-03-30 ENCOUNTER — Other Ambulatory Visit: Payer: Self-pay | Admitting: *Deleted

## 2017-03-30 LAB — CBC
HEMATOCRIT: 42.3 % (ref 37.5–51.0)
HEMOGLOBIN: 14.3 g/dL (ref 13.0–17.7)
MCH: 29.9 pg (ref 26.6–33.0)
MCHC: 33.8 g/dL (ref 31.5–35.7)
MCV: 88 fL (ref 79–97)
Platelets: 270 10*3/uL (ref 150–379)
RBC: 4.79 x10E6/uL (ref 4.14–5.80)
RDW: 13.8 % (ref 12.3–15.4)
WBC: 5.3 10*3/uL (ref 3.4–10.8)

## 2017-03-30 LAB — BASIC METABOLIC PANEL
BUN/Creatinine Ratio: 14 (ref 10–24)
BUN: 22 mg/dL (ref 8–27)
CO2: 28 mmol/L (ref 18–29)
CREATININE: 1.54 mg/dL — AB (ref 0.76–1.27)
Calcium: 9.6 mg/dL (ref 8.6–10.2)
Chloride: 98 mmol/L (ref 96–106)
GFR calc Af Amer: 52 mL/min/{1.73_m2} — ABNORMAL LOW (ref >59)
GFR, EST NON AFRICAN AMERICAN: 45 mL/min/{1.73_m2} — AB (ref >59)
Glucose: 95 mg/dL (ref 65–99)
Potassium: 4.8 mmol/L (ref 3.5–5.2)
Sodium: 142 mmol/L (ref 134–144)

## 2017-03-30 LAB — TSH: TSH: 2 u[IU]/mL (ref 0.450–4.500)

## 2017-03-30 NOTE — Telephone Encounter (Signed)
Called the patient and left a VM to call back to let me know when he wants to have his MRI.

## 2017-03-31 ENCOUNTER — Telehealth: Payer: Self-pay | Admitting: *Deleted

## 2017-03-31 MED ORDER — METOPROLOL TARTRATE 25 MG PO TABS: 37.5000 mg | ORAL_TABLET | Freq: Two times a day (BID) | ORAL | 3 refills | Status: DC

## 2017-03-31 NOTE — Telephone Encounter (Signed)
-----   Message from Rodman Key, RN sent at 03/30/2017  5:23 PM EDT ----- Left message for patient to call back.

## 2017-03-31 NOTE — Telephone Encounter (Signed)
Pt s/w Brooke Dare. RN yesterday 5/24 with lab results taken to the DOD. I called pt today to go over new recommendations per Brynda Rim. PA in addition to stop Naprox and to increase fluids as directed by DOD Dr. Saunders Revel to now also decrease Metoprolol tartrate to 37.5 mg BID due to creatinine is increased. Advised pt per PA who feels possible increase in the Metoprolol may be what has caused the kidney function to increase, in turn to decrease Metoprolol dose. Pt advised as well as per PA need to have EKG, BMET in 1 week. Pt agreeable and has been scheduled for 04/06/17 11 EKG, BMET 11:30 same day. Pt states he is concerned if Dr. Acie Fredrickson has been kept in the loop about things. I assured the pt yes, Dr. Acie Fredrickson is aware of lab results and recommendations. I believe lab results have been sent to PCP though I will send results again to PCP since there has been a change in the recommendations. Pt thanked me for my time and my call today.

## 2017-04-06 ENCOUNTER — Ambulatory Visit (INDEPENDENT_AMBULATORY_CARE_PROVIDER_SITE_OTHER): Payer: Medicare Other | Admitting: Nurse Practitioner

## 2017-04-06 ENCOUNTER — Other Ambulatory Visit: Payer: Medicare Other | Admitting: *Deleted

## 2017-04-06 VITALS — BP 90/64 | HR 66 | Ht 75.0 in | Wt 211.8 lb

## 2017-04-06 DIAGNOSIS — I421 Obstructive hypertrophic cardiomyopathy: Secondary | ICD-10-CM

## 2017-04-06 MED ORDER — ASPIRIN EC 81 MG PO TBEC: 81.0000 mg | DELAYED_RELEASE_TABLET | Freq: Every day | ORAL | Status: DC

## 2017-04-06 MED ORDER — METOPROLOL TARTRATE 25 MG PO TABS: 25.0000 mg | ORAL_TABLET | Freq: Two times a day (BID) | ORAL | 3 refills | Status: DC

## 2017-04-06 NOTE — Progress Notes (Signed)
1.) Reason for visit: EKG, BP and BMET for recent change in metoprolol therapy  2.) Name of MD requesting visit: Richardson Dopp, PA/Dr. Nahser  3.) H&P: hx HOCM, recently seen in follow-up for medication therapy.    4.) ROS related to problem: Patient was advised on 5/25 to reduce metoprolol to 37.5 mg twice daily per Richardson Dopp, PA due to elevated creatinine  5.) Assessment and plan per MD: patient complains of dizziness and SOB when he goes from sitting to walking, especially if he has been sitting for an extended amount of time. He stumbled as we entered the patient room but was able to get safely onto the exam table without injury. EKG was performed and reviewed by Richardson Dopp, PA. Orthostatic vital signs were done with the assistance of another nurse; patient tolerated well. Richardson Dopp asked for Dr. Elmarie Shiley input on plan for patient. Per Dr. Acie Fredrickson, we will decrease metoprolol to 25 mg BID and schedule patient for right and left heart cath. Cath is scheduled for Monday 6/4 with Dr. Saunders Revel.

## 2017-04-06 NOTE — Patient Instructions (Signed)
Medication Instructions:  DECREASE Metoprolol to 25 mg (1 tablet) twice daily TAKE Aspirin 81 mg starting today through Monday  Labwork: TODAY - Pre-cath labs (Pt/INR, BMET, CBC)   Testing/Procedures:   [place] OFFICE [address], [place] [address] Dept: [phone] Loc: [phone]  Michael Barton  04/06/2017  You are scheduled for a Cardiac Catheterization on Monday, June 4 with Dr. Harrell Gave End.  1. Please arrive at the [facility] (Main Entrance A) at [address] [facility]: [place], [place] [address] at 10:00 AM (two hours before your procedure to ensure your preparation). Free valet parking service is available.   Special note: Every effort is made to have your procedure done on time. Please understand that emergencies sometimes delay scheduled procedures.  2. Diet: Do not eat or drink anything after midnight prior to your procedure except sips of water to take medications.  3. Labs: You will need to have blood drawn on Thursday, May 31 at [facility] at [facility]. [address]  Open: 7:30am - 5pm    Phone: [phone]. You do not need to be fasting.  4. Medication instructions in preparation for your procedure:  On the morning of your procedure, take your Aspirin and any morning medicines NOT listed above.  You may use sips of water.  5. Plan for one night stay--bring personal belongings. 6. Bring a current list of your medications and current insurance cards. 7. You MUST have a responsible person to drive you home. 8. Someone MUST be with you the first 24 hours after you arrive home or your discharge will be delayed. 9. Please wear clothes that are easy to get on and off and wear slip-on shoes.  Thank you for allowing [place] to care for you!   -- [facility]    Follow-Up: Your physician  recommends that you schedule a follow-up appointment in: will follow-up based on cath results   If you need a refill on your cardiac medications before your next appointment, please call your pharmacy.   Thank you for choosing [facility]! Christen Bame, RN [phone]

## 2017-04-07 ENCOUNTER — Telehealth: Payer: Self-pay

## 2017-04-07 LAB — BASIC METABOLIC PANEL
BUN/Creatinine Ratio: 8 — ABNORMAL LOW (ref 10–24)
BUN: 11 mg/dL (ref 8–27)
CALCIUM: 9 mg/dL (ref 8.6–10.2)
CHLORIDE: 96 mmol/L (ref 96–106)
CO2: 25 mmol/L (ref 18–29)
Creatinine, Ser: 1.38 mg/dL — ABNORMAL HIGH (ref 0.76–1.27)
GFR calc Af Amer: 59 mL/min/{1.73_m2} — ABNORMAL LOW (ref >59)
GFR calc non Af Amer: 51 mL/min/{1.73_m2} — ABNORMAL LOW (ref >59)
Glucose: 98 mg/dL (ref 65–99)
POTASSIUM: 4.9 mmol/L (ref 3.5–5.2)
SODIUM: 136 mmol/L (ref 134–144)

## 2017-04-07 LAB — CBC
HEMOGLOBIN: 13.4 g/dL (ref 13.0–17.7)
Hematocrit: 38.7 % (ref 37.5–51.0)
MCH: 30 pg (ref 26.6–33.0)
MCHC: 34.6 g/dL (ref 31.5–35.7)
MCV: 87 fL (ref 79–97)
Platelets: 231 10*3/uL (ref 150–379)
RBC: 4.46 x10E6/uL (ref 4.14–5.80)
RDW: 13.2 % (ref 12.3–15.4)
WBC: 4.7 10*3/uL (ref 3.4–10.8)

## 2017-04-07 LAB — PROTIME-INR
INR: 1 (ref 0.8–1.2)
PROTHROMBIN TIME: 11 s (ref 9.1–12.0)

## 2017-04-07 NOTE — Addendum Note (Signed)
Addended by: Emmaline Life on: 04/07/2017 12:40 PM   Modules accepted: Orders

## 2017-04-07 NOTE — Telephone Encounter (Signed)
Patient contacted pre-catheterization at [facility] scheduled for: 04/10/2017 @ 1200  Verified arrival time and place: yes-per review of patient labs, patient creatinine 1.38.  Discussed with Richardson Dopp.  Pt to arrive @ 900 to receive extra precath IV fluids.  Patient provided instruction to arrive at admitting @ 900 rather than 1000.  Pt indicates understanding.  Cath lab alerted to need for addl IV fluids. Confirmed AM meds to be taken pre-cath with sip of water: yes Confirmed patient has responsible person to drive home post procedure and observe patient for 24 hours: yes Pt indicates understanding of precath instruction.  Requested Pt call office if any further questions.

## 2017-04-10 ENCOUNTER — Ambulatory Visit (HOSPITAL_COMMUNITY)
Admission: RE | Admit: 2017-04-10 | Discharge: 2017-04-10 | Disposition: A | Discharge status: Home / Self Care | Payer: Medicare Other | Source: Ambulatory Visit | Attending: Internal Medicine | Admitting: Internal Medicine

## 2017-04-10 ENCOUNTER — Encounter (HOSPITAL_COMMUNITY)
Admission: RE | Disposition: A | Discharge status: Home / Self Care | Payer: Self-pay | Source: Ambulatory Visit | Attending: Internal Medicine

## 2017-04-10 DIAGNOSIS — G4730 Sleep apnea, unspecified: Secondary | ICD-10-CM | POA: Insufficient documentation

## 2017-04-10 DIAGNOSIS — F319 Bipolar disorder, unspecified: Secondary | ICD-10-CM | POA: Diagnosis not present

## 2017-04-10 DIAGNOSIS — I2510 Atherosclerotic heart disease of native coronary artery without angina pectoris: Secondary | ICD-10-CM | POA: Diagnosis not present

## 2017-04-10 DIAGNOSIS — Z79899 Other long term (current) drug therapy: Secondary | ICD-10-CM | POA: Diagnosis not present

## 2017-04-10 DIAGNOSIS — R0602 Shortness of breath: Secondary | ICD-10-CM

## 2017-04-10 DIAGNOSIS — I421 Obstructive hypertrophic cardiomyopathy: Secondary | ICD-10-CM | POA: Diagnosis not present

## 2017-04-10 DIAGNOSIS — E785 Hyperlipidemia, unspecified: Secondary | ICD-10-CM | POA: Diagnosis not present

## 2017-04-10 HISTORY — PX: RIGHT/LEFT HEART CATH AND CORONARY ANGIOGRAPHY: CATH118266

## 2017-04-10 LAB — POCT I-STAT 3, ART BLOOD GAS (G3+)
Acid-Base Excess: 1 mmol/L (ref 0.0–2.0)
Bicarbonate: 26.2 mmol/L (ref 20.0–28.0)
O2 SAT: 95 %
PH ART: 7.379 (ref 7.350–7.450)
TCO2: 28 mmol/L (ref 0–100)
pCO2 arterial: 44.4 mmHg (ref 32.0–48.0)
pO2, Arterial: 77 mmHg — ABNORMAL LOW (ref 83.0–108.0)

## 2017-04-10 LAB — POCT I-STAT 3, VENOUS BLOOD GAS (G3P V)
BICARBONATE: 26.5 mmol/L (ref 20.0–28.0)
O2 Saturation: 66 %
PCO2 VEN: 47.6 mmHg (ref 44.0–60.0)
TCO2: 28 mmol/L (ref 0–100)
pH, Ven: 7.353 (ref 7.250–7.430)
pO2, Ven: 36 mmHg (ref 32.0–45.0)

## 2017-04-10 LAB — POCT ACTIVATED CLOTTING TIME: ACTIVATED CLOTTING TIME: 175 s

## 2017-04-10 SURGERY — RIGHT/LEFT HEART CATH AND CORONARY ANGIOGRAPHY
Anesthesia: LOCAL

## 2017-04-10 MED ORDER — SODIUM CHLORIDE 0.9 % IV SOLN: INTRAVENOUS | Status: AC

## 2017-04-10 MED ORDER — LIDOCAINE HCL (PF) 1 % IJ SOLN
INTRAMUSCULAR | Status: DC | PRN
Start: 2017-04-10 — End: 2017-04-10
  Administered 2017-04-10 (×2): 2 mL

## 2017-04-10 MED ORDER — IOPAMIDOL (ISOVUE-370) INJECTION 76%
INTRAVENOUS | Status: AC
Start: 2017-04-10 — End: 2017-04-10
  Filled 2017-04-10: qty 100

## 2017-04-10 MED ORDER — VERAPAMIL HCL 2.5 MG/ML IV SOLN
INTRAVENOUS | Status: AC
  Filled 2017-04-10: qty 2

## 2017-04-10 MED ORDER — HEPARIN SODIUM (PORCINE) 1000 UNIT/ML IJ SOLN
INTRAMUSCULAR | Status: DC | PRN
  Administered 2017-04-10: 5000 [IU] via INTRAVENOUS

## 2017-04-10 MED ORDER — SODIUM CHLORIDE 0.9% FLUSH: 3.0000 mL | INTRAVENOUS | Status: DC | PRN

## 2017-04-10 MED ORDER — MIDAZOLAM HCL 2 MG/2ML IJ SOLN
INTRAMUSCULAR | Status: AC
  Filled 2017-04-10: qty 2

## 2017-04-10 MED ORDER — FENTANYL CITRATE (PF) 100 MCG/2ML IJ SOLN
INTRAMUSCULAR | Status: DC | PRN
Start: 2017-04-10 — End: 2017-04-10
  Administered 2017-04-10: 25 ug via INTRAVENOUS

## 2017-04-10 MED ORDER — HEPARIN (PORCINE) IN NACL 2-0.9 UNIT/ML-% IJ SOLN
INTRAMUSCULAR | Status: AC
  Filled 2017-04-10: qty 1000

## 2017-04-10 MED ORDER — HEPARIN (PORCINE) IN NACL 2-0.9 UNIT/ML-% IJ SOLN
INTRAMUSCULAR | Status: DC | PRN
  Administered 2017-04-10: 11:00:00

## 2017-04-10 MED ORDER — SODIUM CHLORIDE 0.9% FLUSH: 3.0000 mL | Freq: Two times a day (BID) | INTRAVENOUS | Status: DC

## 2017-04-10 MED ORDER — ASPIRIN 81 MG PO CHEW
81.0000 mg | CHEWABLE_TABLET | ORAL | Status: AC
  Administered 2017-04-10: 81 mg via ORAL

## 2017-04-10 MED ORDER — ASPIRIN 81 MG PO CHEW
CHEWABLE_TABLET | ORAL | Status: AC
  Administered 2017-04-10: 81 mg via ORAL
  Filled 2017-04-10: qty 1

## 2017-04-10 MED ORDER — SODIUM CHLORIDE 0.9 % WEIGHT BASED INFUSION: 1.0000 mL/kg/h | INTRAVENOUS | Status: DC

## 2017-04-10 MED ORDER — NITROGLYCERIN 1 MG/10 ML FOR IR/CATH LAB
INTRA_ARTERIAL | Status: AC
  Filled 2017-04-10: qty 10

## 2017-04-10 MED ORDER — SODIUM CHLORIDE 0.9 % IV SOLN: 250.0000 mL | INTRAVENOUS | Status: DC | PRN

## 2017-04-10 MED ORDER — VERAPAMIL HCL 2.5 MG/ML IV SOLN
INTRAVENOUS | Status: DC | PRN
  Administered 2017-04-10: 10 mL via INTRA_ARTERIAL

## 2017-04-10 MED ORDER — SODIUM CHLORIDE 0.9 % WEIGHT BASED INFUSION
3.0000 mL/kg/h | INTRAVENOUS | Status: AC
  Administered 2017-04-10: 3 mL/kg/h via INTRAVENOUS

## 2017-04-10 MED ORDER — IOPAMIDOL (ISOVUE-370) INJECTION 76%
INTRAVENOUS | Status: DC | PRN
  Administered 2017-04-10: 40 mL via INTRA_ARTERIAL

## 2017-04-10 MED ORDER — NITROGLYCERIN 1 MG/10 ML FOR IR/CATH LAB
INTRA_ARTERIAL | Status: DC | PRN
  Administered 2017-04-10: 200 ug via INTRACORONARY

## 2017-04-10 MED ORDER — LIDOCAINE HCL 1 % IJ SOLN
INTRAMUSCULAR | Status: AC
  Filled 2017-04-10: qty 20

## 2017-04-10 MED ORDER — HEPARIN SODIUM (PORCINE) 1000 UNIT/ML IJ SOLN
INTRAMUSCULAR | Status: AC
  Filled 2017-04-10: qty 1

## 2017-04-10 MED ORDER — FENTANYL CITRATE (PF) 100 MCG/2ML IJ SOLN
INTRAMUSCULAR | Status: AC
  Filled 2017-04-10: qty 2

## 2017-04-10 MED ORDER — MIDAZOLAM HCL 2 MG/2ML IJ SOLN
INTRAMUSCULAR | Status: DC | PRN
  Administered 2017-04-10: 1 mg via INTRAVENOUS

## 2017-04-10 SURGICAL SUPPLY — 18 items
CATH BALLN WEDGE 5F 110CM (CATHETERS) ×2 IMPLANT
CATH EXPO 5F MPA-1 (CATHETERS) ×2 IMPLANT
CATH IMPULSE 5F ANG/FL3.5 (CATHETERS) ×2 IMPLANT
COVER PRB 48X5XTLSCP FOLD TPE (BAG) ×1 IMPLANT
COVER PROBE 5X48 (BAG) ×1
DEVICE RAD COMP TR BAND LRG (VASCULAR PRODUCTS) ×2 IMPLANT
GLIDESHEATH SLEND SS 6F .021 (SHEATH) ×2 IMPLANT
GUIDEWIRE INQWIRE 1.5J.035X260 (WIRE) ×1 IMPLANT
INQWIRE 1.5J .035X260CM (WIRE) ×2
KIT HEART LEFT (KITS) ×2 IMPLANT
KIT MICROINTRODUCER STIFF 5F (SHEATH) ×2 IMPLANT
PACK CARDIAC CATHETERIZATION (CUSTOM PROCEDURE TRAY) ×2 IMPLANT
SHEATH GLIDE SLENDER 4/5FR (SHEATH) ×2 IMPLANT
SHEATH PINNACLE 5F 10CM (SHEATH) ×2 IMPLANT
TRANSDUCER W/STOPCOCK (MISCELLANEOUS) ×4 IMPLANT
TUBING ART PRESS 72  MALE/FEM (TUBING) ×1
TUBING ART PRESS 72 MALE/FEM (TUBING) ×1 IMPLANT
TUBING CIL FLEX 10 FLL-RA (TUBING) ×2 IMPLANT

## 2017-04-10 NOTE — Interval H&P Note (Signed)
History and Physical Interval Note:  04/10/2017 11:13 AM  Michael Barton  has presented today for cardiac catheterization, with the diagnosis of hypertrophic cardiomyopathy and shortness of breath. The various methods of treatment have been discussed with the patient and family. After consideration of risks, benefits and other options for treatment, the patient has consented to  Procedure(s): Right/Left Heart Cath and Coronary Angiography (N/A) as a surgical intervention .  The patient's history has been reviewed, patient examined, no change in status, stable for surgery.  I have reviewed the patient's chart and labs.  Questions were answered to the patient's satisfaction.    Cath Lab Visit (complete for each Cath Lab visit)  Clinical Evaluation Leading to the Procedure:   ACS: No.  Non-ACS:    Anginal Classification: CCS III  Anti-ischemic medical therapy: Minimal Therapy (1 class of medications)  Non-Invasive Test Results: No non-invasive testing performed  Prior CABG: No previous CABG  Karry Barrilleaux

## 2017-04-10 NOTE — Progress Notes (Signed)
Unable to locate medial AC veins on right or left arm. Anderson Malta, RN in to see pt and aware, okay to insert 2nd IV anywhere except right wrist area per her.

## 2017-04-10 NOTE — H&P (View-Only) (Signed)
Cardiology Office Note:    Date:  03/29/2017   ID:  SAIR FAULCON, DOB February 25, 1946, MRN [id number]  PCP:  Gaynelle Arabian, MD  Cardiologist:  Dr. Liam Rogers    Referring MD: Gaynelle Arabian, MD   Chief Complaint  Patient presents with  . HOCM    follow up    History of Present Illness:    Michael Barton is a 71 y.o. male with a hx of HOCM, bipolar disorder.  He started having issues with dyspnea on exertion 2 years ago.  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  He has a hx of mild LVOT Barton on echocardiogram.  CPX test in 6/17 suggested deconditioning as a cause of his dyspnea on exertion.  He mainly notes issues with shortness of breath after a period of inactivity.  He seems to be fine if he has been active for a while.  However, he still cannot run.  He had a cardiac MRI in 3/18 that suggested a more significant LVOT Barton.  However, he was off beta-blocker at the time.  Therefore, his beta-blocker was resumed and titrated further. He was last seen by Dr. Liam Rogers in 02/2017.    Michael Barton returns for follow up.  He is here alone. He has had no improvement in his shortness of breath.  He denies chest pain, palpitations, orthopnea, PND or edema. He denies syncope. He does tell me that he was able to walk about 7 miles this past weekend without difficulty.  Prior CV studies:   The following studies were reviewed today:  Cardiac MRI 01/2017 Severe asymmetric septal hypertrophy, EF 74, SAM, moderate MR, turbulent flow suggestive of significant LVOT Barton, picture consistent with HOCM  Echo 04/2016 Mild concentric LVH, LVOT Barton 14, vigorous LVF, EF 65-70, mild MR  Cardiopulmonary stress test 04/2016 Normal functional capacity when compared to matched sedentary norms. Patient primarily limited by body habitus and deconditioning.  Myoview 03/2016 EF 58, normal perfusion, low risk  Echo 07/2011 EF 69.8, LVOT Barton 16, mild LVH,  trace MR, grade 1 diastolic dysfunction  Past Medical History:  Diagnosis Date  . Depression   . HOCM (hypertrophic obstructive cardiomyopathy) ([place]) 06/24/2016   cMRI 3/18: Severe asymm septal hypertrophy, EF 74, SAM, mod MR, turb flow sugg of significant LVOT Barton, picture c/w HOCM // Echo 6/17: Mild conc LVH, LVOT Barton 14, EF 65-70, mild MR // CP stress test 6/17: NL functional capacity compared to matched sedentary norms. Pt primarily limited by body habitus and deconditioning // Echo 9/12: EF 69.8, LVOT Barton 16   . Hyperlipidemia   . Sleep apnea   . Testicular cancer [facility])     Past Surgical History:  Procedure Laterality Date  . ORCHIECTOMY      Current Medications: Current Meds  Medication Sig  . ANDROGEL PUMP 20.25 MG/ACT (1.62%) GEL Apply 1 application topically daily. 1 APPLICATION UNDER EACH ARM DAILY  . buPROPion (WELLBUTRIN) 100 MG tablet Take 100 mg by mouth 2 (two) times daily.  Marland Kitchen lamoTRIgine (LAMICTAL) 200 MG tablet Take 200 mg by mouth daily.  . metoprolol (LOPRESSOR) 50 MG tablet Take 1 tablet (50 mg total) by mouth 2 (two) times daily.  . naproxen sodium (ANAPROX) 220 MG tablet Take 220 mg by mouth 2 (two) times daily with a meal.  . pravastatin (PRAVACHOL) 40 MG tablet Take 40 mg by mouth daily.  . QUEtiapine (SEROQUEL) 200 MG tablet Take 200 mg by mouth  at bedtime.  Marland Kitchen venlafaxine XR (EFFEXOR-XR) 150 MG 24 hr capsule Take 150 mg by mouth daily with breakfast.     Allergies:   Ambien [zolpidem tartrate] and Prozac [fluoxetine hcl]   Social History   Social History  . Marital status: Single    Spouse name: N/A  . Number of children: N/A  . Years of education: N/A   Social History Main Topics  . Smoking status: Never Smoker  . Smokeless tobacco: Never Used  . Alcohol use No  . Drug use: No  . Sexual activity: Not Asked   Other Topics Concern  . None   Social History Narrative  . None     Family Hx: The patient's family history includes  Cancer in his father; Depression in his mother; Thyroid disease in his mother.  ROS:   Please see the history of present illness.    ROS All other systems reviewed and are negative.   EKGs/Labs/Other Test Reviewed:    EKG:  EKG is not ordered today.  The ekg ordered today demonstrates n/a  Recent Labs: 01/16/2017: Creatinine, Ser 1.18   Recent Lipid Panel No results found for: CHOL, TRIG, HDL, CHOLHDL, VLDL, LDLCALC, LDLDIRECT   Physical Exam:    VS:  BP 118/60   Pulse 63   Ht 6\' 3"  (1.905 m)   Wt 208 lb 6.4 oz (94.5 kg)   BMI 26.05 kg/m     Wt Readings from Last 3 Encounters:  03/29/17 208 lb 6.4 oz (94.5 kg)  03/01/17 210 lb (95.3 kg)  01/02/17 214 lb 1.9 oz (97.1 kg)     Physical Exam  Constitutional: He is oriented to person, place, and time. He appears well-developed and well-nourished. No distress.  HENT:  Head: Normocephalic and atraumatic.  Eyes: No scleral icterus.  Neck: Normal range of motion. No JVD present.  Cardiovascular: Normal rate, regular rhythm, S1 normal and S2 normal.   Murmur heard.  Medium-pitched holosystolic murmur is present with a grade of 2/6  at the lower left sternal border Pulmonary/Chest: Effort normal and breath sounds normal. He has no wheezes. He has no rhonchi. He has no rales.  Abdominal: Soft. There is no tenderness.  Musculoskeletal: He exhibits no edema.  Neurological: He is alert and oriented to person, place, and time.  Skin: Skin is warm and dry.  Psychiatric: He has a normal mood and affect.    ASSESSMENT:    1. HOCM (hypertrophic obstructive cardiomyopathy) ([place])   2. SOB (shortness of breath)   3. Long-term use of high-risk medication    PLAN:    In order of problems listed above:  1. HOCM (hypertrophic obstructive cardiomyopathy) ([place]) -  He remains short of breath with certain activities.  He has not noted much difference with titration of the beta-blocker.  His HR will not tolerate further increase in his  before.  He initially was dizzy when the Metoprolol was increased to 50 mg bid.  However, he is now tolerating it well.  I called Dr. Liam Rogers and spoke with him by phone.  Michael Barton was best measured on cardiac MRI.  We will repeat cMRI on max beta-blocker therapy.  -  Arrange MR Card Morphology Wo/W Cm   2. SOB (shortness of breath) - Suspect shortness of breath related to HOCM.  Will obtain cMRI as noted.  Also, no CBC or TSH on file in 3 years.  Will obtain CBC, TSH along with BMET (for cMRI) today.  Dispo:  Return in about 3 months (around 06/29/2017) for Routine Follow Up, w/ Dr. Acie Fredrickson.   Medication Adjustments/Labs and Tests Ordered: Current medicines are reviewed at length with the patient today.  Concerns regarding medicines are outlined above.  Orders/Tests:  Orders Placed This Encounter  Procedures  . MR Card Morphology Wo/W Cm  . CBC  . TSH  . Basic metabolic panel  . EKG 12-Lead   Medication changes: No orders of the defined types were placed in this encounter.  Signed, Richardson Dopp, PA-C  03/29/2017 4:41 PM    [facility] [place], [place], [place]  [address] Phone: [phone]; Fax: [phone]

## 2017-04-10 NOTE — Discharge Instructions (Signed)
Radial Site Care °Refer to this sheet in the next few weeks. These instructions provide you with information about caring for yourself after your procedure. Your health care provider may also give you more specific instructions. Your treatment has been planned according to current medical practices, but problems sometimes occur. Call your health care provider if you have any problems or questions after your procedure. °What can I expect after the procedure? °After your procedure, it is typical to have the following: °· Bruising at the radial site that usually fades within 1-2 weeks. °· Blood collecting in the tissue (hematoma) that may be painful to the touch. It should usually decrease in size and tenderness within 1-2 weeks. ° °Follow these instructions at home: °· Take medicines only as directed by your health care provider. °· You may shower 24-48 hours after the procedure or as directed by your health care provider. Remove the bandage (dressing) and gently wash the site with plain soap and water. Pat the area dry with a clean towel. Do not rub the site, because this may cause bleeding. °· Do not take baths, swim, or use a hot tub until your health care provider approves. °· Check your insertion site every day for redness, swelling, or drainage. °· Do not apply powder or lotion to the site. °· Do not flex or bend the affected arm for 24 hours or as directed by your health care provider. °· Do not push or pull heavy objects with the affected arm for 24 hours or as directed by your health care provider. °· Do not lift over 10 lb (4.5 kg) for 5 days after your procedure or as directed by your health care provider. °· Ask your health care provider when it is okay to: °? Return to work or school. °? Resume usual physical activities or sports. °? Resume sexual activity. °· Do not drive home if you are discharged the same day as the procedure. Have someone else drive you. °· You may drive 24 hours after the procedure  unless otherwise instructed by your health care provider. °· Do not operate machinery or power tools for 24 hours after the procedure. °· If your procedure was done as an outpatient procedure, which means that you went home the same day as your procedure, a responsible adult should be with you for the first 24 hours after you arrive home. °· Keep all follow-up visits as directed by your health care provider. This is important. °Contact a health care provider if: °· You have a fever. °· You have chills. °· You have increased bleeding from the radial site. Hold pressure on the site. °Get help right away if: °· You have unusual pain at the radial site. °· You have redness, warmth, or swelling at the radial site. °· You have drainage (other than a small amount of blood on the dressing) from the radial site. °· The radial site is bleeding, and the bleeding does not stop after 30 minutes of holding steady pressure on the site. °· Your arm or hand becomes pale, cool, tingly, or numb. °This information is not intended to replace advice given to you by your health care provider. Make sure you discuss any questions you have with your health care provider. °Document Released: 11/26/2010 Document Revised: 03/31/2016 Document Reviewed: 05/12/2014 °Elsevier Interactive Patient Education © 2018 [employer] Inc[employer] °Femoral Site Care °Refer to this sheet in the next few weeks. These instructions provide you with information about caring for yourself after your procedure. Your   health care provider may also give you more specific instructions. Your treatment has been planned according to current medical practices, but problems sometimes occur. Call your health care provider if you have any problems or questions after your procedure. °What can I expect after the procedure? °After your procedure, it is typical to have the following: °· Bruising at the site that usually fades within 1-2 weeks. °· Blood collecting in the tissue (hematoma) that  may be painful to the touch. It should usually decrease in size and tenderness within 1-2 weeks. ° °Follow these instructions at home: °· Take medicines only as directed by your health care provider. °· You may shower 24-48 hours after the procedure or as directed by your health care provider. Remove the bandage (dressing) and gently wash the site with plain soap and water. Pat the area dry with a clean towel. Do not rub the site, because this may cause bleeding. °· Do not take baths, swim, or use a hot tub until your health care provider approves. °· Check your insertion site every day for redness, swelling, or drainage. °· Do not apply powder or lotion to the site. °· Limit use of stairs to twice a day for the first 2-3 days or as directed by your health care provider. °· Do not squat for the first 2-3 days or as directed by your health care provider. °· Do not lift over 10 lb (4.5 kg) for 5 days after your procedure or as directed by your health care provider. °· Ask your health care provider when it is okay to: °? Return to work or school. °? Resume usual physical activities or sports. °? Resume sexual activity. °· Do not drive home if you are discharged the same day as the procedure. Have someone else drive you. °· You may drive 24 hours after the procedure unless otherwise instructed by your health care provider. °· Do not operate machinery or power tools for 24 hours after the procedure or as directed by your health care provider. °· If your procedure was done as an outpatient procedure, which means that you went home the same day as your procedure, a responsible adult should be with you for the first 24 hours after you arrive home. °· Keep all follow-up visits as directed by your health care provider. This is important. °Contact a health care provider if: °· You have a fever. °· You have chills. °· You have increased bleeding from the site. Hold pressure on the site. °Get help right away if: °· You have  unusual pain at the site. °· You have redness, warmth, or swelling at the site. °· You have drainage (other than a small amount of blood on the dressing) from the site. °· The site is bleeding, and the bleeding does not stop after 30 minutes of holding steady pressure on the site. °· Your leg or foot becomes pale, cool, tingly, or numb. °This information is not intended to replace advice given to you by your health care provider. Make sure you discuss any questions you have with your health care provider. °Document Released: 06/27/2014 Document Revised: 03/31/2016 Document Reviewed: 05/13/2014 °Elsevier Interactive Patient Education © 2018 Elsevier Inc. ° °

## 2017-04-10 NOTE — Brief Op Note (Signed)
Brief Cardiac Catheterization Note  Date: 04/10/2017 Time:12:26 PM  PATIENT:  Michael Barton  71 y.o. male  PRE-OPERATIVE DIAGNOSIS:  HOCM  POST-OPERATIVE DIAGNOSIS:  Same  PROCEDURE:  Procedure(s): Right/Left Heart Cath and Coronary Angiography (N/A)  SURGEON:  Surgeon(s) and Role:    * Andras Grunewald, MD - Primary  FINDINGS: 1. Mild to moderate ostial RCA stenosis. Otherwise, no significant CAD. 2. Mild left ventricular intracavitary gradient, which becomes severe with provocation (see full report for details). 3. Mildly elevated left ventricular filling pressure.  RECOMMENDATIONS: 1. Optimize medical therapy, including aggressive HR control. 2. If symptoms persists, could consider septal reduction procedure.  Nelva Bush, MD [facility] Pager: [phone]

## 2017-04-10 NOTE — Progress Notes (Signed)
Site area: Right groin a 5 french venous sheath was removed  Site Prior to Removal:  Level 0  Pressure Applied For 15 MINUTES    Bedrest Beginning at 1300p  Manual:   Yes.    Patient Status During Pull:  stable  Post Pull Groin Site:  Level 0  Post Pull Instructions Given:  Yes.    Post Pull Pulses Present:  Yes.    Dressing Applied:  Yes.    Comments:  VS remain stable during sheath pull.

## 2017-04-11 ENCOUNTER — Telehealth: Payer: Self-pay | Admitting: Nurse Practitioner

## 2017-04-11 ENCOUNTER — Encounter (HOSPITAL_COMMUNITY): Payer: Self-pay | Admitting: Internal Medicine

## 2017-04-11 MED ORDER — VERAPAMIL HCL ER 180 MG PO TBCR: 180.0000 mg | EXTENDED_RELEASE_TABLET | Freq: Every day | ORAL | 3 refills | Status: DC

## 2017-04-11 NOTE — Telephone Encounter (Signed)
-----   Message from Thayer Headings, MD sent at 04/11/2017 12:35 PM EDT ----- Sharyn Lull, Please start Verapamil SR 180 mg a day to see if this helps with [name]'s symptoms Lets also refer him to [facility] - Dr Sharmon Leyden He has a dynamic LVOT obstruction with an LVOT gradient at rest of 10-15 mm Hg which increases to 50-55 with Valsalva and 120 mm Hg after a PVC.  Marland Kitchen Thanks

## 2017-04-11 NOTE — Telephone Encounter (Signed)
Per Dr. Acie Fredrickson, d/c metoprolol when starting verapamil  Referral called in to Dr. Ricky Ala office for dynamic outflow gradient obstruction/possible septal reduction. Cath report and last ov faxed to [phone].  Appointment for July 26 at 3 pm with Dr. Mina Marble.  Left message for patient to call back.

## 2017-04-12 ENCOUNTER — Other Ambulatory Visit: Payer: Self-pay | Admitting: Physician Assistant

## 2017-04-12 DIAGNOSIS — I421 Obstructive hypertrophic cardiomyopathy: Secondary | ICD-10-CM

## 2017-04-12 NOTE — Telephone Encounter (Signed)
Left message for patient to call back regarding medication change and referral to [facility], Dr. Mina Marble

## 2017-04-14 ENCOUNTER — Telehealth: Payer: Self-pay

## 2017-04-14 NOTE — Telephone Encounter (Signed)
Call placed to patient for post cath f/u.  Call went to VM. Left message including follow up appt 05/02/2017 @ 1100 with L. Servando Snare.  Notified Pt to call office if this appt does not work.

## 2017-04-20 NOTE — Telephone Encounter (Signed)
Left message for patient to call back to discuss plan of care.

## 2017-04-26 ENCOUNTER — Telehealth: Payer: Self-pay | Admitting: Cardiovascular Disease

## 2017-04-26 NOTE — Telephone Encounter (Signed)
Spoke with patient and advised him of need for appointment with Dr. Mina Marble to discuss possible septal reduction for his dynamic outflow gradient obstruction. He states he was advised by [facility] to contact his insurance company and I advised that he will likely need to use that terminology. I advised MRI appointment on 6/22 and appointment with Truitt Merle, NP are not needed due to referral to Dr. Mina Marble. I advised he call back with additional questions or concerns. He verbalized understanding and agreement with plan and thanked me for the call. He apologized for not calling back sooner.

## 2017-04-26 NOTE — Telephone Encounter (Signed)
Left message for patient to call back regarding appointment with Dr. Mina Marble. I have left patient multiple messages to call me back but have not heard from him until today. He will not need the appointment with Truitt Merle on 6/26.

## 2017-04-26 NOTE — Telephone Encounter (Signed)
Pt calling regarding a referral he got in the mail for a Derinda Sis MD, it doesn't say what the referral is for only that Nahser referred, they are also some questions on the form he was sent he needs help answering--pls call [phone] ext [phone] or [phone]

## 2017-04-28 ENCOUNTER — Ambulatory Visit (HOSPITAL_COMMUNITY): Payer: Medicare Other

## 2017-05-02 ENCOUNTER — Ambulatory Visit: Payer: Medicare Other | Admitting: Nurse Practitioner

## 2017-05-04 DIAGNOSIS — E291 Testicular hypofunction: Secondary | ICD-10-CM | POA: Diagnosis not present

## 2017-05-04 DIAGNOSIS — R948 Abnormal results of function studies of other organs and systems: Secondary | ICD-10-CM | POA: Diagnosis not present

## 2017-05-15 DIAGNOSIS — G4733 Obstructive sleep apnea (adult) (pediatric): Secondary | ICD-10-CM | POA: Diagnosis not present

## 2017-06-01 DIAGNOSIS — R931 Abnormal findings on diagnostic imaging of heart and coronary circulation: Secondary | ICD-10-CM | POA: Diagnosis not present

## 2017-06-01 DIAGNOSIS — I2510 Atherosclerotic heart disease of native coronary artery without angina pectoris: Secondary | ICD-10-CM | POA: Diagnosis not present

## 2017-06-01 DIAGNOSIS — I422 Other hypertrophic cardiomyopathy: Secondary | ICD-10-CM | POA: Insufficient documentation

## 2017-06-01 DIAGNOSIS — R9431 Abnormal electrocardiogram [ECG] [EKG]: Secondary | ICD-10-CM | POA: Diagnosis not present

## 2017-06-01 DIAGNOSIS — I340 Nonrheumatic mitral (valve) insufficiency: Secondary | ICD-10-CM | POA: Diagnosis not present

## 2017-06-01 DIAGNOSIS — I493 Ventricular premature depolarization: Secondary | ICD-10-CM | POA: Diagnosis not present

## 2017-06-01 DIAGNOSIS — Z8547 Personal history of malignant neoplasm of testis: Secondary | ICD-10-CM | POA: Diagnosis not present

## 2017-06-01 DIAGNOSIS — R42 Dizziness and giddiness: Secondary | ICD-10-CM | POA: Diagnosis not present

## 2017-06-01 DIAGNOSIS — F319 Bipolar disorder, unspecified: Secondary | ICD-10-CM | POA: Diagnosis not present

## 2017-06-08 DIAGNOSIS — E291 Testicular hypofunction: Secondary | ICD-10-CM | POA: Diagnosis not present

## 2017-06-09 ENCOUNTER — Telehealth: Payer: Self-pay | Admitting: Cardiovascular Disease

## 2017-06-09 NOTE — Telephone Encounter (Signed)
The patient wanted to know if we had received information from his doctor at [facility] regarding him having a coronary angiogram at our office.  I advised that I did not see anything in the paperwork or any info in the computer at this time regarding him having this scan.  He is aware Sharyn Lull is out of the office until Monday and that she will be able to review/call him back at that time.  Per the patient, it has been determined that he is not going to use norpace, and he is going to have a septal ablation at [facility], so his doctor there is requesting the coronary angiogram.

## 2017-06-09 NOTE — Telephone Encounter (Signed)
New message    Pt is calling he has a question about his medication. He is calling to find out if a request has been received about scheduling a procedure from [place], Dr. Mina Marble.

## 2017-06-12 NOTE — Telephone Encounter (Signed)
Spoke with patient who called to request coronary angiography records be sent to [facility]. I spoke with Barbette Reichmann in HIM who advised that the release was received and the CD with the cath images was mailed on 8/3. Patient is aware of this information and thanked me for the call.

## 2017-06-15 DIAGNOSIS — G4733 Obstructive sleep apnea (adult) (pediatric): Secondary | ICD-10-CM | POA: Diagnosis not present

## 2017-07-03 ENCOUNTER — Telehealth: Payer: Self-pay | Admitting: Cardiovascular Disease

## 2017-07-03 NOTE — Telephone Encounter (Signed)
Patient wanted to know if Dr. Acie Fredrickson wanted to see him before his alcohol ablation at [facility] or does he want to see him after his procedure. Patient's procedure is on 07/19/17, and he is schedule for an office visit 07/12/17. Will forward to Dr. Acie Fredrickson for advisement.

## 2017-07-03 NOTE — Telephone Encounter (Signed)
New message    Pt is calling to find out if he needs to come in for this appt on 9/5. He said he is due to have a procedure at [facility] on 9/12. Please call.

## 2017-07-04 NOTE — Telephone Encounter (Signed)
Left message for patient to call back to reschedule appointment with Dr. Acie Fredrickson for late September or early October after his ablation.

## 2017-07-04 NOTE — Telephone Encounter (Signed)
If he is stable, he can come in after the ablation

## 2017-07-07 NOTE — Telephone Encounter (Signed)
Spoke with patient and was able to schedule him for October 5. He thanked me for the call.

## 2017-07-07 NOTE — Telephone Encounter (Signed)
Spoke with patient and advised that per Dr. Acie Fredrickson, he may reschedule office visit for end of September or early October. He states he will call back to reschedule when he gets to work so that he can look at his calendar. He thanked me for the call.

## 2017-07-07 NOTE — Telephone Encounter (Signed)
Follow up    Pt is calling asking for a call back about appt in late September, early October. Please call.

## 2017-07-12 ENCOUNTER — Ambulatory Visit: Payer: Medicare Other | Admitting: Cardiovascular Disease

## 2017-07-19 DIAGNOSIS — R918 Other nonspecific abnormal finding of lung field: Secondary | ICD-10-CM | POA: Diagnosis not present

## 2017-07-19 DIAGNOSIS — I421 Obstructive hypertrophic cardiomyopathy: Secondary | ICD-10-CM | POA: Diagnosis not present

## 2017-07-19 DIAGNOSIS — E785 Hyperlipidemia, unspecified: Secondary | ICD-10-CM | POA: Diagnosis present

## 2017-07-19 DIAGNOSIS — G4733 Obstructive sleep apnea (adult) (pediatric): Secondary | ICD-10-CM | POA: Diagnosis present

## 2017-07-19 DIAGNOSIS — I2510 Atherosclerotic heart disease of native coronary artery without angina pectoris: Secondary | ICD-10-CM | POA: Diagnosis present

## 2017-07-19 DIAGNOSIS — N183 Chronic kidney disease, stage 3 (moderate): Secondary | ICD-10-CM | POA: Diagnosis present

## 2017-07-19 DIAGNOSIS — F319 Bipolar disorder, unspecified: Secondary | ICD-10-CM | POA: Diagnosis present

## 2017-07-19 DIAGNOSIS — R55 Syncope and collapse: Secondary | ICD-10-CM | POA: Diagnosis not present

## 2017-07-19 DIAGNOSIS — R9431 Abnormal electrocardiogram [ECG] [EKG]: Secondary | ICD-10-CM | POA: Diagnosis not present

## 2017-07-19 DIAGNOSIS — Z9889 Other specified postprocedural states: Secondary | ICD-10-CM | POA: Diagnosis not present

## 2017-07-19 DIAGNOSIS — I4510 Unspecified right bundle-branch block: Secondary | ICD-10-CM | POA: Diagnosis not present

## 2017-07-19 DIAGNOSIS — I2582 Chronic total occlusion of coronary artery: Secondary | ICD-10-CM | POA: Diagnosis present

## 2017-07-19 DIAGNOSIS — Z8547 Personal history of malignant neoplasm of testis: Secondary | ICD-10-CM | POA: Diagnosis not present

## 2017-07-19 DIAGNOSIS — I422 Other hypertrophic cardiomyopathy: Secondary | ICD-10-CM | POA: Diagnosis not present

## 2017-07-24 ENCOUNTER — Telehealth: Payer: Self-pay | Admitting: Cardiovascular Disease

## 2017-07-24 NOTE — Telephone Encounter (Signed)
Spoke with patient who states he is recovering well from alcohol ablation for HOCM at [facility] on 9/12. He states he is feeling well and was told to d/c Verapamil and start Toprol XL 100 mg daily. I have updated his med list.  He states Dr. Mina Marble wanted him to follow up with Dr. Acie Fredrickson in 4 days. I advised that Dr. Elmarie Shiley earliest appointment is Monday 9/24 and he verbalized understanding and agreement to come in on that day. He states there is a test Dr. Mina Marble wanted him to have done at our office but he cannot remember what it is. I advised him to call back if he finds that information. He thanked me for the call.

## 2017-07-24 NOTE — Telephone Encounter (Signed)
New Message  Pt call requesting to speak with RN about getting a sooner appt than schedule with only Dr. Acie Fredrickson for f/u from his ablation. Please call back to discuss

## 2017-07-25 ENCOUNTER — Inpatient Hospital Stay (HOSPITAL_COMMUNITY)
Admission: EM | Admit: 2017-07-25 | Discharge: 2017-07-27 | DRG: 242 | Disposition: A | Discharge status: Home / Self Care | Payer: Medicare Other | Attending: Cardiology | Admitting: Cardiology

## 2017-07-25 ENCOUNTER — Encounter (HOSPITAL_COMMUNITY)
Admission: EM | Disposition: A | Discharge status: Home / Self Care | Payer: Self-pay | Source: Home / Self Care | Attending: Cardiology

## 2017-07-25 ENCOUNTER — Emergency Department (HOSPITAL_COMMUNITY): Payer: Medicare Other

## 2017-07-25 ENCOUNTER — Encounter: Payer: Self-pay | Admitting: Cardiology

## 2017-07-25 ENCOUNTER — Encounter (HOSPITAL_COMMUNITY): Payer: Self-pay | Admitting: Cardiology

## 2017-07-25 ENCOUNTER — Inpatient Hospital Stay (HOSPITAL_COMMUNITY): Payer: Medicare Other

## 2017-07-25 DIAGNOSIS — R001 Bradycardia, unspecified: Secondary | ICD-10-CM | POA: Diagnosis present

## 2017-07-25 DIAGNOSIS — F329 Major depressive disorder, single episode, unspecified: Secondary | ICD-10-CM | POA: Diagnosis present

## 2017-07-25 DIAGNOSIS — Z8547 Personal history of malignant neoplasm of testis: Secondary | ICD-10-CM | POA: Diagnosis not present

## 2017-07-25 DIAGNOSIS — Y9289 Other specified places as the place of occurrence of the external cause: Secondary | ICD-10-CM | POA: Diagnosis not present

## 2017-07-25 DIAGNOSIS — Z950 Presence of cardiac pacemaker: Secondary | ICD-10-CM | POA: Diagnosis not present

## 2017-07-25 DIAGNOSIS — W07XXXA Fall from chair, initial encounter: Secondary | ICD-10-CM | POA: Diagnosis present

## 2017-07-25 DIAGNOSIS — E871 Hypo-osmolality and hyponatremia: Secondary | ICD-10-CM | POA: Diagnosis present

## 2017-07-25 DIAGNOSIS — J9811 Atelectasis: Secondary | ICD-10-CM | POA: Diagnosis present

## 2017-07-25 DIAGNOSIS — Z23 Encounter for immunization: Secondary | ICD-10-CM

## 2017-07-25 DIAGNOSIS — R570 Cardiogenic shock: Secondary | ICD-10-CM

## 2017-07-25 DIAGNOSIS — E785 Hyperlipidemia, unspecified: Secondary | ICD-10-CM | POA: Diagnosis present

## 2017-07-25 DIAGNOSIS — I469 Cardiac arrest, cause unspecified: Secondary | ICD-10-CM | POA: Diagnosis present

## 2017-07-25 DIAGNOSIS — Z4682 Encounter for fitting and adjustment of non-vascular catheter: Secondary | ICD-10-CM | POA: Diagnosis not present

## 2017-07-25 DIAGNOSIS — I421 Obstructive hypertrophic cardiomyopathy: Secondary | ICD-10-CM | POA: Diagnosis present

## 2017-07-25 DIAGNOSIS — Z888 Allergy status to other drugs, medicaments and biological substances status: Secondary | ICD-10-CM

## 2017-07-25 DIAGNOSIS — N179 Acute kidney failure, unspecified: Secondary | ICD-10-CM | POA: Diagnosis present

## 2017-07-25 DIAGNOSIS — J9601 Acute respiratory failure with hypoxia: Secondary | ICD-10-CM | POA: Diagnosis not present

## 2017-07-25 DIAGNOSIS — I340 Nonrheumatic mitral (valve) insufficiency: Secondary | ICD-10-CM | POA: Diagnosis not present

## 2017-07-25 DIAGNOSIS — I213 ST elevation (STEMI) myocardial infarction of unspecified site: Secondary | ICD-10-CM | POA: Diagnosis not present

## 2017-07-25 DIAGNOSIS — R918 Other nonspecific abnormal finding of lung field: Secondary | ICD-10-CM | POA: Diagnosis not present

## 2017-07-25 DIAGNOSIS — I442 Atrioventricular block, complete: Principal | ICD-10-CM | POA: Diagnosis present

## 2017-07-25 DIAGNOSIS — G4733 Obstructive sleep apnea (adult) (pediatric): Secondary | ICD-10-CM | POA: Diagnosis present

## 2017-07-25 DIAGNOSIS — E876 Hypokalemia: Secondary | ICD-10-CM | POA: Diagnosis present

## 2017-07-25 DIAGNOSIS — J90 Pleural effusion, not elsewhere classified: Secondary | ICD-10-CM | POA: Diagnosis not present

## 2017-07-25 HISTORY — PX: TEMPORARY PACEMAKER: CATH118268

## 2017-07-25 LAB — CBC WITH DIFFERENTIAL/PLATELET
Basophils Absolute: 0 10*3/uL (ref 0.0–0.1)
Basophils Relative: 0 %
EOS PCT: 4 %
Eosinophils Absolute: 0.3 10*3/uL (ref 0.0–0.7)
HEMATOCRIT: 35.7 % — AB (ref 39.0–52.0)
HEMOGLOBIN: 12.5 g/dL — AB (ref 13.0–17.0)
LYMPHS ABS: 2.9 10*3/uL (ref 0.7–4.0)
LYMPHS PCT: 40 %
MCH: 30.1 pg (ref 26.0–34.0)
MCHC: 35 g/dL (ref 30.0–36.0)
MCV: 86 fL (ref 78.0–100.0)
MONOS PCT: 13 %
Monocytes Absolute: 0.9 10*3/uL (ref 0.1–1.0)
NEUTROS PCT: 43 %
Neutro Abs: 3.1 10*3/uL (ref 1.7–7.7)
Platelets: 309 10*3/uL (ref 150–400)
RBC: 4.15 MIL/uL — AB (ref 4.22–5.81)
RDW: 13.1 % (ref 11.5–15.5)
WBC: 7.2 10*3/uL (ref 4.0–10.5)

## 2017-07-25 LAB — COMPREHENSIVE METABOLIC PANEL
ALBUMIN: 3.8 g/dL (ref 3.5–5.0)
ALT: 49 U/L (ref 17–63)
AST: 56 U/L — AB (ref 15–41)
Alkaline Phosphatase: 60 U/L (ref 38–126)
Anion gap: 12 (ref 5–15)
BUN: 14 mg/dL (ref 6–20)
CHLORIDE: 97 mmol/L — AB (ref 101–111)
CO2: 23 mmol/L (ref 22–32)
CREATININE: 1.6 mg/dL — AB (ref 0.61–1.24)
Calcium: 8.7 mg/dL — ABNORMAL LOW (ref 8.9–10.3)
GFR calc Af Amer: 49 mL/min — ABNORMAL LOW (ref >60)
GFR calc non Af Amer: 42 mL/min — ABNORMAL LOW (ref >60)
Glucose, Bld: 173 mg/dL — ABNORMAL HIGH (ref 65–99)
Potassium: 3.2 mmol/L — ABNORMAL LOW (ref 3.5–5.1)
SODIUM: 132 mmol/L — AB (ref 135–145)
Total Bilirubin: 0.7 mg/dL (ref 0.3–1.2)
Total Protein: 6.7 g/dL (ref 6.5–8.1)

## 2017-07-25 LAB — TROPONIN I
TROPONIN I: 3.19 ng/mL — AB (ref <0.03)
Troponin I: 3.82 ng/mL (ref <0.03)

## 2017-07-25 LAB — ECHOCARDIOGRAM COMPLETE
AOPV: 0.89 m/s
AV Area VTI: 2.54 cm2
AV Peak grad: 12 mmHg
AVPKVEL: 170 cm/s
Area-P 1/2: 2.93 cm2
CHL CUP AV PEAK INDEX: 1.21
CHL CUP DOP CALC LVOT VTI: 19.8 cm
CHL CUP MV DEC (S): 257
CHL CUP TV REG PEAK VELOCITY: 248 cm/s
EERAT: 0.75
EWDT: 257 ms
FS: 34 % (ref 28–44)
HEIGHTINCHES: 73 in
IV/PV OW: 1.2
LA diam index: 1.95 cm/m2
LASIZE: 41 mm
LAVOLA4C: 78.1 mL
LDCA: 2.84 cm2
LEFT ATRIUM END SYS DIAM: 41 mm
LV PW d: 18.6 mm — AB (ref 0.6–1.1)
LV e' LATERAL: 7.14 cm/s
LVEEAVG: 0.75
LVEEMED: 0.75
LVOT SV: 56 mL
LVOT peak grad rest: 9 mmHg
LVOTD: 19 mm
LVOTPV: 152 cm/s
Lateral S' vel: 11 cm/s
MV pk E vel: 5.35 m/s
MVPKAVEL: 84.9 m/s
MVSPHT: 75 ms
RV sys press: 28 mmHg
TAPSE: 24.4 mm
TDI e' lateral: 7.14
TR max vel: 248 cm/s
Weight: 2998.26 oz

## 2017-07-25 LAB — I-STAT CHEM 8, ED
BUN: 16 mg/dL (ref 6–20)
CREATININE: 1.4 mg/dL — AB (ref 0.61–1.24)
Calcium, Ion: 1.04 mmol/L — ABNORMAL LOW (ref 1.15–1.40)
Chloride: 97 mmol/L — ABNORMAL LOW (ref 101–111)
Glucose, Bld: 177 mg/dL — ABNORMAL HIGH (ref 65–99)
HCT: 36 % — ABNORMAL LOW (ref 39.0–52.0)
HEMOGLOBIN: 12.2 g/dL — AB (ref 13.0–17.0)
POTASSIUM: 3.4 mmol/L — AB (ref 3.5–5.1)
SODIUM: 134 mmol/L — AB (ref 135–145)
TCO2: 26 mmol/L (ref 22–32)

## 2017-07-25 LAB — LIPID PANEL
CHOL/HDL RATIO: 3 ratio
CHOLESTEROL: 157 mg/dL (ref 0–200)
HDL: 53 mg/dL (ref >40)
LDL Cholesterol: 87 mg/dL (ref 0–99)
Triglycerides: 85 mg/dL (ref <150)
VLDL: 17 mg/dL (ref 0–40)

## 2017-07-25 LAB — BASIC METABOLIC PANEL
Anion gap: 10 (ref 5–15)
BUN: 15 mg/dL (ref 6–20)
CHLORIDE: 98 mmol/L — AB (ref 101–111)
CO2: 23 mmol/L (ref 22–32)
Calcium: 8.3 mg/dL — ABNORMAL LOW (ref 8.9–10.3)
Creatinine, Ser: 1.35 mg/dL — ABNORMAL HIGH (ref 0.61–1.24)
GFR calc Af Amer: 60 mL/min — ABNORMAL LOW (ref >60)
GFR calc non Af Amer: 52 mL/min — ABNORMAL LOW (ref >60)
GLUCOSE: 120 mg/dL — AB (ref 65–99)
POTASSIUM: 4.2 mmol/L (ref 3.5–5.1)
Sodium: 131 mmol/L — ABNORMAL LOW (ref 135–145)

## 2017-07-25 LAB — PROTIME-INR
INR: 1.03
Prothrombin Time: 13.4 seconds (ref 11.4–15.2)

## 2017-07-25 LAB — I-STAT CG4 LACTIC ACID, ED: Lactic Acid, Venous: 3.76 mmol/L (ref 0.5–1.9)

## 2017-07-25 LAB — SURGICAL PCR SCREEN
MRSA, PCR: NEGATIVE
Staphylococcus aureus: NEGATIVE

## 2017-07-25 LAB — I-STAT TROPONIN, ED: Troponin i, poc: 4.22 ng/mL (ref 0.00–0.08)

## 2017-07-25 LAB — LACTIC ACID, PLASMA
LACTIC ACID, VENOUS: 0.7 mmol/L (ref 0.5–1.9)
Lactic Acid, Venous: 1.1 mmol/L (ref 0.5–1.9)

## 2017-07-25 LAB — PROCALCITONIN: Procalcitonin: <0.1 ng/mL

## 2017-07-25 LAB — APTT: APTT: 32 s (ref 24–36)

## 2017-07-25 SURGERY — TEMPORARY PACEMAKER
Anesthesia: LOCAL

## 2017-07-25 MED ORDER — CEFAZOLIN SODIUM-DEXTROSE 2-4 GM/100ML-% IV SOLN
2.0000 g | INTRAVENOUS | Status: AC
  Administered 2017-07-26: 2 g via INTRAVENOUS
  Filled 2017-07-25: qty 100

## 2017-07-25 MED ORDER — ETOMIDATE 2 MG/ML IV SOLN
INTRAVENOUS | Status: DC | PRN
  Administered 2017-07-25: 20 mg via INTRAVENOUS

## 2017-07-25 MED ORDER — LIDOCAINE HCL (PF) 1 % IJ SOLN
INTRAMUSCULAR | Status: DC | PRN
  Administered 2017-07-25: 15 mL

## 2017-07-25 MED ORDER — INFLUENZA VAC SPLIT HIGH-DOSE 0.5 ML IM SUSY
0.5000 mL | PREFILLED_SYRINGE | INTRAMUSCULAR | Status: DC
  Filled 2017-07-25: qty 0.5

## 2017-07-25 MED ORDER — MIDAZOLAM HCL 2 MG/2ML IJ SOLN
INTRAMUSCULAR | Status: DC | PRN
  Administered 2017-07-25: 4 mg via INTRAVENOUS

## 2017-07-25 MED ORDER — SUCCINYLCHOLINE CHLORIDE 20 MG/ML IJ SOLN
INTRAMUSCULAR | Status: DC | PRN
  Administered 2017-07-25: 100 mg via INTRAVENOUS

## 2017-07-25 MED ORDER — BUPROPION HCL ER (SR) 100 MG PO TB12
100.0000 mg | ORAL_TABLET | Freq: Two times a day (BID) | ORAL | Status: DC
  Administered 2017-07-25 – 2017-07-27 (×3): 100 mg via ORAL
  Filled 2017-07-25 (×6): qty 1

## 2017-07-25 MED ORDER — ASPIRIN 81 MG PO CHEW
81.0000 mg | CHEWABLE_TABLET | Freq: Every day | ORAL | Status: DC
  Administered 2017-07-27: 81 mg via ORAL
  Filled 2017-07-25: qty 1

## 2017-07-25 MED ORDER — SODIUM CHLORIDE 0.9 % IV SOLN
INTRAVENOUS | Status: DC
  Administered 2017-07-26: 06:00:00 via INTRAVENOUS

## 2017-07-25 MED ORDER — SODIUM CHLORIDE 0.9 % IR SOLN
80.0000 mg | Status: AC
  Administered 2017-07-26: 80 mg
  Filled 2017-07-25: qty 2

## 2017-07-25 MED ORDER — QUETIAPINE FUMARATE ER 200 MG PO TB24
200.0000 mg | ORAL_TABLET | Freq: Every day | ORAL | Status: DC
  Administered 2017-07-25 – 2017-07-26 (×2): 200 mg via ORAL
  Filled 2017-07-25 (×3): qty 1

## 2017-07-25 MED ORDER — MIDAZOLAM HCL 2 MG/2ML IJ SOLN
INTRAMUSCULAR | Status: AC
  Filled 2017-07-25: qty 2

## 2017-07-25 MED ORDER — HEPARIN (PORCINE) IN NACL 2-0.9 UNIT/ML-% IJ SOLN
INTRAMUSCULAR | Status: AC | PRN
  Administered 2017-07-25: 500 mL

## 2017-07-25 MED ORDER — LIDOCAINE HCL 2 % IJ SOLN
INTRAMUSCULAR | Status: AC
  Filled 2017-07-25: qty 10

## 2017-07-25 MED ORDER — ASPIRIN 300 MG RE SUPP
300.0000 mg | Freq: Once | RECTAL | Status: AC
  Administered 2017-07-25: 300 mg via RECTAL

## 2017-07-25 MED ORDER — PROPOFOL 1000 MG/100ML IV EMUL
INTRAVENOUS | Status: DC | PRN
  Administered 2017-07-25: 10 ug/kg/min via INTRAVENOUS
  Administered 2017-07-25: 13:00:00 via INTRAVENOUS

## 2017-07-25 MED ORDER — CALCIUM GLUCONATE 10 % IV SOLN
1.0000 g | Freq: Once | INTRAVENOUS | Status: AC
  Administered 2017-07-26: 1 g via INTRAVENOUS
  Filled 2017-07-25: qty 10

## 2017-07-25 MED ORDER — PRAVASTATIN SODIUM 40 MG PO TABS
40.0000 mg | ORAL_TABLET | Freq: Every day | ORAL | Status: DC
  Administered 2017-07-26: 40 mg via ORAL
  Filled 2017-07-25: qty 1

## 2017-07-25 MED ORDER — HEPARIN (PORCINE) IN NACL 2-0.9 UNIT/ML-% IJ SOLN
INTRAMUSCULAR | Status: AC
  Filled 2017-07-25: qty 500

## 2017-07-25 MED ORDER — HEPARIN SODIUM (PORCINE) 5000 UNIT/ML IJ SOLN: 4000.0000 [IU] | Freq: Once | INTRAMUSCULAR | Status: DC

## 2017-07-25 MED ORDER — PROPOFOL 1000 MG/100ML IV EMUL
INTRAVENOUS | Status: AC
  Filled 2017-07-25: qty 100

## 2017-07-25 MED ORDER — CHLORHEXIDINE GLUCONATE 4 % EX LIQD
60.0000 mL | Freq: Once | CUTANEOUS | Status: AC
  Administered 2017-07-26: 4 via TOPICAL
  Filled 2017-07-25: qty 60

## 2017-07-25 MED ORDER — LAMOTRIGINE 100 MG PO TABS
200.0000 mg | ORAL_TABLET | Freq: Every day | ORAL | Status: DC
  Administered 2017-07-27: 200 mg via ORAL
  Filled 2017-07-25 (×2): qty 2

## 2017-07-25 MED ORDER — DEXMEDETOMIDINE HCL 200 MCG/2ML IV SOLN
0.4000 ug/kg/h | INTRAVENOUS | Status: DC
  Filled 2017-07-25: qty 2

## 2017-07-25 MED ORDER — EPINEPHRINE PF 1 MG/10ML IJ SOSY
PREFILLED_SYRINGE | INTRAMUSCULAR | Status: DC | PRN
  Administered 2017-07-25: 1 via INTRAVENOUS

## 2017-07-25 SURGICAL SUPPLY — 8 items
CABLE ADAPT CONN TEMP 6FT (ADAPTER) ×2 IMPLANT
CATH S G BIP PACING (SET/KITS/TRAYS/PACK) ×2 IMPLANT
COVER PRB 48X5XTLSCP FOLD TPE (BAG) ×1 IMPLANT
COVER PROBE 5X48 (BAG) ×1
HOVERMATT SINGLE USE (MISCELLANEOUS) ×2 IMPLANT
PACK CARDIAC CATHETERIZATION (CUSTOM PROCEDURE TRAY) ×2 IMPLANT
SHEATH PINNACLE 6F 10CM (SHEATH) ×4 IMPLANT
SLEEVE REPOSITIONING LENGTH 30 (MISCELLANEOUS) ×2 IMPLANT

## 2017-07-25 NOTE — ED Notes (Signed)
Pt name is Michael Barton

## 2017-07-25 NOTE — Progress Notes (Signed)
Pt currently has no code status on file.  MD notified and asked to speak with patient.  Mosie Lukes, RN  07/25/17  2130

## 2017-07-25 NOTE — Consult Note (Signed)
ELECTROPHYSIOLOGY CONSULT NOTE    Patient ID: Michael Barton MRN: [id number], DOB/AGE: [birth year] 71 y.o.  Admit date: 07/25/2017 Date of Consult: 07/25/2017  Primary Physician: Gaynelle Arabian, MD Primary Cardiologist: Nahser  Patient Profile: Michael Barton is a 71 y.o. male with a history of HOCM, hyperlipidemia, and OSA who is being seen today for the evaluation of complete heart block at the request of Dr Martinique.  HPI:  Michael Barton is a 71 y.o. male who underwent alcohol septal ablation last week. Post procedure, he had a new RBBB and was discharged on BB. 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 EMS was called and he was initially in an agonal rhythm.  CPR was initiated and he regained pulse and blood pressure. He was transported to [facility] where he had recurrent intermittent complete heart block and was taken urgently for temp-pacemaker placement. He has been intubated and ROS is limited. History is obtained from chart.    Past Medical History:  Diagnosis Date  . Depression   . HOCM (hypertrophic obstructive cardiomyopathy) ([place])   . Hyperlipidemia   . Sleep apnea   . Testicular cancer [facility])      Surgical History: No past surgical history on file.   No prescriptions prior to admission.    Inpatient Medications:   Allergies: No Known Allergies  Social History   Social History  . Marital status: Single    Spouse name: N/A  . Number of children: N/A  . Years of education: N/A   Occupational History  . Not on file.   Social History Main Topics  . Smoking status: Never Smoker  . Smokeless tobacco: Never Used  . Alcohol use No  . Drug use: No  . Sexual activity: Not on file   Other Topics Concern  . Not on file   Social History Narrative  . No narrative on file     Family History  Problem Relation Age of Onset  . Thyroid disease Mother   . Depression Mother   . Cancer Father      Review of  Systems: All other systems reviewed and are otherwise negative except as noted above.  Physical Exam: Vitals:   07/25/17 1308 07/25/17 1312 07/25/17 1317 07/25/17 1321  BP: 131/76 97/61 (!) 87/53 (!) 87/53  Pulse: (!) 164 78 78 76  Resp: 20 17 17 18   SpO2: 100% 100% 100% 100%  Weight:      Height:        GEN- The patient is well appearing, intubated and sedated HEENT: normocephalic, atraumatic; sclera clear, conjunctiva pink; hearing intact; +ETT Lungs- +vent  Heart- Regular rate and rhythm  GI- soft, non-tender, non-distended, bowel sounds present Extremities- no clubbing, cyanosis, or edema  MS- no significant deformity or atrophy Skin- warm and dry, no rash or lesion Psych- euthymic mood, full affect Neuro- strength and sensation are intact  Labs:  Lab Results  Component Value Date   WBC 7.2 07/25/2017   HGB 12.2 (L) 07/25/2017   HCT 36.0 (L) 07/25/2017   MCV 86.0 07/25/2017   PLT 309 07/25/2017    Recent Labs Lab 07/25/17 1145 07/25/17 1208  NA 132* 134*  K 3.2* 3.4*  CL 97* 97*  CO2 23  --   BUN 14 16  CREATININE 1.60* 1.40*  CALCIUM 8.7*  --   PROT 6.7  --   BILITOT 0.7  --   ALKPHOS 60  --  ALT 49  --   AST 56*  --   GLUCOSE 173* 177*      Radiology/Studies: Dg Chest Port 1 View  Result Date: 07/25/2017 CLINICAL DATA:  71 year old male status post cardiopulmonary arrest and resuscitation including cardioversion, intubation EXAM: PORTABLE CHEST 1 VIEW COMPARISON:  Chest radiographs 03/24/2008. FINDINGS: Portable AP supine view at 1206 hours. Endotracheal tube tip in good position just below the level the clavicles. Enteric tube courses to the left upper quadrant, tip not included. Mildly low lung volumes. Mediastinal contours appear stable when allowing for portable technique. Mild streaky left perihilar and retrocardiac opacity. No pneumothorax, pulmonary edema, or definite effusion. No confluent right lung opacity. No displaced rib fracture or No acute  osseous abnormality identified. IMPRESSION: 1. Endotracheal tube tip in good position. Enteric tube courses to the abdomen, tip not included. 2. Streaky left perihilar and lung base opacity is nonspecific, but favor atelectasis. 3. No other acute cardiopulmonary abnormality identified. Electronically Signed   By: Genevie Ann M.D.   On: 07/25/2017 12:51    DGU:[id number] rhythm, 1st degree AV block, RBBB (new from prior to ablation) (personally reviewed)  TELEMETRY: sinus rhythm, intermittent complete heart block, intermittent V pacing (personally reviewed)  Assessment/Plan: 1.  Intermittent complete heart block The patient presents with symptomatic intermittent complete heart block 6 days after alcohol septal ablation at [facility]. Post procedure there, he had a new RBBB but did not have any AV block and did not require pacing. He has been on Metoprolol at home. He is currently intubated with temp pacing wire in place. Dr Rayann Heman called [facility] and spoke with Dr Mina Marble. Dr Mina Marble feels that he needs BB long term for management of HOCM.  He did not feel like he had risk factors that required ICD implant at this time.  Will plan for pacemaker implant tomorrow with Dr Lovena Le pending conversation with [facility].   2.  HOCM S/p alcohol septal ablation Managed at [facility], Chanetta Marshall, NP 07/25/2017 2:15 PM   I have seen, examined the patient, and reviewed the above assessment and plan.  On exam, sedated on the vent.  RRR.  Changes to above are made where necessary.  Pt with HOCM s/p septal ablation 07/19/17.  Noted to have RBBB post operatively but no AV block.  No admitted with symptomatic transient complete heart block with syncope and subsequent cardiac arrest requiring CPR and emergent temporary pacing wire placement.  Now stable. I have spoken with Dr Mina Marble at [facility] who knows the patient well.  He and I agree that the patient requires metoprolol long term for treatment of his HOCM.  We also agree that given septal  ablation through a large septal perforator vessel, the patients conduction system is not reliable going forward and pacemaker implantation is indicated.  The patient has septal thickness by MRI (Reviewed) of 2 cm.  He does not have other risk factors for sudden death and therefore Dr Mina Marble advises PPM over ICD.  I think that this is very reasonable. If extubated and doing well tomorrow, would plan to proceed with PPM tomorrow.  Avoid central lines in the interim.  Very complicated patient.  A high level of decision making is required.  Co Sign: Thompson Grayer, MD 07/25/2017 2:49 PM

## 2017-07-25 NOTE — ED Notes (Signed)
Pt paced by zole, taken to cath lab 3 by this nurse, nt, and respiratory

## 2017-07-25 NOTE — ED Notes (Signed)
Pt shakes head no to any allergies

## 2017-07-25 NOTE — ED Notes (Signed)
Pt lost pulses, CPR started immediately

## 2017-07-25 NOTE — ED Notes (Signed)
Pt lost pulses

## 2017-07-25 NOTE — ED Notes (Signed)
Unable to complete rest of triage, pt barely responsive

## 2017-07-25 NOTE — Progress Notes (Signed)
MD paged again pertaining to lack of code status.  Pt's chart marked for merge shows prior FULL code status as 04/10/2017.  Per MD, patient to remain full code for the night, unless patient requests otherwise.  Code status to be readdressed in the morning by attending physician.

## 2017-07-25 NOTE — ED Provider Notes (Signed)
[facility] DEPT Provider Note   CSN: [id number] Arrival date & time: 07/25/17  1135   History   Chief Complaint Chief Complaint  Patient presents with  . Code STEMI  . Cardiac Arrest    HPI Michael Barton is a 71 y.o. male.  HPI   Patient called the ER for syncopal episode. Witnessed, fall at a tire stroke. He was in a chair and reportedly slipped out of it. He lost pulses en route and CPR was initiated, pulses returned. He did received approx 1 minute of Lucas compressions. He was found to have narrow complex tachycardia, cardioverted to sinus tachycardia 100-120 then had some convulsing, became combative, was given 2.5mg  Versed.  On arrival the patient is slurring his speech and unintelligible. HE is unable to provide any further history. EKG shows concern for STEMI therefore CODE STEMI called and Dr. Martinique shortly arrived thereafter. He started not protecting is airway and gagging therefore RSI was initiated and patient emergently intubated by Dr. Vanita Panda.  No past medical history on file.  There are no active problems to display for this patient.   No past surgical history on file.     Home Medications    Prior to Admission medications   Not on File    Family History No family history on file.  Social History Social History  Substance Use Topics  . Smoking status: Not on file  . Smokeless tobacco: Not on file  . Alcohol use Not on file     Allergies   Patient has no known allergies.   Review of Systems Review of Systems Level V caveat- AMS  Physical Exam Updated Vital Signs BP (!) 166/88   Pulse (!) 101   Resp (!) 23   Ht 6\' 1"  (1.854 m)   Wt 85 kg (187 lb 6.3 oz)   SpO2 100%   BMI 24.72 kg/m   Physical Exam  Constitutional: He appears well-developed and well-nourished. He appears distressed.  HENT:  Head: Normocephalic and atraumatic.  Eyes: Pupils are equal, round, and reactive to light.  Cardiovascular: Tachycardia present.     Pulmonary/Chest:  Gagging, not protecting airway  Abdominal: Soft.  Non distended  Neurological: He is unresponsive.  intubated  Skin: Skin is warm. He is diaphoretic.  Nursing note and vitals reviewed.   ED Treatments / Results  Labs (all labs ordered are listed, but only abnormal results are displayed) Labs Reviewed  CBC WITH DIFFERENTIAL/PLATELET  PROTIME-INR  APTT  COMPREHENSIVE METABOLIC PANEL  TROPONIN I  LIPID PANEL  LIPID PANEL  I-STAT CG4 LACTIC ACID, ED  I-STAT CHEM 8, ED  I-STAT TROPONIN, ED    EKG  EKG Interpretation None       Radiology No results found.  Procedures Procedures (including critical care time)  Medications Ordered in ED Medications  heparin injection 4,000 Units (not administered)  etomidate (AMIDATE) injection (20 mg Intravenous Given 07/25/17 1147)  succinylcholine (ANECTINE) injection (100 mg Intravenous Given 07/25/17 1149)  dexmedetomidine (PRECEDEX) 200 mcg in sodium chloride 0.9 % 50 mL (4 mcg/mL) infusion (not administered)  aspirin suppository 300 mg (300 mg Rectal Given 07/25/17 1155)     Initial Impression / Assessment and Plan / ED Course  I have reviewed the triage vital signs and the nursing notes.  Pertinent labs & imaging results that were available during my care of the patient were reviewed by me and considered in my medical decision making (see chart for details).     11:57 pm  patient coded again, pulses lost. Intubated. CPR initiated.  12:03 pm Pulses returned, will admit to Critical Care.   Final Clinical Impressions(s) / ED Diagnoses   Final diagnoses:  Cardiac arrest [facility])    New Prescriptions New Prescriptions   No medications on file     Delos Haring, Hershal Coria 07/25/17 1206    Carmin Muskrat, MD 07/25/17 1623

## 2017-07-25 NOTE — ED Notes (Signed)
Pulses returned

## 2017-07-25 NOTE — Code Documentation (Signed)
Pt pulses are back, strong pedal pulses

## 2017-07-25 NOTE — Progress Notes (Signed)
  Echocardiogram 2D Echocardiogram has been performed.  Carlota Philley T Ricci Dirocco 07/25/2017, 4:29 PM

## 2017-07-25 NOTE — Progress Notes (Signed)
Orthopedic Tech Progress Note Patient Details:  Michael Barton [birth year] [id number]  Ortho Devices Type of Ortho Device: Knee Immobilizer Ortho Device/Splint Interventions: Application   [name] 07/25/2017, 2:48 PM

## 2017-07-25 NOTE — Consult Note (Signed)
PULMONARY / CRITICAL CARE MEDICINE   Name: Michael Barton MRN: [id number] DOB: 11/14/1945    ADMISSION DATE:  07/25/2017 CONSULTATION DATE:  07/25/17  REFERRING MD:  Vanita Panda  CHIEF COMPLAINT:  Cardiac Arrest  HISTORY OF PRESENT ILLNESS:  Pt is encephelopathic; therefore, this HPI is obtained from chart review. Michael Barton is a 71 y.o. male with PMH as outlined below. EMS was called after a witnessed fall while patient was at a tire store. She was apparently sitting in a chair when she fell out of it. She was pulseless on the scene and received one round of CPR with return of pulses; however, patient was pulseless again and required cardioversion for narrow complex tachycardia and additional CPR.  Upon arrival to ED, he had recurrent arrest and had profound bradycardia. He was subsequently intubated due to inability to protect airway and was taken to the cath lab emergently by cardiology where he had temp pacer placed.  PAST MEDICAL HISTORY :  He  has no past medical history on file.  PAST SURGICAL HISTORY: He  has no past surgical history on file.  No Known Allergies  No current facility-administered medications on file prior to encounter.    No current outpatient prescriptions on file prior to encounter.    FAMILY HISTORY:  His has no family status information on file.    SOCIAL HISTORY: He    REVIEW OF SYSTEMS:  Unable to obtain as patient is encephalopathic.  SUBJECTIVE: On vent, unresponsive.  VITAL SIGNS: BP (!) 213/94   Pulse (!) 101   Resp 17   Ht 6\' 1"  (1.854 m)   Wt 85 kg (187 lb 6.3 oz)   SpO2 99%   BMI 24.72 kg/m   HEMODYNAMICS:    VENTILATOR SETTINGS: Vent Mode: PRVC FiO2 (%):  [100 %] 100 % Set Rate:  [18 bmp] 18 bmp Vt Set:  [580 mL] 580 mL PEEP:  [5 cmH20] 5 cmH20 Plateau Pressure:  [16 cmH20] 16 cmH20  INTAKE / OUTPUT: No intake/output data recorded.   PHYSICAL EXAMINATION: General: Adult male, critically ill. Neuro:  Sedated, non-responsive. HEENT: [place]/AT. Pupils blown, sclerae anicteric. Cardiovascular: RRR, no M/R/G.  Flail chest. Lungs: Respirations even and unlabored.  Crackles throughout. Abdomen: BS x 4, soft, NT/ND.  Musculoskeletal: No gross deformities, no edema.  Skin: Intact, warm, no rashes.  LABS:  BMET  Recent Labs Lab 07/25/17 1208  NA 134*  K 3.4*  CL 97*  BUN 16  CREATININE 1.40*  GLUCOSE 177*    Electrolytes No results for input(s): CALCIUM, MG, PHOS in the last 168 hours.  CBC  Recent Labs Lab 07/25/17 1145 07/25/17 1208  WBC 7.2  --   HGB 12.5* 12.2*  HCT 35.7* 36.0*  PLT 309  --     Coag's  Recent Labs Lab 07/25/17 1145  APTT 32  INR 1.03    Sepsis Markers  Recent Labs Lab 07/25/17 1209  LATICACIDVEN 3.76*    ABG No results for input(s): PHART, PCO2ART, PO2ART in the last 168 hours.  Liver Enzymes No results for input(s): AST, ALT, ALKPHOS, BILITOT, ALBUMIN in the last 168 hours.  Cardiac Enzymes No results for input(s): TROPONINI, PROBNP in the last 168 hours.  Glucose No results for input(s): GLUCAP in the last 168 hours.  Imaging No results found.   STUDIES:  CXR 9/18 >   CULTURES: Blood 9/18 >  Sputum 9/18 >   ANTIBIOTICS: None.  SIGNIFICANT EVENTS: 9/18 > admit, taken to  Cath Lab for emergent cath.  LINES/TUBES: ETT 9/18 >   DISCUSSION: 71 y.o. male admitted 9/18 after out-of-hospital cardiac arrest. He suffered recurrent arrest after arrival to ED with profound bradycardia so taken to cath lab for temp pacer placement.  He was intubated for airway protection prior. After arrival back in ICU, he is awake and following commands.  Undergoing PSV now and hoping for extubation this PM.  ASSESSMENT / PLAN:  PULMONARY A: Respiratory insufficiency - status post intubation due to inability to protect airway. Hx sleep apnea. P:   SBT now with plans to extubate. VAP prevention measures. CXR in  AM.  CARDIOVASCULAR A:  Cardiac arrest - unclear etiology at this point. Profound bradycardia - s/p temp pacer placement. Hx HLD, HOCM. P:  No hypothermia protocol given good mental status. Echo per cards. Trend lactate, troponin. Cardiology following. Continue preadmission aspirin, pravastatin. Hold preadmission Toprol XL.  RENAL A:   Hyponatremia. Hypokalemia. AKI. Hypocalcemia. P:   Repeat BMP this PM. 1g Ca gluconate. Correct electrolytes as indicated.  GASTROINTESTINAL A:   Nutrition. P:   NPO.  HEMATOLOGIC A:   VTE Prophylaxis. P:  SCD's / heparin CBC in AM.  INFECTIOUS A:   No indication of infection. P:   Follow cultures as above. Monitor clinically. Assess PCT - if high start empiric abx.  ENDOCRINE A:     No acute issues. P:   No interventions required.  NEUROLOGIC A:   No acute issues. P:   Hold sedation now pending extubation. Continue preadmission lamotrigine. Hold preadmission bupropion, bupropion, venlafaxine.  Family updated: None available.  Interdisciplinary Family Meeting v Palliative Care Meeting:  Due by: 08/02/17.  CC time: 35 min.   Montey Hora, [facility] Pager: [phone]  or [phone] 07/25/2017, 1:00 PM  Attending Note:  71 year male s/p cardiac arrest who was intubated and CPR started with ROSC.  Patient then developed complete heart block and another arrest.  ROSC again established.  Patient recently had an ablation evidently and now will need a pacer.  Patient was taken to the cath lab and pacer was placed.  Patient is now alert and interactive, moving all ext to command.  Hemodynamically stable.  Will proceed with weaning trials, no need for CT or hypothermia protocol.  Hope is to extubate today.  The patient is critically ill with multiple organ systems failure and requires high complexity decision making for assessment and support, frequent evaluation and  titration of therapies, application of advanced monitoring technologies and extensive interpretation of multiple databases.   Critical Care Time devoted to patient care services described in this note is  35  Minutes. This time reflects time of care of this signee Dr Jennet Maduro. This critical care time does not reflect procedure time, or teaching time or supervisory time of PA/NP/Med student/Med Resident etc but could involve care discussion time.  Rush Farmer, M.D. [facility] Pulmonary/Critical Care Medicine. Pager: [phone]. After hours pager: [phone].

## 2017-07-25 NOTE — ED Triage Notes (Signed)
Per ems, pt called out for syncopal epside, witnessed fall at a tire store, pt was in a chair and slid out. Carotid pulse initially, lost pulses, did 1 cycle cpr, got pulses back, did lucas compressions x1 min, pt was narrow coplex HR, was cardioverted, now in sinus tach 100-120, seizure like activity with vomiting, 2.5 versed on board due to combative. 240 palpated blood pressure.

## 2017-07-25 NOTE — ED Notes (Signed)
20 of etomidate given

## 2017-07-25 NOTE — Procedures (Signed)
Extubation Procedure Note  Patient Details:   Name: Michael Barton DOB: Jul 09, 1946 MRN: [id number]   Airway Documentation:  Airway 7.5 mm (Active)  Secured at (cm) 26 cm 07/25/2017  2:07 PM  Measured From Lips 07/25/2017  2:07 PM  Secured Location Left 07/25/2017  2:07 PM  Secured By [employer] 07/25/2017  2:07 PM  Tube Holder Repositioned Yes 07/25/2017  2:07 PM  Cuff Pressure (cm H2O) 26 cm H2O 07/25/2017 11:50 AM  Site Condition Dry 07/25/2017  2:07 PM   + cuff leak test prior to extubation.    Evaluation  O2 sats: stable throughout Complications: No apparent complications Patient did tolerate procedure well. Bilateral Breath Sounds: Clear   Yes pt able to speak.  No distress noted, no stridor noted. RN and MD at bedside.     Lenna Sciara 07/25/2017, 3:08 PM

## 2017-07-25 NOTE — Consult Note (Signed)
Cardiology Consultation:   Patient ID: Michael Barton; [id number]; [birth year]   Admit date: 07/25/2017 Date of Consult: 07/25/2017  Primary Care Provider: Gaynelle Arabian, MD Primary Cardiologist: Michael Rogers MD Primary Electrophysiologist:  none   Patient Profile:   Michael Barton is a 71 y.o. male with a hx of HOCM who is being seen today for the evaluation of cardiac arrest at the request of Michael Barton.  History of Present Illness:   Michael Barton is has a history of HOCM followed by Michael Barton. He was evaluated in June 2017 with a normal Myoview study and normal CPX. Echo at that time showed LVH with mild dynamic outflow obstruction. In June 2018 he presented with progressive dyspnea and near syncope on beta blocker. He underwent cardiac cath which showed minimal nonobstructive CAD, LVOT gradient of 55 mm Hg with post PVC gradient of 120 mm Hg. Normal right heart pressures. He was referred to [facility] where Echo showed severe LVH with severe asymmetric septal hypertrophy and significant LVOT gradient. He underwent alcohol septal ablation 6 days ago. The procedure was uncomplicated. Post procedure Ecg showed a new RBBB. Post procedure Echo showed akinesis of the basal septum.  The patient was at a tire store today when other customers found him slumped in a chair. He was positioned on the floor and initially conscious but then lost consciousness. EMS called. Initial rhythm was agonal. After CPR patient regained a pulse and BP. He was difficult to arouse. En route to ED EMS noted seizure activity and was given Versed. In ED patient was poorly responsive and intubated. Prior to intubation he coughed and developed complete heart block with no ventricular escape. CPR performed again and given IV epinephrine with return to NSR. Had another episode of asystole on transport to cath lab for temporary pacer.   Past Medical History:  Diagnosis Date  . Depression   . HOCM (hypertrophic  obstructive cardiomyopathy) ([place])   . Hyperlipidemia   . Sleep apnea   . Testicular cancer ([place])     No past surgical history on file.   Home Medications:  Wellbutrin SR 100 mg daily Androgel pump Seroquel 200 mg qhs Effexor 150 mg daily. Metoprolol 100 mg daily Pravastatin 40 mg daily.  Inpatient Medications: Scheduled Meds: . [MAR Hold] heparin  4,000 Units Intravenous Once   Continuous Infusions: . propofol    . propofol 34.5 mL/hr at 07/25/17 1303   PRN Meds: EPINEPHrine, etomidate, lidocaine (PF), midazolam, propofol, succinylcholine  Allergies:   No Known Allergies  Social History:   Social History   Social History  . Marital status: Single    Spouse name: N/A  . Number of children: N/A  . Years of education: N/A   Occupational History  . Not on file.   Social History Main Topics  . Smoking status: Not on file  . Smokeless tobacco: Not on file  . Alcohol use Not on file  . Drug use: Unknown  . Sexual activity: Not on file   Other Topics Concern  . Not on file   Social History Narrative  . No narrative on file    Family History:    Family History  Problem Relation Age of Onset  . Thyroid disease Mother   . Depression Mother   . Cancer Father      ROS:  Please see the history of present illness.  ROS  All other ROS is unobtainable.   Physical Exam/Data:   Vitals:   07/25/17  1150 07/25/17 1200 07/25/17 1203 07/25/17 1204  BP: (!) 183/91 (!) 263/128 (!) 213/94   Pulse: 77 88  (!) 101  Resp: 18 18 18 17   SpO2: 100% 96%  99%  Weight:      Height:       No intake or output data in the 24 hours ending 07/25/17 1322 Filed Weights   07/25/17 1143  Weight: 187 lb 6.3 oz (85 kg)   Body mass index is 24.72 kg/m.  General:  Well nourished, well developed, lethargic and poorly responsive. HEENT: normal Lymph: no adenopathy Neck: no JVD or bruits Endocrine:  No thryomegaly Vascular: No carotid bruits; FA pulses 2+ bilaterally without  bruits  Cardiac:  normal S1, S2; RRR; no murmur anteriorly. Lungs:  clear to auscultation bilaterally, no wheezing, rhonchi or rales  Abd: soft, nontender, no hepatomegaly  Ext: no edema Musculoskeletal:  No deformities, BUE and BLE strength normal and equal Skin: warm and dry  Neuro:  Some spontaneous movement of extremities.  Psych:  Normal affect   EKG:  The EKG was personally reviewed and demonstrates:  NSR with RBBB. ST depression c/w lateral ischemia.  Telemetry:  Telemetry was personally reviewed and demonstrates:  Intermittent complete heart block with ventricular standstill.  Relevant CV Studies: none  Laboratory Data:  Chemistry  Recent Labs Lab 07/25/17 1145 07/25/17 1208  NA 132* 134*  K 3.2* 3.4*  CL 97* 97*  CO2 23  --   GLUCOSE 173* 177*  BUN 14 16  CREATININE 1.60* 1.40*  CALCIUM 8.7*  --   GFRNONAA 42*  --   GFRAA 49*  --   ANIONGAP 12  --      Recent Labs Lab 07/25/17 1145  PROT 6.7  ALBUMIN 3.8  AST 56*  ALT 49  ALKPHOS 60  BILITOT 0.7   Hematology  Recent Labs Lab 07/25/17 1145 07/25/17 1208  WBC 7.2  --   RBC 4.15*  --   HGB 12.5* 12.2*  HCT 35.7* 36.0*  MCV 86.0  --   MCH 30.1  --   MCHC 35.0  --   RDW 13.1  --   PLT 309  --    Cardiac Enzymes  Recent Labs Lab 07/25/17 1145  TROPONINI 3.82*     Recent Labs Lab 07/25/17 1206  TROPIPOC 4.22*    BNPNo results for input(s): BNP, PROBNP in the last 168 hours.  DDimer No results for input(s): DDIMER in the last 168 hours.  Radiology/Studies:  Dg Chest Port 1 View  Result Date: 07/25/2017 CLINICAL DATA:  71 year old male status post cardiopulmonary arrest and resuscitation including cardioversion, intubation EXAM: PORTABLE CHEST 1 VIEW COMPARISON:  Chest radiographs 03/24/2008. FINDINGS: Portable AP supine view at 1206 hours. Endotracheal tube tip in good position just below the level the clavicles. Enteric tube courses to the left upper quadrant, tip not included.  Mildly low lung volumes. Mediastinal contours appear stable when allowing for portable technique. Mild streaky left perihilar and retrocardiac opacity. No pneumothorax, pulmonary edema, or definite effusion. No confluent right lung opacity. No displaced rib fracture or No acute osseous abnormality identified. IMPRESSION: 1. Endotracheal tube tip in good position. Enteric tube courses to the abdomen, tip not included. 2. Streaky left perihilar and lung base opacity is nonspecific, but favor atelectasis. 3. No other acute cardiopulmonary abnormality identified. Electronically Signed   By: Genevie Ann M.D.   On: 07/25/2017 12:51    Assessment and Plan:   1. Syncope/ cardiac arrest secondary  to complete heart block and agonal rhythm. Recent alcohol septal ablation 6 days ago with new RBBB post procedure. Will proceed with emergent temporary transvenous pacemaker implant. Hold beta blocker. Vent support per CCM. Check electrolytes and serial troponins. Will need EP to see to consider PPM. 2. HOCM s/p alcohol septal ablation 6 days prior. Repeat Echo. 3. Depression 4. Hyperlipidemia   For questions or updates, please contact [facility] Please consult [email or web address] for contact info under Cardiology/STEMI.   Signed, Nakai Pollio Martinique, MD  07/25/2017 1:22 PM

## 2017-07-25 NOTE — ED Notes (Signed)
CPR started, moved back into Tra C, given 1 of epi

## 2017-07-25 NOTE — ED Triage Notes (Signed)
Cardiology at bedside.

## 2017-07-25 NOTE — ED Notes (Signed)
100 of succ given

## 2017-07-26 ENCOUNTER — Encounter (HOSPITAL_COMMUNITY): Payer: Self-pay | Admitting: Internal Medicine

## 2017-07-26 ENCOUNTER — Encounter (HOSPITAL_COMMUNITY)
Admission: EM | Disposition: A | Discharge status: Home / Self Care | Payer: Self-pay | Source: Home / Self Care | Attending: Cardiology

## 2017-07-26 HISTORY — PX: PACEMAKER IMPLANT: EP1218

## 2017-07-26 LAB — BASIC METABOLIC PANEL
ANION GAP: 6 (ref 5–15)
BUN: 10 mg/dL (ref 6–20)
CO2: 28 mmol/L (ref 22–32)
Calcium: 8.1 mg/dL — ABNORMAL LOW (ref 8.9–10.3)
Chloride: 98 mmol/L — ABNORMAL LOW (ref 101–111)
Creatinine, Ser: 1.23 mg/dL (ref 0.61–1.24)
GFR calc Af Amer: >60 mL/min (ref >60)
GFR, EST NON AFRICAN AMERICAN: 58 mL/min — AB (ref >60)
Glucose, Bld: 124 mg/dL — ABNORMAL HIGH (ref 65–99)
POTASSIUM: 3.3 mmol/L — AB (ref 3.5–5.1)
SODIUM: 132 mmol/L — AB (ref 135–145)

## 2017-07-26 LAB — CBC
HCT: 32.4 % — ABNORMAL LOW (ref 39.0–52.0)
Hemoglobin: 11.2 g/dL — ABNORMAL LOW (ref 13.0–17.0)
MCH: 29.3 pg (ref 26.0–34.0)
MCHC: 34.6 g/dL (ref 30.0–36.0)
MCV: 84.8 fL (ref 78.0–100.0)
PLATELETS: 218 10*3/uL (ref 150–400)
RBC: 3.82 MIL/uL — AB (ref 4.22–5.81)
RDW: 12.9 % (ref 11.5–15.5)
WBC: 5.9 10*3/uL (ref 4.0–10.5)

## 2017-07-26 LAB — PHOSPHORUS: Phosphorus: 2.5 mg/dL (ref 2.5–4.6)

## 2017-07-26 LAB — TROPONIN I: Troponin I: 2.5 ng/mL (ref <0.03)

## 2017-07-26 LAB — MAGNESIUM: MAGNESIUM: 2.2 mg/dL (ref 1.7–2.4)

## 2017-07-26 SURGERY — PACEMAKER IMPLANT

## 2017-07-26 MED ORDER — MIDAZOLAM HCL 5 MG/5ML IJ SOLN
INTRAMUSCULAR | Status: AC
  Filled 2017-07-26: qty 5

## 2017-07-26 MED ORDER — FENTANYL CITRATE (PF) 100 MCG/2ML IJ SOLN
INTRAMUSCULAR | Status: AC
  Filled 2017-07-26: qty 2

## 2017-07-26 MED ORDER — FENTANYL CITRATE (PF) 100 MCG/2ML IJ SOLN
INTRAMUSCULAR | Status: DC | PRN
  Administered 2017-07-26: 25 ug via INTRAVENOUS
  Administered 2017-07-26: 12.5 ug via INTRAVENOUS

## 2017-07-26 MED ORDER — CEFAZOLIN SODIUM-DEXTROSE 1-4 GM/50ML-% IV SOLN
1.0000 g | Freq: Four times a day (QID) | INTRAVENOUS | Status: AC
  Administered 2017-07-26 – 2017-07-27 (×3): 1 g via INTRAVENOUS
  Filled 2017-07-26 (×5): qty 50

## 2017-07-26 MED ORDER — CEFAZOLIN SODIUM-DEXTROSE 2-4 GM/100ML-% IV SOLN
INTRAVENOUS | Status: AC
  Filled 2017-07-26: qty 100

## 2017-07-26 MED ORDER — LIDOCAINE HCL (PF) 1 % IJ SOLN
INTRAMUSCULAR | Status: AC
  Filled 2017-07-26: qty 30

## 2017-07-26 MED ORDER — GENTAMICIN SULFATE 40 MG/ML IJ SOLN
INTRAMUSCULAR | Status: AC
  Filled 2017-07-26: qty 2

## 2017-07-26 MED ORDER — HEPARIN (PORCINE) IN NACL 2-0.9 UNIT/ML-% IJ SOLN
INTRAMUSCULAR | Status: AC
  Filled 2017-07-26: qty 500

## 2017-07-26 MED ORDER — MIDAZOLAM HCL 5 MG/5ML IJ SOLN
INTRAMUSCULAR | Status: DC | PRN
  Administered 2017-07-26 (×3): 1 mg via INTRAVENOUS

## 2017-07-26 MED ORDER — HEPARIN (PORCINE) IN NACL 2-0.9 UNIT/ML-% IJ SOLN
INTRAMUSCULAR | Status: AC | PRN
  Administered 2017-07-26: 500 mL

## 2017-07-26 MED ORDER — POTASSIUM CHLORIDE CRYS ER 20 MEQ PO TBCR
40.0000 meq | EXTENDED_RELEASE_TABLET | Freq: Once | ORAL | Status: AC
  Administered 2017-07-26: 40 meq via ORAL
  Filled 2017-07-26: qty 2

## 2017-07-26 MED ORDER — LIDOCAINE HCL (PF) 1 % IJ SOLN
INTRAMUSCULAR | Status: DC | PRN
  Administered 2017-07-26: 45 mL

## 2017-07-26 MED ORDER — ONDANSETRON HCL 4 MG/2ML IJ SOLN: 4.0000 mg | Freq: Four times a day (QID) | INTRAMUSCULAR | Status: DC | PRN

## 2017-07-26 MED ORDER — ACETAMINOPHEN 325 MG PO TABS
325.0000 mg | ORAL_TABLET | ORAL | Status: DC | PRN
  Administered 2017-07-26: 650 mg via ORAL
  Filled 2017-07-26: qty 2

## 2017-07-26 MED FILL — Medication: Qty: 1 | Status: AC

## 2017-07-26 SURGICAL SUPPLY — 8 items
CABLE SURGICAL S-101-97-12 (CABLE) ×3 IMPLANT
IPG PACE AZUR XT DR MRI W1DR01 (Pacemaker) ×1 IMPLANT
LEAD CAPSURE NOVUS 5076-52CM (Lead) ×3 IMPLANT
LEAD CAPSURE NOVUS 5076-58CM (Lead) ×3 IMPLANT
PACE AZURE XT DR MRI W1DR01 (Pacemaker) ×3 IMPLANT
PAD DEFIB LIFELINK (PAD) ×3 IMPLANT
SHEATH CLASSIC 7F (SHEATH) ×6 IMPLANT
TRAY PACEMAKER INSERTION (PACKS) ×3 IMPLANT

## 2017-07-26 NOTE — Progress Notes (Signed)
Electrophysiology Rounding Note  Patient Name: Michael Barton Date of Encounter: 07/26/2017  Primary Cardiologist: Nahser Electrophysiologist: Lovena Le (new this admission)   Subjective   The patient is doing well today.  At this time, the patient denies chest pain, shortness of breath, or any new concerns.  Inpatient Medications    Scheduled Meds: . aspirin  81 mg Oral Daily  . buPROPion  100 mg Oral BID  . gentamicin irrigation  80 mg Irrigation To Cath  . Influenza vac split quadrivalent PF  0.5 mL Intramuscular Tomorrow-1000  . lamoTRIgine  200 mg Oral Daily  . pravastatin  40 mg Oral q1800  . QUEtiapine  200 mg Oral QHS   Continuous Infusions: . sodium chloride 50 mL/hr at 07/26/17 0700  .  ceFAZolin (ANCEF) IV     PRN Meds: EPINEPHrine, etomidate, succinylcholine   Vital Signs    Vitals:   07/26/17 0414 07/26/17 0500 07/26/17 0600 07/26/17 0700  BP:  121/64 109/62 118/67  Pulse:  75 69 70  Resp:  (!) 27 19 14   Temp: 99.6 F (37.6 C)     TempSrc: Oral     SpO2:  98% 100% 99%  Weight:      Height:        Intake/Output Summary (Last 24 hours) at 07/26/17 0730 Last data filed at 07/26/17 0700  Gross per 24 hour  Intake              820 ml  Output             1250 ml  Net             -430 ml   Filed Weights   07/25/17 1143 07/25/17 1600  Weight: 187 lb 6.3 oz (85 kg) 217 lb 13 oz (98.8 kg)    Physical Exam    GEN- The patient is well appearing, alert and oriented x 3 today.   Head- normocephalic, atraumatic Eyes-  Sclera clear, conjunctiva pink Ears- hearing intact Oropharynx- clear Neck- supple Lungs- Clear to ausculation bilaterally, normal work of breathing Heart- Regular rate and rhythm, no murmurs, rubs or gallops GI- soft, NT, ND, + BS Extremities- no clubbing, cyanosis, or edema Skin- no rash or lesion Psych- euthymic mood, full affect Neuro- strength and sensation are intact  Labs    CBC  Recent Labs  07/25/17 1145  07/25/17 1208 07/26/17 0330  WBC 7.2  --  5.9  NEUTROABS 3.1  --   --   HGB 12.5* 12.2* 11.2*  HCT 35.7* 36.0* 32.4*  MCV 86.0  --  84.8  PLT 309  --  941   Basic Metabolic Panel  Recent Labs  07/25/17 1553 07/26/17 0330  NA 131* 132*  K 4.2 3.3*  CL 98* 98*  CO2 23 28  GLUCOSE 120* 124*  BUN 15 10  CREATININE 1.35* 1.23  CALCIUM 8.3* 8.1*  MG  --  2.2  PHOS  --  2.5   Liver Function Tests  Recent Labs  07/25/17 1145  AST 56*  ALT 49  ALKPHOS 60  BILITOT 0.7  PROT 6.7  ALBUMIN 3.8   Cardiac Enzymes  Recent Labs  07/25/17 1145 07/25/17 1553 07/26/17 0330  TROPONINI 3.82* 3.19* 2.50*   Fasting Lipid Panel  Recent Labs  07/25/17 1145  CHOL 157  HDL 53  LDLCALC 87  TRIG 85  CHOLHDL 3.0    Telemetry    Sinus rhythm, intermittent V pacing  (personally reviewed)  Radiology    Dg Chest Port 1 View  Result Date: 07/25/2017 CLINICAL DATA:  71 year old male status post cardiopulmonary arrest and resuscitation including cardioversion, intubation EXAM: PORTABLE CHEST 1 VIEW COMPARISON:  Chest radiographs 03/24/2008. FINDINGS: Portable AP supine view at 1206 hours. Endotracheal tube tip in good position just below the level the clavicles. Enteric tube courses to the left upper quadrant, tip not included. Mildly low lung volumes. Mediastinal contours appear stable when allowing for portable technique. Mild streaky left perihilar and retrocardiac opacity. No pneumothorax, pulmonary edema, or definite effusion. No confluent right lung opacity. No displaced rib fracture or No acute osseous abnormality identified. IMPRESSION: 1. Endotracheal tube tip in good position. Enteric tube courses to the abdomen, tip not included. 2. Streaky left perihilar and lung base opacity is nonspecific, but favor atelectasis. 3. No other acute cardiopulmonary abnormality identified. Electronically Signed   By: Michael Barton M.D.   On: 07/25/2017 12:51     Patient Profile     Michael Barton is a 71 y.o. male s/p alcohol septal ablation 7 days ago at [facility]. He had syncope and cardiac arrest with intermittent complete heart block and underwent urgent temp pacemaker placement 07/25/17.  Assessment & Plan    1.  Intermittent complete heart block The patient presents with symptomatic intermittent complete heart block 6 days after alcohol septal ablation at [facility]. Post procedure there, he had a new RBBB but did not have any AV block and did not require pacing. He has been on Metoprolol at home. He is currently stable with temp pacing wire in place.  Dr Rayann Heman called [facility] and spoke with Dr Mina Marble. Dr Mina Marble feels that he needs BB long term for management of HOCM.  He did not feel like he had risk factors that required ICD implant at this time.  Will plan for pacemaker implant today with Dr Lovena Le.   2.  HOCM S/p alcohol septal ablation Managed at [facility], Chanetta Marshall, NP  07/26/2017, 7:30 AM   EP Attending  Patient seen and examined. Agree with the findings as noted above. The patient has developed symptomatic complete AV block 6 days after alcohol septal ablation and presents for PPM. He was felt that long term beta blocker therapy was important because of his hypertrophic CM. Therefore he has no reversible causes to treat his heart block. He will undergo PPM with a DDD PM today. I have reviewed the risks/benefits/goals/expectations of the procedure with the patient and he wishes to proceed.  Michael Barton.D.

## 2017-07-26 NOTE — H&P (View-Only) (Signed)
Electrophysiology Rounding Note  Patient Name: Michael Barton Date of Encounter: 07/26/2017  Primary Cardiologist: Nahser Electrophysiologist: Lovena Le (new this admission)   Subjective   The patient is doing well today.  At this time, the patient denies chest pain, shortness of breath, or any new concerns.  Inpatient Medications    Scheduled Meds: . aspirin  81 mg Oral Daily  . buPROPion  100 mg Oral BID  . gentamicin irrigation  80 mg Irrigation To Cath  . Influenza vac split quadrivalent PF  0.5 mL Intramuscular Tomorrow-1000  . lamoTRIgine  200 mg Oral Daily  . pravastatin  40 mg Oral q1800  . QUEtiapine  200 mg Oral QHS   Continuous Infusions: . sodium chloride 50 mL/hr at 07/26/17 0700  .  ceFAZolin (ANCEF) IV     PRN Meds: EPINEPHrine, etomidate, succinylcholine   Vital Signs    Vitals:   07/26/17 0414 07/26/17 0500 07/26/17 0600 07/26/17 0700  BP:  121/64 109/62 118/67  Pulse:  75 69 70  Resp:  (!) 27 19 14   Temp: 99.6 F (37.6 C)     TempSrc: Oral     SpO2:  98% 100% 99%  Weight:      Height:        Intake/Output Summary (Last 24 hours) at 07/26/17 0730 Last data filed at 07/26/17 0700  Gross per 24 hour  Intake              820 ml  Output             1250 ml  Net             -430 ml   Filed Weights   07/25/17 1143 07/25/17 1600  Weight: 187 lb 6.3 oz (85 kg) 217 lb 13 oz (98.8 kg)    Physical Exam    GEN- The patient is well appearing, alert and oriented x 3 today.   Head- normocephalic, atraumatic Eyes-  Sclera clear, conjunctiva pink Ears- hearing intact Oropharynx- clear Neck- supple Lungs- Clear to ausculation bilaterally, normal work of breathing Heart- Regular rate and rhythm, no murmurs, rubs or gallops GI- soft, NT, ND, + BS Extremities- no clubbing, cyanosis, or edema Skin- no rash or lesion Psych- euthymic mood, full affect Neuro- strength and sensation are intact  Labs    CBC  Recent Labs  07/25/17 1145  07/25/17 1208 07/26/17 0330  WBC 7.2  --  5.9  NEUTROABS 3.1  --   --   HGB 12.5* 12.2* 11.2*  HCT 35.7* 36.0* 32.4*  MCV 86.0  --  84.8  PLT 309  --  956   Basic Metabolic Panel  Recent Labs  07/25/17 1553 07/26/17 0330  NA 131* 132*  K 4.2 3.3*  CL 98* 98*  CO2 23 28  GLUCOSE 120* 124*  BUN 15 10  CREATININE 1.35* 1.23  CALCIUM 8.3* 8.1*  MG  --  2.2  PHOS  --  2.5   Liver Function Tests  Recent Labs  07/25/17 1145  AST 56*  ALT 49  ALKPHOS 60  BILITOT 0.7  PROT 6.7  ALBUMIN 3.8   Cardiac Enzymes  Recent Labs  07/25/17 1145 07/25/17 1553 07/26/17 0330  TROPONINI 3.82* 3.19* 2.50*   Fasting Lipid Panel  Recent Labs  07/25/17 1145  CHOL 157  HDL 53  LDLCALC 87  TRIG 85  CHOLHDL 3.0    Telemetry    Sinus rhythm, intermittent V pacing  (personally reviewed)  Radiology    Dg Chest Port 1 View  Result Date: 07/25/2017 CLINICAL DATA:  71 year old male status post cardiopulmonary arrest and resuscitation including cardioversion, intubation EXAM: PORTABLE CHEST 1 VIEW COMPARISON:  Chest radiographs 03/24/2008. FINDINGS: Portable AP supine view at 1206 hours. Endotracheal tube tip in good position just below the level the clavicles. Enteric tube courses to the left upper quadrant, tip not included. Mildly low lung volumes. Mediastinal contours appear stable when allowing for portable technique. Mild streaky left perihilar and retrocardiac opacity. No pneumothorax, pulmonary edema, or definite effusion. No confluent right lung opacity. No displaced rib fracture or No acute osseous abnormality identified. IMPRESSION: 1. Endotracheal tube tip in good position. Enteric tube courses to the abdomen, tip not included. 2. Streaky left perihilar and lung base opacity is nonspecific, but favor atelectasis. 3. No other acute cardiopulmonary abnormality identified. Electronically Signed   By: Genevie Ann M.D.   On: 07/25/2017 12:51     Patient Profile     Michael Barton is a 71 y.o. male s/p alcohol septal ablation 7 days ago at [facility]. He had syncope and cardiac arrest with intermittent complete heart block and underwent urgent temp pacemaker placement 07/25/17.  Assessment & Plan    1.  Intermittent complete heart block The patient presents with symptomatic intermittent complete heart block 6 days after alcohol septal ablation at [facility]. Post procedure there, he had a new RBBB but did not have any AV block and did not require pacing. He has been on Metoprolol at home. He is currently stable with temp pacing wire in place.  Dr Rayann Heman called [facility] and spoke with Dr Mina Marble. Dr Mina Marble feels that he needs BB long term for management of HOCM.  He did not feel like he had risk factors that required ICD implant at this time.  Will plan for pacemaker implant today with Dr Lovena Le.   2.  HOCM S/p alcohol septal ablation Managed at [facility], Chanetta Marshall, NP  07/26/2017, 7:30 AM   EP Attending  Patient seen and examined. Agree with the findings as noted above. The patient has developed symptomatic complete AV block 6 days after alcohol septal ablation and presents for PPM. He was felt that long term beta blocker therapy was important because of his hypertrophic CM. Therefore he has no reversible causes to treat his heart block. He will undergo PPM with a DDD PM today. I have reviewed the risks/benefits/goals/expectations of the procedure with the patient and he wishes to proceed.  [name]

## 2017-07-26 NOTE — Discharge Instructions (Signed)
° ° °  Supplemental Discharge Instructions for  Pacemaker/Defibrillator Patients  Activity No heavy lifting or vigorous activity with your left/right arm for 6 to 8 weeks.  Do not raise your left/right arm above your head for one week.  Gradually raise your affected arm as drawn below.           __     07/30/17                       07/31/17                     08/01/17                  08/02/17  NO DRIVING for  1 week   ; you may begin driving on   0/93/26  .  WOUND CARE - Keep the wound area clean and dry.  Do not get this area wet for one week. No showers for one week; you may shower on  08/02/17   . - The tape/steri-strips on your wound will fall off; do not pull them off.  No bandage is needed on the site.  DO  NOT apply any creams, oils, or ointments to the wound area. - If you notice any drainage or discharge from the wound, any swelling or bruising at the site, or you develop a fever > 101? F after you are discharged home, call the office at once.  Special Instructions - You are still able to use cellular telephones; use the ear opposite the side where you have your pacemaker/defibrillator.  Avoid carrying your cellular phone near your device. - When traveling through airports, show security personnel your identification card to avoid being screened in the metal detectors.  Ask the security personnel to use the hand wand. - Avoid arc welding equipment, TENS units (transcutaneous nerve stimulators).  Call the office for questions about other devices. - Avoid electrical appliances that are in poor condition or are not properly grounded. - Microwave ovens are safe to be near or to operate.

## 2017-07-26 NOTE — Interval H&P Note (Signed)
History and Physical Interval Note:  07/26/2017 9:37 AM  Michael Barton  has presented today for surgery, with the diagnosis of hb  The various methods of treatment have been discussed with the patient and family. After consideration of risks, benefits and other options for treatment, the patient has consented to  Procedure(s): Pacemaker Implant (N/A) as a surgical intervention .  The patient's history has been reviewed, patient examined, no change in status, stable for surgery.  I have reviewed the patient's chart and labs.  Questions were answered to the patient's satisfaction.     Cristopher Peru

## 2017-07-26 NOTE — Discharge Summary (Signed)
ELECTROPHYSIOLOGY PROCEDURE DISCHARGE SUMMARY    Patient ID: Michael Barton,  MRN: [id number], DOB/AGE: Feb 14, 1946 71 y.o.  Admit date: 07/25/2017 Discharge date: 07/27/2017  Primary Care Physician: Gaynelle Arabian, MD Primary Cardiologist: Nahser Electrophysiologist: Lovena Le  Primary Discharge Diagnosis:  Symptomatic intermittent complete heart block status post pacemaker implantation this admission  Secondary Discharge Diagnosis:  1.  HOCM 2.  OSA  Allergies  Allergen Reactions  . Ambien [Zolpidem Tartrate] Other (See Comments)    GERD  . Prozac [Fluoxetine] Other (See Comments)    "makes me feel weird"     Procedures This Admission:  1.  Implantation of a MDT dual chamber PPM on 07/26/17 by Dr Lovena Le.  The patient received a MDT model number Azure PPM with model number 5076 right atrial lead and 5076 right ventricular lead. There were no immediate post procedure complications. 2.  CXR on 07/27/17 demonstrated no pneumothorax status post device implantation.   Brief HPI/Hospital Course: Michael Barton is a 71 y.o. male who underwent alcohol septal ablation last week. Post procedure, he had a new RBBB and was discharged on BB. 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 EMS was called and he was initially in an agonal rhythm.  CPR was initiated and he regained pulse and blood pressure. He was transported to [facility] where he had recurrent intermittent complete heart block and was taken urgently for temp-pacemaker placement. Discussions were had with Dr Mina Marble at [facility] who felt that he needed BB therapy long term for HOCM. Risks, benefits, and alternatives to PPM implantation were reviewed with the patient who wished to proceed.  The patient underwent implantation of a MDT dual chamber PPM with details as outlined above.  He  was monitored on telemetry overnight which demonstrated sinus rhythm.  Left chest was without hematoma or  ecchymosis.  The device was interrogated and found to be functioning normally.  CXR was obtained and demonstrated no pneumothorax status post device implantation.  Wound care, arm mobility, and restrictions were reviewed with the patient.  The patient was examined and considered stable for discharge to home.    Physical Exam: Vitals:   07/26/17 2000 07/26/17 2108 07/27/17 0002 07/27/17 0413  BP: (!) 156/81 139/76 (!) 149/73 131/69  Pulse: 85 77 72 91  Resp: 14 15 16 19   Temp: 98.7 F (37.1 C) 98.7 F (37.1 C) (!) 97.3 F (36.3 C) (!) 97.5 F (36.4 C)  TempSrc: Axillary Oral Oral Oral  SpO2: 96% 97% 98% 99%  Weight:    209 lb (94.8 kg)  Height:        GEN- The patient is well appearing, alert and oriented x 3 today.   HEENT: normocephalic, atraumatic; sclera clear, conjunctiva pink; hearing intact; oropharynx clear; neck supple  Lungs- Clear to ausculation bilaterally, normal work of breathing.  No wheezes, rales, rhonchi Heart- Regular rate and rhythm  GI- soft, non-tender, non-distended, bowel sounds present   Extremities- no clubbing, cyanosis, or edema  MS- no significant deformity or atrophy Skin- warm and dry, no rash or lesion, left chest without hematoma/ecchymosis Psych- euthymic mood, full affect Neuro- strength and sensation are intact   Labs:   Lab Results  Component Value Date   WBC 5.9 07/26/2017   HGB 11.2 (L) 07/26/2017   HCT 32.4 (L) 07/26/2017   MCV 84.8 07/26/2017   PLT 218 07/26/2017    Recent Labs Lab 07/25/17 1145  07/26/17 0330  NA 132*  < > 132*  K 3.2*  < > 3.3*  CL 97*  < > 98*  CO2 23  < > 28  BUN 14  < > 10  CREATININE 1.60*  < > 1.23  CALCIUM 8.7*  < > 8.1*  PROT 6.7  --   --   BILITOT 0.7  --   --   ALKPHOS 60  --   --   ALT 49  --   --   AST 56*  --   --   GLUCOSE 173*  < > 124*  < > = values in this interval not displayed.  Discharge Medications:  Allergies as of 07/27/2017      Reactions   Ambien [zolpidem Tartrate] Other  (See Comments)   GERD   Prozac [fluoxetine] Other (See Comments)   "makes me feel weird"      Medication List    TAKE these medications   aspirin EC 81 MG tablet Take 81 mg by mouth daily.   buPROPion 100 MG 12 hr tablet Commonly known as:  WELLBUTRIN SR Take 100 mg by mouth 2 (two) times daily.   lamoTRIgine 200 MG tablet Commonly known as:  LAMICTAL Take 200 mg by mouth daily.   metoprolol succinate 100 MG 24 hr tablet Commonly known as:  TOPROL-XL Take 100 mg by mouth daily. Take with or immediately following a meal.   pravastatin 40 MG tablet Commonly known as:  PRAVACHOL Take 40 mg by mouth daily.   QUEtiapine 200 MG tablet Commonly known as:  SEROQUEL Take 200 mg by mouth at bedtime.   testosterone 50 MG/5GM (1%) Gel Commonly known as:  ANDROGEL Place 2.5 g onto the skin See admin instructions. Apply one pump (1.25 gram) to each shoulder once daily.   venlafaxine XR 150 MG 24 hr capsule Commonly known as:  EFFEXOR-XR Take 150 mg by mouth daily with breakfast.            Discharge Care Instructions        Start     Ordered   07/27/17 0000  Increase activity slowly     07/27/17 0711   07/27/17 0000  Diet - low sodium heart healthy     07/27/17 6433      Disposition:  Discharge Instructions    Diet - low sodium heart healthy    Complete by:  As directed    Increase activity slowly    Complete by:  As directed      Follow-up Information    [facility] Follow up on 08/03/2017.   Specialty:  Cardiology Why:  at 4:30PM  Contact information: [address], [address]       Evans Lance, MD Follow up on 10/27/2017.   Specialty:  Cardiology Why:  at 9:45AM Contact information: [address] [address] [phone]           Duration of Discharge Encounter: Greater than 30 minutes including physician time.  Signed, Chanetta Marshall,  NP 07/27/2017 7:11 AM   EP attending  Patient seen and examined. Agree with the findings as noted above. His Medtronic dual-chamber pacemaker is working normally. He has been interrogated under my direction. Chest x-ray is good with no pneumothorax or enlarging effusion. He will be discharged home with usual follow-up.  Cristopher Peru, M.D.

## 2017-07-26 NOTE — Care Management Note (Signed)
Case Management Note Marvetta Gibbons RN, BSN Unit 4E-Case Manager-- [place] coverage [phone]  Patient Details  Name: Michael Barton MRN: [id number] Date of Birth: October 15, 1946  Subjective/Objective: Pt admitted with [facility]- for PPM today                  Action/Plan: PTA pt lived at home- anticipate return home- CM to follow for d/c needs.   Expected Discharge Date:                  Expected Discharge Plan:  Home/Self Care  In-House Referral:     Discharge planning Services  CM Consult  Post Acute Care Choice:    Choice offered to:     DME Arranged:    DME Agency:     HH Arranged:    HH Agency:     Status of Service:  In process, will continue to follow  If discussed at Long Length of Stay Meetings, dates discussed:    Discharge Disposition:   Additional Comments:  Dawayne Patricia, RN 07/26/2017, 11:34 AM

## 2017-07-26 NOTE — Progress Notes (Signed)
PULMONARY / CRITICAL CARE MEDICINE   Name: Michael Barton MRN: [id number] DOB: 1945-11-21    ADMISSION DATE:  07/25/2017 CONSULTATION DATE:  07/25/17  REFERRING MD:  Vanita Panda  CHIEF COMPLAINT:  Cardiac Arrest  HISTORY OF PRESENT ILLNESS:  Pt is encephelopathic; therefore, this HPI is obtained from chart review. Michael Barton is a 71 y.o. male with PMH as outlined below. EMS was called after a witnessed fall while patient was at a tire store. She was apparently sitting in a chair when she fell out of it. She was pulseless on the scene and received one round of CPR with return of pulses; however, patient was pulseless again and required cardioversion for narrow complex tachycardia and additional CPR.  Upon arrival to ED, he had recurrent arrest and had profound bradycardia. He was subsequently intubated due to inability to protect airway and was taken to the cath lab emergently by cardiology where he had temp pacer placed.   SUBJECTIVE: Extubated after arrival to ICU, tolerated well. No complaints this AM.  VITAL SIGNS: BP 118/67   Pulse 70   Temp 99.6 F (37.6 C) (Oral)   Resp 14   Ht 6\' 3"  (1.905 m)   Wt 98.8 kg (217 lb 13 oz)   SpO2 99%   BMI 27.22 kg/m   HEMODYNAMICS:    VENTILATOR SETTINGS: Vent Mode: CPAP;PSV FiO2 (%):  [40 %-100 %] 40 % Set Rate:  [18 bmp] 18 bmp Vt Set:  [580 mL-640 mL] 640 mL PEEP:  [5 cmH20] 5 cmH20 Pressure Support:  [5 cmH20] 5 cmH20 Plateau Pressure:  [16 cmH20] 16 cmH20  INTAKE / OUTPUT: I/O last 3 completed shifts: In: 75 [P.O.:480; I.V.:170; NG/GT:60; IV Piggyback:110] Out: 1250 [Urine:1250]   PHYSICAL EXAMINATION:  General: Adult male, in NAD. Neuro: A&O x 3, no deficits. HEENT: [place]/AT. Pupils blown, sclerae anicteric. Cardiovascular: RRR, no M/R/G. Lungs: Respirations even and unlabored.  CTAB. Abdomen: BS x 4, soft, NT/ND.  Musculoskeletal: No gross deformities, no edema.  Skin: Intact, warm, no  rashes.  LABS:  BMET  Recent Labs Lab 07/25/17 1145 07/25/17 1208 07/25/17 1553 07/26/17 0330  NA 132* 134* 131* 132*  K 3.2* 3.4* 4.2 3.3*  CL 97* 97* 98* 98*  CO2 23  --  23 28  BUN 14 16 15 10   CREATININE 1.60* 1.40* 1.35* 1.23  GLUCOSE 173* 177* 120* 124*    Electrolytes  Recent Labs Lab 07/25/17 1145 07/25/17 1553 07/26/17 0330  CALCIUM 8.7* 8.3* 8.1*  MG  --   --  2.2  PHOS  --   --  2.5    CBC  Recent Labs Lab 07/25/17 1145 07/25/17 1208 07/26/17 0330  WBC 7.2  --  5.9  HGB 12.5* 12.2* 11.2*  HCT 35.7* 36.0* 32.4*  PLT 309  --  218    Coag's  Recent Labs Lab 07/25/17 1145  APTT 32  INR 1.03    Sepsis Markers  Recent Labs Lab 07/25/17 1209 07/25/17 1515 07/25/17 1553 07/25/17 2228  LATICACIDVEN 3.76* 1.1  --  0.7  PROCALCITON  --   --  <0.10  --     ABG No results for input(s): PHART, PCO2ART, PO2ART in the last 168 hours.  Liver Enzymes  Recent Labs Lab 07/25/17 1145  AST 56*  ALT 49  ALKPHOS 60  BILITOT 0.7  ALBUMIN 3.8    Cardiac Enzymes  Recent Labs Lab 07/25/17 1145 07/25/17 1553 07/26/17 0330  TROPONINI 3.82* 3.19* 2.50*  Glucose No results for input(s): GLUCAP in the last 168 hours.  Imaging Dg Chest Port 1 View  Result Date: 07/25/2017 CLINICAL DATA:  71 year old male status post cardiopulmonary arrest and resuscitation including cardioversion, intubation EXAM: PORTABLE CHEST 1 VIEW COMPARISON:  Chest radiographs 03/24/2008. FINDINGS: Portable AP supine view at 1206 hours. Endotracheal tube tip in good position just below the level the clavicles. Enteric tube courses to the left upper quadrant, tip not included. Mildly low lung volumes. Mediastinal contours appear stable when allowing for portable technique. Mild streaky left perihilar and retrocardiac opacity. No pneumothorax, pulmonary edema, or definite effusion. No confluent right lung opacity. No displaced rib fracture or No acute osseous  abnormality identified. IMPRESSION: 1. Endotracheal tube tip in good position. Enteric tube courses to the abdomen, tip not included. 2. Streaky left perihilar and lung base opacity is nonspecific, but favor atelectasis. 3. No other acute cardiopulmonary abnormality identified. Electronically Signed   By: Genevie Ann M.D.   On: 07/25/2017 12:51     STUDIES:  CXR 9/18 > left basilar atx. Echo 9/18 > EF 65-70%, mild to mod MR, mildly dilated LA and RA.  CULTURES: Blood 9/18 >  Sputum 9/18 >   ANTIBIOTICS: None.  SIGNIFICANT EVENTS: 9/18 > admit, taken to Cath Lab for emergent cath.  LINES/TUBES: ETT 9/18 >   DISCUSSION: 71 y.o. male admitted 9/18 after out-of-hospital cardiac arrest. He suffered recurrent arrest after arrival to ED with profound bradycardia so taken to cath lab for temp pacer placement.  He was intubated for airway protection prior. After arrival back in ICU, he was awake and following commands.  He was extubated shortly thereafter.  ASSESSMENT / PLAN:  Respiratory insufficiency - status post intubation due to inability to protect airway.  Extubated after arrival to ICU 9/18 and tolerated well. Atelectasis. Hx sleep apnea. P:   Bronchial hygiene. Incentive spirometry. Mobilize as able.  Cardiac arrest - unclear etiology at this point. Profound bradycardia - s/p temp pacer placement. Hx HLD, HOCM. P:  Cardiology and EP following / managing.  Hyponatremia. Hypokalemia. P:   33mEq K now. BMP in AM.  Family updated: None available.  Interdisciplinary Family Meeting v Palliative Care Meeting:  Due by: 08/02/17.  Nothing further to add.  PCCM will sign off.  Please do not hesitate to call us back if we can be of any further assistance.  Michael Barton, Michael Barton [facility] Pager: [phone]  or [phone] 07/26/2017, 7:44 AM  Attending Addendum: I personally examined this patient and agree with plan as detailed above.  31yoM s/p cardiac arrest with ROSC, Bradycardia requiring temporary transvenous pacer, now s/p placement of permanent pacemacer this AM and transvenous pacer removed. Patient denies SOB, CP, or any surgical site pain. He says he would like to sit up and having something to drink. Is on RA with no respiratory distress. Lungs CTA b/l. Consult PT/OT, OOBTC with assistance, IS and Flutter valve, start diet. Hypokalemia is repleted. Patient is stable for transfer from ICU to Tele at this point from my perspective; will ask Cardiology if they are agreeable to this. ICU will now sign off.   30 minutes spent with this patient; S3 visit   Michael Murders, MD Pulmonary and Critical Care

## 2017-07-27 ENCOUNTER — Inpatient Hospital Stay (HOSPITAL_COMMUNITY): Payer: Medicare Other

## 2017-07-27 NOTE — Evaluation (Signed)
Physical Therapy Evaluation Patient Details Name: Michael Barton MRN: [id number] DOB: 12-31-1945 Today's Date: 07/27/2017   History of Present Illness  Pt admitted after cardiac arrest and intermittent complete heart block. Pt s/p ablation at [facility] days prior to admission. Now s/p pacemaker. PMH:OSA, HLD, depression, tesiticular cancer.  Clinical Impression  Patient presents with limited AROM/use of LUE s/p pacemaker placement. Education on pacemaker precautions. Pt independent and active hiker/biker PTA. Pt still working and drives. Has support of housemate at home. Tolerated ambulation pushing IV pole with no evidence of LOB. HR stable 106-115 bpm. All education completed. Pt does not require skilled therapy services as pt functioning close to baseline. Discharge from therapy.    Follow Up Recommendations No PT follow up;Supervision - Intermittent    Equipment Recommendations  None recommended by PT    Recommendations for Other Services       Precautions / Restrictions Precautions Precautions: ICD/Pacemaker Precaution Comments: Educated on pacemaker precautions Required Braces or Orthoses: Sling (for 24 hours) Restrictions Weight Bearing Restrictions: No      Mobility  Bed Mobility Overal bed mobility: Modified Independent             General bed mobility comments: No assist needed. Use of rail with RUE, cues to decrease use of LUE during transfer.  Transfers Overall transfer level: Modified independent Equipment used: None (pushed IV pole)             General transfer comment: Stood from EOB x1, Dizzy initially. BP stable.  Ambulation/Gait Ambulation/Gait assistance: Supervision Ambulation Distance (Feet): 250 Feet Assistive device:  (pushed IV pole) Gait Pattern/deviations: Step-through pattern;Decreased stride length;Drifts right/left Gait velocity: decreased   General Gait Details: Slow, mostly steady gait pushing IV pole. HR up to 115 bpm. Bp  stable. Dizziness improved.   Stairs            Wheelchair Mobility    Modified Rankin (Stroke Patients Only)       Balance Overall balance assessment: Needs assistance Sitting-balance support: Feet supported;No upper extremity supported Sitting balance-Leahy Scale: Normal     Standing balance support: During functional activity Standing balance-Leahy Scale: Good                               Pertinent Vitals/Pain Pain Assessment: No/denies pain    Home Living Family/patient expects to be discharged to:: Private residence Living Arrangements: Non-relatives/Friends (housemate) Available Help at Discharge: Friend(s);Available PRN/intermittently Type of Home: House Home Access: Level entry     Home Layout: One level Home Equipment: None      Prior Function Level of Independence: Independent         Comments: Loves to hike and bike. 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     Hand Dominance   Dominant Hand: Right    Extremity/Trunk Assessment   Upper Extremity Assessment Upper Extremity Assessment: Defer to OT evaluation LUE Deficits / Details: pacemaker precautions, WFL elbow to hand    Lower Extremity Assessment Lower Extremity Assessment: Overall WFL for tasks assessed       Communication   Communication: No difficulties  Cognition Arousal/Alertness: Awake/alert Behavior During Therapy: WFL for tasks assessed/performed Overall Cognitive Status: Within Functional Limits for tasks assessed  General Comments General comments (skin integrity, edema, etc.): Standing BP pre activity 125/66. Sitting BP post activity 147/76.    Exercises     Assessment/Plan    PT Assessment Patent does not need any further PT services  PT Problem List         PT Treatment Interventions      PT Goals (Current goals can be found in the Care Plan section)  Acute Rehab PT Goals Patient Stated Goal:  to get back to running PT Goal Formulation: All assessment and education complete, DC therapy Time For Goal Achievement: 08/10/17 Potential to Achieve Goals: Fair    Frequency     Barriers to discharge        Co-evaluation               AM-PAC PT "6 Clicks" Daily Activity  Outcome Measure Difficulty turning over in bed (including adjusting bedclothes, sheets and blankets)?: None Difficulty moving from lying on back to sitting on the side of the bed? : None Difficulty sitting down on and standing up from a chair with arms (e.g., wheelchair, bedside commode, etc,.)?: None Help needed moving to and from a bed to chair (including a wheelchair)?: None Help needed walking in hospital room?: None Help needed climbing 3-5 steps with a railing? : None 6 Click Score: 24    End of Session Equipment Utilized During Treatment: Gait belt Activity Tolerance: Patient tolerated treatment well Patient left: with call bell/phone within reach;in bed (sitting EOB.) Nurse Communication: Mobility status PT Visit Diagnosis: Unsteadiness on feet (R26.81)    Time: 1607-3710 PT Time Calculation (min) (ACUTE ONLY): 20 min   Charges:   PT Evaluation $PT Eval Low Complexity: 1 Low     PT G CodesWray Barton, PT, DPT [phone]    Marguarite Arbour A Abie Cheek 07/27/2017, 10:29 AM

## 2017-07-27 NOTE — Evaluation (Signed)
Occupational Therapy Evaluation and Discharge Patient Details Name: Michael Barton MRN: [id number] DOB: 12/04/1945 Today's Date: 07/27/2017    History of Present Illness Pt admitted after cardiac arrest and intermittent complete heart block. Pt s/p ablation at [facility] days prior to admission. Now s/p pacemaker. PMH:OSA, HLD, depression, tesiticular cancer.   Clinical Impression   Pt was independent prior to admission. Walks in neighborhood regularly. Pt eager to go home. Educated in pacemaker precautions related to ADL and IADL and sling use. Pt verbalize and demonstrated understanding. No further OT or equipment needs.     Follow Up Recommendations  No OT follow up    Equipment Recommendations  None recommended by OT    Recommendations for Other Services       Precautions / Restrictions Precautions Precautions: ICD/Pacemaker Precaution Comments: pt able to state and generalize pacemaker precautions Required Braces or Orthoses: Sling (for pacemaker precautions) Restrictions Weight Bearing Restrictions: No      Mobility Bed Mobility               General bed mobility comments: pt seated at EOB  Transfers Overall transfer level: Modified independent Equipment used: None (pushed IV pole)                  Balance Overall balance assessment: Needs assistance   Sitting balance-Leahy Scale: Normal       Standing balance-Leahy Scale: Good                             ADL either performed or assessed with clinical judgement   ADL Overall ADL's : Modified independent                                       General ADL Comments: Educated pt in UB dressing adhering to precautions and modification of IADL. Recommended use of sling with sleeping.     Vision Baseline Vision/History: No visual deficits Patient Visual Report: No change from baseline       Perception     Praxis      Pertinent Vitals/Pain Pain Assessment:  No/denies pain     Hand Dominance Right   Extremity/Trunk Assessment Upper Extremity Assessment Upper Extremity Assessment: LUE deficits/detail LUE Deficits / Details: pacemaker precautions, WFL elbow to hand   Lower Extremity Assessment Lower Extremity Assessment: Defer to PT evaluation       Communication Communication Communication: No difficulties   Cognition Arousal/Alertness: Awake/alert Behavior During Therapy: WFL for tasks assessed/performed Overall Cognitive Status: Within Functional Limits for tasks assessed                                     General Comments       Exercises     Shoulder Instructions      Home Living Family/patient expects to be discharged to:: Private residence Living Arrangements: Non-relatives/Friends (housemate) Available Help at Discharge: Friend(s);Available PRN/intermittently Type of Home: House Home Access: Level entry     Home Layout: One level     Bathroom Shower/Tub: Teacher, early years/pre: Standard     Home Equipment: None          Prior Functioning/Environment Level of Independence: Independent  OT Problem List: Impaired UE functional use;Decreased knowledge of precautions      OT Treatment/Interventions:      OT Goals(Current goals can be found in the care plan section) Acute Rehab OT Goals Patient Stated Goal: to go home ASAP  OT Frequency:     Barriers to D/C:            Co-evaluation              AM-PAC PT "6 Clicks" Daily Activity     Outcome Measure Help from another person eating meals?: None Help from another person taking care of personal grooming?: None Help from another person toileting, which includes using toliet, bedpan, or urinal?: None Help from another person bathing (including washing, rinsing, drying)?: None Help from another person to put on and taking off regular upper body clothing?: None Help from another person to put on  and taking off regular lower body clothing?: None 6 Click Score: 24   End of Session Equipment Utilized During Treatment: Gait belt  Activity Tolerance: Patient tolerated treatment well Patient left: in chair;with call bell/phone within reach  OT Visit Diagnosis: Other (comment) (decreased activity tolerance)                Time: 0017-4944 OT Time Calculation (min): 18 min Charges:  OT General Charges $OT Visit: 1 Visit OT Evaluation $OT Eval Low Complexity: 1 Low G-Codes:     19-Aug-2017 Nestor Lewandowsky, OTR/L Pager: [place], Haze Boyden 08-19-2017, 10:21 AM

## 2017-07-27 NOTE — Progress Notes (Signed)
The patient has been given his discharge instructions along with a medication list for what to take today. He has been educated on limited movement post pacer and site care. He has follow up appointments. He is discharging with friend Stanton Kidney via car.     Saddie Benders RN

## 2017-07-28 ENCOUNTER — Encounter: Payer: Self-pay | Admitting: Cardiology

## 2017-07-28 ENCOUNTER — Telehealth: Payer: Self-pay | Admitting: Cardiovascular Disease

## 2017-07-28 NOTE — Telephone Encounter (Signed)
New Message   pt verbalized that he is calling for rn   Wants to make sure he is suppose to come in for the appt on Monday

## 2017-07-28 NOTE — Telephone Encounter (Signed)
Pt aware to keep appt as scheduled on Monday with Dr Acie Fredrickson.

## 2017-07-29 ENCOUNTER — Emergency Department (HOSPITAL_COMMUNITY): Payer: Medicare Other

## 2017-07-29 ENCOUNTER — Encounter (HOSPITAL_COMMUNITY): Payer: Self-pay

## 2017-07-29 ENCOUNTER — Observation Stay (HOSPITAL_COMMUNITY): Payer: Medicare Other

## 2017-07-29 ENCOUNTER — Observation Stay (HOSPITAL_COMMUNITY)
Admission: EM | Admit: 2017-07-29 | Discharge: 2017-07-30 | Disposition: A | Discharge status: Home / Self Care | Payer: Medicare Other | Attending: Cardiology | Admitting: Cardiology

## 2017-07-29 DIAGNOSIS — Z809 Family history of malignant neoplasm, unspecified: Secondary | ICD-10-CM | POA: Insufficient documentation

## 2017-07-29 DIAGNOSIS — R748 Abnormal levels of other serum enzymes: Secondary | ICD-10-CM | POA: Insufficient documentation

## 2017-07-29 DIAGNOSIS — S199XXA Unspecified injury of neck, initial encounter: Secondary | ICD-10-CM | POA: Diagnosis not present

## 2017-07-29 DIAGNOSIS — I951 Orthostatic hypotension: Secondary | ICD-10-CM

## 2017-07-29 DIAGNOSIS — Z8547 Personal history of malignant neoplasm of testis: Secondary | ICD-10-CM | POA: Insufficient documentation

## 2017-07-29 DIAGNOSIS — Z9079 Acquired absence of other genital organ(s): Secondary | ICD-10-CM | POA: Diagnosis not present

## 2017-07-29 DIAGNOSIS — R404 Transient alteration of awareness: Secondary | ICD-10-CM | POA: Diagnosis not present

## 2017-07-29 DIAGNOSIS — I459 Conduction disorder, unspecified: Secondary | ICD-10-CM

## 2017-07-29 DIAGNOSIS — W19XXXA Unspecified fall, initial encounter: Secondary | ICD-10-CM | POA: Diagnosis not present

## 2017-07-29 DIAGNOSIS — I442 Atrioventricular block, complete: Secondary | ICD-10-CM | POA: Diagnosis not present

## 2017-07-29 DIAGNOSIS — I421 Obstructive hypertrophic cardiomyopathy: Secondary | ICD-10-CM | POA: Diagnosis not present

## 2017-07-29 DIAGNOSIS — I4510 Unspecified right bundle-branch block: Secondary | ICD-10-CM | POA: Insufficient documentation

## 2017-07-29 DIAGNOSIS — G4733 Obstructive sleep apnea (adult) (pediatric): Secondary | ICD-10-CM | POA: Diagnosis not present

## 2017-07-29 DIAGNOSIS — F329 Major depressive disorder, single episode, unspecified: Secondary | ICD-10-CM | POA: Insufficient documentation

## 2017-07-29 DIAGNOSIS — Z79899 Other long term (current) drug therapy: Secondary | ICD-10-CM | POA: Insufficient documentation

## 2017-07-29 DIAGNOSIS — Z950 Presence of cardiac pacemaker: Secondary | ICD-10-CM | POA: Insufficient documentation

## 2017-07-29 DIAGNOSIS — Z8349 Family history of other endocrine, nutritional and metabolic diseases: Secondary | ICD-10-CM | POA: Insufficient documentation

## 2017-07-29 DIAGNOSIS — I2510 Atherosclerotic heart disease of native coronary artery without angina pectoris: Secondary | ICD-10-CM | POA: Diagnosis not present

## 2017-07-29 DIAGNOSIS — R7989 Other specified abnormal findings of blood chemistry: Secondary | ICD-10-CM

## 2017-07-29 DIAGNOSIS — E7800 Pure hypercholesterolemia, unspecified: Secondary | ICD-10-CM | POA: Diagnosis present

## 2017-07-29 DIAGNOSIS — R0789 Other chest pain: Secondary | ICD-10-CM

## 2017-07-29 DIAGNOSIS — Z9109 Other allergy status, other than to drugs and biological substances: Secondary | ICD-10-CM | POA: Insufficient documentation

## 2017-07-29 DIAGNOSIS — R55 Syncope and collapse: Principal | ICD-10-CM | POA: Insufficient documentation

## 2017-07-29 DIAGNOSIS — S0990XA Unspecified injury of head, initial encounter: Secondary | ICD-10-CM | POA: Diagnosis not present

## 2017-07-29 DIAGNOSIS — Z8674 Personal history of sudden cardiac arrest: Secondary | ICD-10-CM | POA: Insufficient documentation

## 2017-07-29 DIAGNOSIS — R778 Other specified abnormalities of plasma proteins: Secondary | ICD-10-CM | POA: Diagnosis present

## 2017-07-29 DIAGNOSIS — E784 Other hyperlipidemia: Secondary | ICD-10-CM | POA: Diagnosis not present

## 2017-07-29 DIAGNOSIS — S0003XA Contusion of scalp, initial encounter: Secondary | ICD-10-CM | POA: Diagnosis not present

## 2017-07-29 DIAGNOSIS — Z818 Family history of other mental and behavioral disorders: Secondary | ICD-10-CM | POA: Insufficient documentation

## 2017-07-29 DIAGNOSIS — Z7982 Long term (current) use of aspirin: Secondary | ICD-10-CM | POA: Diagnosis not present

## 2017-07-29 DIAGNOSIS — F32A Depression, unspecified: Secondary | ICD-10-CM | POA: Diagnosis present

## 2017-07-29 DIAGNOSIS — R079 Chest pain, unspecified: Secondary | ICD-10-CM | POA: Diagnosis not present

## 2017-07-29 DIAGNOSIS — E785 Hyperlipidemia, unspecified: Secondary | ICD-10-CM | POA: Insufficient documentation

## 2017-07-29 LAB — BASIC METABOLIC PANEL
Anion gap: 8 (ref 5–15)
BUN: 21 mg/dL — ABNORMAL HIGH (ref 6–20)
CHLORIDE: 95 mmol/L — AB (ref 101–111)
CO2: 28 mmol/L (ref 22–32)
CREATININE: 1.44 mg/dL — AB (ref 0.61–1.24)
Calcium: 8.5 mg/dL — ABNORMAL LOW (ref 8.9–10.3)
GFR calc non Af Amer: 48 mL/min — ABNORMAL LOW (ref >60)
GFR, EST AFRICAN AMERICAN: 55 mL/min — AB (ref >60)
Glucose, Bld: 104 mg/dL — ABNORMAL HIGH (ref 65–99)
POTASSIUM: 4.1 mmol/L (ref 3.5–5.1)
SODIUM: 131 mmol/L — AB (ref 135–145)

## 2017-07-29 LAB — CBC
HEMATOCRIT: 33.2 % — AB (ref 39.0–52.0)
HEMOGLOBIN: 11.2 g/dL — AB (ref 13.0–17.0)
MCH: 29.1 pg (ref 26.0–34.0)
MCHC: 33.7 g/dL (ref 30.0–36.0)
MCV: 86.2 fL (ref 78.0–100.0)
Platelets: 240 10*3/uL (ref 150–400)
RBC: 3.85 MIL/uL — ABNORMAL LOW (ref 4.22–5.81)
RDW: 13 % (ref 11.5–15.5)
WBC: 5.2 10*3/uL (ref 4.0–10.5)

## 2017-07-29 LAB — TROPONIN I
TROPONIN I: 1.17 ng/mL — AB (ref <0.03)
Troponin I: 0.98 ng/mL (ref <0.03)
Troponin I: 1.16 ng/mL (ref <0.03)

## 2017-07-29 LAB — CBC WITH DIFFERENTIAL/PLATELET
BASOS PCT: 1 %
Basophils Absolute: 0 10*3/uL (ref 0.0–0.1)
EOS ABS: 0.4 10*3/uL (ref 0.0–0.7)
EOS PCT: 7 %
HCT: 32.8 % — ABNORMAL LOW (ref 39.0–52.0)
HEMOGLOBIN: 11.3 g/dL — AB (ref 13.0–17.0)
LYMPHS ABS: 1.4 10*3/uL (ref 0.7–4.0)
Lymphocytes Relative: 22 %
MCH: 29.5 pg (ref 26.0–34.0)
MCHC: 34.5 g/dL (ref 30.0–36.0)
MCV: 85.6 fL (ref 78.0–100.0)
MONOS PCT: 12 %
Monocytes Absolute: 0.7 10*3/uL (ref 0.1–1.0)
NEUTROS PCT: 58 %
Neutro Abs: 3.7 10*3/uL (ref 1.7–7.7)
PLATELETS: 201 10*3/uL (ref 150–400)
RBC: 3.83 MIL/uL — ABNORMAL LOW (ref 4.22–5.81)
RDW: 13.2 % (ref 11.5–15.5)
WBC: 6.3 10*3/uL (ref 4.0–10.5)

## 2017-07-29 LAB — COMPREHENSIVE METABOLIC PANEL
ALBUMIN: 3.4 g/dL — AB (ref 3.5–5.0)
ALT: 24 U/L (ref 17–63)
AST: 23 U/L (ref 15–41)
Alkaline Phosphatase: 62 U/L (ref 38–126)
Anion gap: 7 (ref 5–15)
BILIRUBIN TOTAL: 0.4 mg/dL (ref 0.3–1.2)
BUN: 16 mg/dL (ref 6–20)
CHLORIDE: 98 mmol/L — AB (ref 101–111)
CO2: 28 mmol/L (ref 22–32)
Calcium: 8.6 mg/dL — ABNORMAL LOW (ref 8.9–10.3)
Creatinine, Ser: 1.29 mg/dL — ABNORMAL HIGH (ref 0.61–1.24)
GFR calc Af Amer: >60 mL/min (ref >60)
GFR, EST NON AFRICAN AMERICAN: 55 mL/min — AB (ref >60)
Glucose, Bld: 89 mg/dL (ref 65–99)
POTASSIUM: 3.8 mmol/L (ref 3.5–5.1)
Sodium: 133 mmol/L — ABNORMAL LOW (ref 135–145)
TOTAL PROTEIN: 5.9 g/dL — AB (ref 6.5–8.1)

## 2017-07-29 LAB — PROTIME-INR
INR: 1.08
Prothrombin Time: 14 seconds (ref 11.4–15.2)

## 2017-07-29 LAB — I-STAT TROPONIN, ED: TROPONIN I, POC: 0.63 ng/mL — AB (ref 0.00–0.08)

## 2017-07-29 LAB — BRAIN NATRIURETIC PEPTIDE: B Natriuretic Peptide: 188.9 pg/mL — ABNORMAL HIGH (ref 0.0–100.0)

## 2017-07-29 LAB — HEMOGLOBIN A1C
HEMOGLOBIN A1C: 5.5 % (ref 4.8–5.6)
MEAN PLASMA GLUCOSE: 111.15 mg/dL

## 2017-07-29 LAB — TSH: TSH: 2.904 u[IU]/mL (ref 0.350–4.500)

## 2017-07-29 MED ORDER — LAMOTRIGINE 100 MG PO TABS
200.0000 mg | ORAL_TABLET | Freq: Every day | ORAL | Status: DC
  Administered 2017-07-29 – 2017-07-30 (×2): 200 mg via ORAL
  Filled 2017-07-29: qty 1
  Filled 2017-07-29: qty 2
  Filled 2017-07-29: qty 1

## 2017-07-29 MED ORDER — ASPIRIN EC 81 MG PO TBEC
81.0000 mg | DELAYED_RELEASE_TABLET | Freq: Every day | ORAL | Status: DC
  Administered 2017-07-29: 81 mg via ORAL

## 2017-07-29 MED ORDER — BUPROPION HCL ER (SR) 100 MG PO TB12
100.0000 mg | ORAL_TABLET | Freq: Two times a day (BID) | ORAL | Status: DC
  Administered 2017-07-29 – 2017-07-30 (×3): 100 mg via ORAL
  Filled 2017-07-29 (×3): qty 1

## 2017-07-29 MED ORDER — QUETIAPINE FUMARATE 200 MG PO TABS
200.0000 mg | ORAL_TABLET | Freq: Every day | ORAL | Status: DC
  Administered 2017-07-29: 200 mg via ORAL
  Filled 2017-07-29: qty 4
  Filled 2017-07-29: qty 1

## 2017-07-29 MED ORDER — METOPROLOL SUCCINATE ER 100 MG PO TB24
100.0000 mg | ORAL_TABLET | Freq: Every day | ORAL | Status: DC
  Administered 2017-07-30: 100 mg via ORAL
  Filled 2017-07-29: qty 1

## 2017-07-29 MED ORDER — VENLAFAXINE HCL ER 150 MG PO CP24
150.0000 mg | ORAL_CAPSULE | Freq: Every day | ORAL | Status: DC
  Administered 2017-07-30: 150 mg via ORAL
  Filled 2017-07-29: qty 1

## 2017-07-29 MED ORDER — ASPIRIN EC 81 MG PO TBEC
81.0000 mg | DELAYED_RELEASE_TABLET | Freq: Every day | ORAL | Status: DC
  Administered 2017-07-29 – 2017-07-30 (×2): 81 mg via ORAL
  Filled 2017-07-29 (×3): qty 1

## 2017-07-29 MED ORDER — PRAVASTATIN SODIUM 40 MG PO TABS
40.0000 mg | ORAL_TABLET | Freq: Every day | ORAL | Status: DC
  Administered 2017-07-29 – 2017-07-30 (×2): 40 mg via ORAL
  Filled 2017-07-29 (×2): qty 1

## 2017-07-29 MED ORDER — TESTOSTERONE 50 MG/5GM (1%) TD GEL: 1.2500 g | Freq: Every day | TRANSDERMAL | Status: DC

## 2017-07-29 MED ORDER — SODIUM CHLORIDE 0.9 % IV BOLUS (SEPSIS)
500.0000 mL | Freq: Once | INTRAVENOUS | Status: AC
  Administered 2017-07-29: 500 mL via INTRAVENOUS

## 2017-07-29 MED ORDER — HEPARIN SODIUM (PORCINE) 5000 UNIT/ML IJ SOLN
5000.0000 [IU] | Freq: Three times a day (TID) | INTRAMUSCULAR | Status: DC
  Administered 2017-07-29 – 2017-07-30 (×3): 5000 [IU] via SUBCUTANEOUS
  Filled 2017-07-29 (×3): qty 1

## 2017-07-29 NOTE — ED Notes (Signed)
Taken to xray at this time. 

## 2017-07-29 NOTE — ED Provider Notes (Signed)
TIME SEEN: 2:15 AM  CHIEF COMPLAINT: chest pain, near syncope  HPI: Pt is a 71 y.o. M with h/o HOCM, hyperlipidemia who presents emergency department with anterior chest tightness in the near syncopal event that occurred just prior to arrival. Patient was seen at [facility] and had an alcohol septal ablation by Dr. Alveta Heimlich on September 12. Was discharged on September 14 doing well. Presented back to our emergency department on September 18 and had multiple episodes of cardiac arrest. Was found to have a complete heart block. Had a dual-chamber pacemaker placed by Dr. Lovena Le on September 19. He came back to the emergency department today because he was sitting on a couch and felt diffuse anterior chest tightness. No associated shortness of breath, nausea vomiting, diaphoresis. He states he stood up and felt very lightheaded like he may pass out. Vertigo. States he did fall to the floor and striking his head but did not pass out. He had to crawl back to the couch and call 911. He did not have a syncopal event. No chest pain currently but does feel dizzy with standing.  ROS: See HPI Constitutional: no fever  Eyes: no drainage  ENT: no runny nose   Cardiovascular:   chest pain  Resp: no SOB  GI: no vomiting GU: no dysuria Integumentary: no rash  Allergy: no hives  Musculoskeletal: no leg swelling  Neurological: no slurred speech ROS otherwise negative  PAST MEDICAL HISTORY/PAST SURGICAL HISTORY:  Past Medical History:  Diagnosis Date  . Depression   . History of orchiectomy   . HOCM (hypertrophic obstructive cardiomyopathy) ([place]) 06/24/2016   cMRI 3/18: Severe asymm septal hypertrophy, EF 74, SAM, mod MR, turb flow sugg of significant LVOT gradient, picture c/w HOCM // Echo 6/17: Mild conc LVH, LVOT gradient 14, EF 65-70, mild MR // CP stress test 6/17: NL functional capacity compared to matched sedentary norms. Pt primarily limited by body habitus and deconditioning // Echo 9/12: EF 69.8, LVOT  gradient 16   . HOCM (hypertrophic obstructive cardiomyopathy) ([place])   . Hyperlipidemia   . Sleep apnea   . Testicular cancer [facility])     MEDICATIONS:  Prior to Admission medications   Medication Sig Start Date End Date Taking? Authorizing Provider  aspirin EC 81 MG tablet Take 1 tablet (81 mg total) by mouth daily. 04/06/17   Nahser, Wonda Cheng, MD  aspirin EC 81 MG tablet Take 81 mg by mouth daily.    [provider]  buPROPion (WELLBUTRIN SR) 100 MG 12 hr tablet Take 100 mg by mouth 2 (two) times daily.     [provider]  buPROPion (WELLBUTRIN SR) 100 MG 12 hr tablet Take 100 mg by mouth 2 (two) times daily.    [provider]  lamoTRIgine (LAMICTAL) 200 MG tablet Take 200 mg by mouth daily.    [provider]  lamoTRIgine (LAMICTAL) 200 MG tablet Take 200 mg by mouth daily.    [provider]  metoprolol succinate (TOPROL-XL) 100 MG 24 hr tablet Take 100 mg by mouth daily. 07/21/17 07/21/18  [provider]  metoprolol succinate (TOPROL-XL) 100 MG 24 hr tablet Take 100 mg by mouth daily. Take with or immediately following a meal.    [provider]  pravastatin (PRAVACHOL) 40 MG tablet Take 40 mg by mouth daily.    [provider]  pravastatin (PRAVACHOL) 40 MG tablet Take 40 mg by mouth daily.    [provider]  QUEtiapine (SEROQUEL) 200 MG  tablet Take 200 mg by mouth at bedtime.    [provider]  QUEtiapine (SEROQUEL) 200 MG tablet Take 200 mg by mouth at bedtime.    [provider]  testosterone (ANDROGEL) 50 MG/5GM (1%) GEL Place 2.5 g onto the skin See admin instructions. Apply one pump (1.25 gram) to each shoulder once daily.    [provider]  venlafaxine XR (EFFEXOR-XR) 150 MG 24 hr capsule Take 150 mg by mouth daily with breakfast.    [provider]  venlafaxine XR (EFFEXOR-XR) 150 MG 24 hr capsule Take 150 mg by mouth daily with breakfast.    [provider]    ALLERGIES:  Allergies  Allergen Reactions  . Ambien [Zolpidem Tartrate]      GERD   . Ambien [Zolpidem Tartrate] Other (See Comments)    GERD  . Prozac [Fluoxetine Hcl]     Gives a weird feeling   . Prozac [Fluoxetine] Other (See Comments)    "makes me feel weird"    SOCIAL HISTORY:  Social History  Substance Use Topics  . Smoking status: Never Smoker  . Smokeless tobacco: Never Used  . Alcohol use No    FAMILY HISTORY: Family History  Problem Relation Age of Onset  . Thyroid disease Mother   . Depression Mother   . Cancer Father     EXAM: BP 105/62 (BP Location: Right Arm)   Pulse 62   Temp 97.9 F (36.6 C) (Oral)   Resp 16   Ht 6\' 2"  (1.88 m)   Wt 94.8 kg (209 lb)   SpO2 97%   BMI 26.83 kg/m  CONSTITUTIONAL: Alert and oriented and responds appropriately to questions. Elderly, in no distress HEAD: Normocephalic, atraumatic EYES: Conjunctivae clear, pupils appear equal, EOMI ENT: normal nose; moist mucous membranes NECK: Supple, no meningismus, no nuchal rigidity, no LAD no midline spinal tenderness or step-off or deformity  CARD: RRR; S1 and S2 appreciated; no murmurs, no clicks, no rubs, no gallops CHEST:  Chest wall is nontender to palpation.  No crepitus, ecchymosis, erythema, warmth, rash or other lesions present.   RESP: Normal chest excursion without splinting or tachypnea; breath sounds clear and equal bilaterally; no wheezes, no rhonchi, no rales, no hypoxia or respiratory distress, speaking full sentences ABD/GI: Normal bowel sounds; non-distended; soft, non-tender, no rebound, no guarding, no peritoneal signs, no hepatosplenomegaly BACK:  The back appears normal and is non-tender to palpation, there is no CVA tenderness, no midline spinal tenderness or step-off or deformity EXT: Normal ROM in all joints; non-tender to palpation; no edema; normal capillary refill; no cyanosis, no calf tenderness or swelling    SKIN: Normal color for age and race;  warm; no rash NEURO: Moves all extremities equally, sensation to light touch intact diffusely, cranial nerves II through XII intact, normal speech, no pronator drift PSYCH: The patient's mood and manner are appropriate. Grooming and personal hygiene are appropriate.  MEDICAL DECISION MAKING: Pt here with episode of chest pain and likely orthostatic hypotension. Had a near syncopal event with standing. He does have a history of HOCM and recent complete heart block with pacemaker. We will interrogate his pacemaker and obtain cardiac labs. He did hit his head when he fell. Will obtain CT of the head and cervical spine. Neurologically intact this time. Denies pain on blood thinners. Concern for possible ACS, arrhythmia. No chest pain or shortness of breath currently. Doubt PE or dissection.  No recent infectious symptoms. He is currently on a  beta blocker for his HOCM.  ED PROGRESS: PPM interrogated in device is functioning normally without any events. Troponin mildly positive but this may be trending down from recent cardiac arrest. CT of the head and cervical spine unremarkable chest. Chest x-ray shows good placement of the pacemaker and otherwise unremarkable. We'll discuss with cardiology to see patient.  3:51 AM  Dr. Eula Fried with cardiology to see in ED.   Cards to admit for obs.  Patient is orthostatic. Receiving 500 mL of IV fluids per cardiology recommendation. Cardiology states they feel his mildly elevated troponin is likely residual from recent admission.  I reviewed all nursing notes, vitals, pertinent previous records, EKGs, lab and urine results, imaging (as available).    EKG Interpretation  Date/Time:  Saturday July 29 2017 01:46:25 EDT Ventricular Rate:  63 PR Interval:    QRS Duration: 143 QT Interval:  462 QTC Calculation: 473 R Axis:   52 Text Interpretation:  Sinus rhythm Prolonged PR interval Right bundle branch block Abnormal T, consider ischemia, lateral leads No old  tracing to compare Reconfirmed by Elizabelle Fite, Cyril Mourning [phone]) on 07/29/2017 3:00:38 AM         Grethel Zenk, Delice Bison, DO 07/29/17 [phone]

## 2017-07-29 NOTE — ED Notes (Signed)
Admitting MD at bedside.

## 2017-07-29 NOTE — H&P (Signed)
CARDIOLOGY OBSERVATION HISTORY AND PHYSICAL EXAMINATION NOTE  Patient ID: Michael Barton MRN: [id number], DOB/AGE: 1946/08/15   Admit date: 07/29/2017   Primary Physician: Gaynelle Arabian, MD Primary Cardiologist: Mertie Moores MD  Reason for admission: syncope  HPI: This is a 71 y.o. male with significant history of HOCM with recent alcohol septal ablation on 9/12 and post procedure complication of RBBB who developed cardiac arrest requiring [employer] PPM implantation on 9/19 at [facility] by Dr. Lovena Le presented with syncope.  Patient today was laying down in the couch. After an hour he stood up and walked for a couple of steps when felt dizzy and fell back and hit his head. He did not loose consciousness. He had associated nausea. He did take Seroquel and a glass of wine prior to this episode.  Of note, patient initially presented with HOCM to Dr. Acie Fredrickson in 2017. In June 2018 he presented with progressive dyspnea and near syncope on beta blocker. He underwent cardiac cath which showed minimal nonobstructive CAD, LVOT gradient of 55 mm Hg with post PVC gradient of 120 mm Hg. He was referred to Dr. Mina Marble in [place] who performed alcohol septal ablation on 9/12.   He was doing well post procedurally until when he developed cardiac arrest.   Problem List: Past Medical History:  Diagnosis Date  . Depression   . History of orchiectomy   . HOCM (hypertrophic obstructive cardiomyopathy) ([place]) 06/24/2016   cMRI 3/18: Severe asymm septal hypertrophy, EF 74, SAM, mod MR, turb flow sugg of significant LVOT gradient, picture c/w HOCM // Echo 6/17: Mild conc LVH, LVOT gradient 14, EF 65-70, mild MR // CP stress test 6/17: NL functional capacity compared to matched sedentary norms. Pt primarily limited by body habitus and deconditioning // Echo 9/12: EF 69.8, LVOT gradient 16   . HOCM (hypertrophic obstructive cardiomyopathy) ([place])   . Hyperlipidemia   . Sleep apnea   . Testicular cancer [facility])     Past  Surgical History:  Procedure Laterality Date  . ORCHIECTOMY    . PACEMAKER IMPLANT N/A 07/26/2017   Procedure: Pacemaker Implant;  Surgeon: Evans Lance, MD;  Location: [place] [facility];  Service: Cardiovascular;  Laterality: N/A;  . RIGHT/LEFT HEART CATH AND CORONARY ANGIOGRAPHY N/A 04/10/2017   Procedure: Right/Left Heart Cath and Coronary Angiography;  Surgeon: Nelva Bush, MD;  Location: [facility];  Service: Cardiovascular;  Laterality: N/A;  . TEMPORARY PACEMAKER N/A 07/25/2017   Procedure: Temporary Pacemaker;  Surgeon: Sherren Mocha, MD;  Location: [place] [facility];  Service: Cardiovascular;  Laterality: N/A;     Allergies:  Allergies  Allergen Reactions  . Ambien [Zolpidem Tartrate]      GERD   . Ambien [Zolpidem Tartrate] Other (See Comments)    GERD  . Prozac [Fluoxetine Hcl]     Gives a weird feeling   . Prozac [Fluoxetine] Other (See Comments)    "makes me feel weird"     Home Medications No current facility-administered medications for this encounter.    Current Outpatient Prescriptions  Medication Sig Dispense Refill  . aspirin EC 81 MG tablet Take 1 tablet (81 mg total) by mouth daily.    Marland Kitchen buPROPion (WELLBUTRIN SR) 100 MG 12 hr tablet Take 100 mg by mouth 2 (two) times daily.    Marland Kitchen lamoTRIgine (LAMICTAL) 200 MG tablet Take 200 mg by mouth daily.    . metoprolol succinate (TOPROL-XL) 100 MG 24 hr tablet Take 100 mg by mouth daily. Take with  or immediately following a meal.    . pravastatin (PRAVACHOL) 40 MG tablet Take 40 mg by mouth daily.    . QUEtiapine (SEROQUEL) 200 MG tablet Take 200 mg by mouth at bedtime.    Marland Kitchen testosterone (ANDROGEL) 50 MG/5GM (1%) GEL Place 1.25 g onto the skin daily. to each shoulder    . venlafaxine XR (EFFEXOR-XR) 150 MG 24 hr capsule Take 150 mg by mouth daily with breakfast.       Family History  Problem Relation Age of Onset  . Thyroid disease Mother   . Depression Mother   . Cancer Father      Social  History   Social History  . Marital status: Single    Spouse name: N/A  . Number of children: N/A  . Years of education: N/A   Occupational History  . Not on file.   Social History Main Topics  . Smoking status: Never Smoker  . Smokeless tobacco: Never Used  . Alcohol use No  . Drug use: No  . Sexual activity: Not on file   Other Topics Concern  . Not on file   Social History Narrative   ** Merged History Encounter **         Review of Systems: General: negative for chills, fever, night sweats or weight changes.  Cardiovascular:presyncope negative for dyspnea on exertion, edema, orthopnea, palpitations, paroxysmal nocturnal dyspnea  Dermatological: negative for rash Respiratory: negative for cough or wheezing Urologic: negative for hematuria Abdominal: negative for nausea, vomiting, diarrhea, bright red blood per rectum, melena, or hematemesis Neurologic: negative for visual changes, syncope, or dizziness Endocrine: no diabetes, no hypothyroidism Immunological: no lymph adenopathy Psych: non homicidal/suicidal  Physical Exam: Vitals: BP 113/76   Pulse (!) 59   Temp 97.9 F (36.6 C) (Oral)   Resp 16   Ht 6\' 2"  (1.88 m)   Wt 94.8 kg (209 lb)   SpO2 98%   BMI 26.83 kg/m  General: not in acute distress Neck: JVP flat, neck supple Heart: regular rate and rhythm, S1, S2, systolic grade II/VI murmur Lungs: CTAB  GI: non tender, non distended, bowel sounds present Extremities: no edema Neuro: AAO x 3  Psych: normal affect, no anxiety   Labs:   Results for orders placed or performed during the hospital encounter of 07/29/17 (from the past 24 hour(s))  Basic metabolic panel     Status: Abnormal   Collection Time: 07/29/17  2:21 AM  Result Value Ref Range   Sodium 131 (L) 135 - 145 mmol/L   Potassium 4.1 3.5 - 5.1 mmol/L   Chloride 95 (L) 101 - 111 mmol/L   CO2 28 22 - 32 mmol/L   Glucose, Bld 104 (H) 65 - 99 mg/dL   BUN 21 (H) 6 - 20 mg/dL   Creatinine,  Ser 1.44 (H) 0.61 - 1.24 mg/dL   Calcium 8.5 (L) 8.9 - 10.3 mg/dL   GFR calc non Af Amer 48 (L) >60 mL/min   GFR calc Af Amer 55 (L) >60 mL/min   Anion gap 8 5 - 15  CBC WITH DIFFERENTIAL     Status: Abnormal   Collection Time: 07/29/17  2:21 AM  Result Value Ref Range   WBC 6.3 4.0 - 10.5 K/uL   RBC 3.83 (L) 4.22 - 5.81 MIL/uL   Hemoglobin 11.3 (L) 13.0 - 17.0 g/dL   HCT 32.8 (L) 39.0 - 52.0 %   MCV 85.6 78.0 - 100.0 fL   MCH 29.5 26.0 -  34.0 pg   MCHC 34.5 30.0 - 36.0 g/dL   RDW 13.2 11.5 - 15.5 %   Platelets 201 150 - 400 K/uL   Neutrophils Relative % 58 %   Neutro Abs 3.7 1.7 - 7.7 K/uL   Lymphocytes Relative 22 %   Lymphs Abs 1.4 0.7 - 4.0 K/uL   Monocytes Relative 12 %   Monocytes Absolute 0.7 0.1 - 1.0 K/uL   Eosinophils Relative 7 %   Eosinophils Absolute 0.4 0.0 - 0.7 K/uL   Basophils Relative 1 %   Basophils Absolute 0.0 0.0 - 0.1 K/uL  I-stat troponin, ED     Status: Abnormal   Collection Time: 07/29/17  3:06 AM  Result Value Ref Range   Troponin i, poc 0.63 (HH) 0.00 - 0.08 ng/mL   Comment NOTIFIED PHYSICIAN    Comment 3             Radiology/Studies: Dg Chest 2 View  Result Date: 07/27/2017 CLINICAL DATA:  Pacemaker placement. EXAM: CHEST  2 VIEW COMPARISON:  No prior. FINDINGS: Cardiac pacer with lead tips in right atrium right ventricle. Cardiomegaly. Normal pulmonary vascularity. Mild left base atelectasis. Left base pleural thickening, small effusion cannot be excluded. No pneumothorax. IMPRESSION: 1. Cardiac pacer with lead tips in right atrium and right ventricle. Cardiomegaly. No pulmonary venous congestion. 2. Low lung volumes. Mild left base atelectasis. Small left pleural effusion cannot be excluded. Electronically Signed   By: Marcello Moores  Register   On: 07/27/2017 07:53   Ct Head Wo Contrast  Result Date: 07/29/2017 CLINICAL DATA:  Syncopal episode after getting up from seated position. Struck head on floor. Nausea. Pacemaker placed 3 days ago. History of  hypertrophic cardiomyopathy, testicular cancer. EXAM: CT HEAD WITHOUT CONTRAST CT CERVICAL SPINE WITHOUT CONTRAST TECHNIQUE: Multidetector CT imaging of the head and cervical spine was performed following the standard protocol without intravenous contrast. Multiplanar CT image reconstructions of the cervical spine were also generated. COMPARISON:  None. FINDINGS: CT HEAD FINDINGS BRAIN: No intraparenchymal hemorrhage, mass effect nor midline shift. The ventricles and sulci are normal for age. Patchy supratentorial white matter hypodensities less than expected for patient's age, though non-specific are most compatible with chronic small vessel ischemic disease. No acute large vascular territory infarcts. No abnormal extra-axial fluid collections. Basal cisterns are patent. VASCULAR: Mode mild rate calcific atherosclerosis of the carotid siphons. SKULL: No skull fracture. Small RIGHT parietal scalp hematoma without subcutaneous gas or radiopaque foreign bodies. 18 mm midline suboccipital scalp lipoma. SINUSES/ORBITS: The mastoid air-cells and included paranasal sinuses are well-aerated.The included ocular globes and orbital contents are non-suspicious. OTHER: None. CT CERVICAL SPINE FINDINGS ALIGNMENT: Straightened lordosis. Vertebral bodies in alignment. SKULL BASE AND VERTEBRAE: Cervical vertebral bodies and posterior elements are intact. Moderate to severe C3-4 thru C7 eighth T1 disc height loss with endplate sclerosis and marginal spurring. Moderate C7-T1 facet arthropathy. No destructive bony lesions. C1-2 articulation maintained, severe arthrosis. SOFT TISSUES AND SPINAL CANAL: Nonacute. LEFT subclavian cardiac pacemaker wires. Mild carotid bifurcation calcifications. DISC LEVELS: No significant osseous canal stenosis or neural foraminal narrowing. UPPER CHEST: Lung apices are clear. OTHER: None. IMPRESSION: CT HEAD: 1. No acute intracranial process. Small RIGHT parietal scalp hematoma. No skull fracture. 2.  Otherwise negative noncontrast CT HEAD for age. CT CERVICAL SPINE: 1. No acute fracture or malalignment. Electronically Signed   By: Elon Alas M.D.   On: 07/29/2017 03:53   Ct Cervical Spine Wo Contrast  Result Date: 07/29/2017 CLINICAL DATA:  Syncopal episode after  getting up from seated position. Struck head on floor. Nausea. Pacemaker placed 3 days ago. History of hypertrophic cardiomyopathy, testicular cancer. EXAM: CT HEAD WITHOUT CONTRAST CT CERVICAL SPINE WITHOUT CONTRAST TECHNIQUE: Multidetector CT imaging of the head and cervical spine was performed following the standard protocol without intravenous contrast. Multiplanar CT image reconstructions of the cervical spine were also generated. COMPARISON:  None. FINDINGS: CT HEAD FINDINGS BRAIN: No intraparenchymal hemorrhage, mass effect nor midline shift. The ventricles and sulci are normal for age. Patchy supratentorial white matter hypodensities less than expected for patient's age, though non-specific are most compatible with chronic small vessel ischemic disease. No acute large vascular territory infarcts. No abnormal extra-axial fluid collections. Basal cisterns are patent. VASCULAR: Mode mild rate calcific atherosclerosis of the carotid siphons. SKULL: No skull fracture. Small RIGHT parietal scalp hematoma without subcutaneous gas or radiopaque foreign bodies. 18 mm midline suboccipital scalp lipoma. SINUSES/ORBITS: The mastoid air-cells and included paranasal sinuses are well-aerated.The included ocular globes and orbital contents are non-suspicious. OTHER: None. CT CERVICAL SPINE FINDINGS ALIGNMENT: Straightened lordosis. Vertebral bodies in alignment. SKULL BASE AND VERTEBRAE: Cervical vertebral bodies and posterior elements are intact. Moderate to severe C3-4 thru C7 eighth T1 disc height loss with endplate sclerosis and marginal spurring. Moderate C7-T1 facet arthropathy. No destructive bony lesions. C1-2 articulation maintained, severe  arthrosis. SOFT TISSUES AND SPINAL CANAL: Nonacute. LEFT subclavian cardiac pacemaker wires. Mild carotid bifurcation calcifications. DISC LEVELS: No significant osseous canal stenosis or neural foraminal narrowing. UPPER CHEST: Lung apices are clear. OTHER: None. IMPRESSION: CT HEAD: 1. No acute intracranial process. Small RIGHT parietal scalp hematoma. No skull fracture. 2. Otherwise negative noncontrast CT HEAD for age. CT CERVICAL SPINE: 1. No acute fracture or malalignment. Electronically Signed   By: Elon Alas M.D.   On: 07/29/2017 03:53   Dg Chest Port 1 View  Result Date: 07/25/2017 CLINICAL DATA:  71 year old male status post cardiopulmonary arrest and resuscitation including cardioversion, intubation EXAM: PORTABLE CHEST 1 VIEW COMPARISON:  Chest radiographs 03/24/2008. FINDINGS: Portable AP supine view at 1206 hours. Endotracheal tube tip in good position just below the level the clavicles. Enteric tube courses to the left upper quadrant, tip not included. Mildly low lung volumes. Mediastinal contours appear stable when allowing for portable technique. Mild streaky left perihilar and retrocardiac opacity. No pneumothorax, pulmonary edema, or definite effusion. No confluent right lung opacity. No displaced rib fracture or No acute osseous abnormality identified. IMPRESSION: 1. Endotracheal tube tip in good position. Enteric tube courses to the abdomen, tip not included. 2. Streaky left perihilar and lung base opacity is nonspecific, but favor atelectasis. 3. No other acute cardiopulmonary abnormality identified. Electronically Signed   By: Genevie Ann M.D.   On: 07/25/2017 12:51    EKG: 07/29/17 NSR, RBBB unchanged from 9/20 ECG. Not requiring pacing  Echo: 07/25/2017  - Left ventricle: The cavity size was normal. There was moderate concentric hypertrophy. Systolic function was vigorous. The estimated ejection fraction was in the range of 65% to 70%. There was no dynamic obstruction.  Hypokinesis of the basalinferoseptal myocardium. Doppler parameters are consistent with elevated mean left atrial filling pressure. - Aortic valve: Valve area (Vmax): 2.54 cm^2. - Mitral valve: There was mild to moderate regurgitation directed centrally. Diastolic regurgitation was present due to long AV delay. - Left atrium: The atrium was mildly dilated. - Right atrium: The atrium was mildly dilated. - Pericardium, extracardiac: A trivial pericardial effusion was identified.  Cardiac cath: 04/10/2017 1. Mild, non-obstructive coronary artery disease,  with 30% ostial RCA stenosis. 2. Significant dynamic LVOT gradient (10-15 mmHg at rest), which increased to 50-55 mmHg with Valsalva and 120 mmHg post-PVC. 3. Upper normal right heart filling pressure. 4. Normal PCWP and mildly elevated LVEDP. 5. Normal pulmonary artery pressure. 6. Normal Fick cardiac output/index.  Recommendations: 1. Medical therapy and risk factor modification for non-obstructive CAD. 2. Aggressive medical therapy for hypertrophic cardiomyopathy with dynamic LVOT obstruction. If symptoms persist despite optimal medical therapy, septal reduction procedure should be considered.   Medical decision making:  Discussed care with the patient Discussed care with the physician on the phone Reviewed labs and imaging personally Reviewed prior records  ASSESSMENT AND PLAN:  This is a 71 y.o. male with known history of OSA, HOCM s/p alcohol septal ablation c/b RBBB and cardiac arrest  Requiring MDT PPM presented with syncopal episode   Principal Problem:   Syncope Active Problems:   HOCM (hypertrophic obstructive cardiomyopathy) ([facility])   Elevated troponin   Depression   Other hyperlipidemia   Syncope and collapse   Syncope - likely orthostatic (Stokes adams), need to review prior echocardiogram to evaluate if there is residual gradient - pacemaker interrogation did not demonstrate any arrhytymia, will admit for  observation on telemetry bed, give IV fluids  HOCM s/p alcohol septal ablation - stable  Elevated troponin Likely related to recent cardiac arrest - cycle troponin  Hyperlipidemia - continue statin  Depression - continue home medications  OSA on CPAP  Signed, Flossie Dibble, MD MS 07/29/2017, 3:56 AM

## 2017-07-29 NOTE — ED Notes (Signed)
Taken to ct at this time. 

## 2017-07-29 NOTE — ED Notes (Signed)
Nurse collected labs. 

## 2017-07-29 NOTE — ED Notes (Signed)
oob to br with steady gait

## 2017-07-29 NOTE — ED Triage Notes (Addendum)
Pt. Was on the couch at home when got up and felt dizzy and the promply passed out. Pt. States not losing full consciousness. Pt. Had a pacemaker placed 3 days ago. Pt. Hit back of head on floor. Pt. Complains of nausea. Pt. States taking nightly dose of seroquil and having a glass of wine prior.

## 2017-07-29 NOTE — ED Notes (Signed)
Pt offered breakfast and declined

## 2017-07-30 DIAGNOSIS — I951 Orthostatic hypotension: Secondary | ICD-10-CM | POA: Diagnosis not present

## 2017-07-30 DIAGNOSIS — R55 Syncope and collapse: Secondary | ICD-10-CM

## 2017-07-30 DIAGNOSIS — R748 Abnormal levels of other serum enzymes: Secondary | ICD-10-CM | POA: Diagnosis not present

## 2017-07-30 DIAGNOSIS — I421 Obstructive hypertrophic cardiomyopathy: Secondary | ICD-10-CM | POA: Diagnosis not present

## 2017-07-30 NOTE — Progress Notes (Addendum)
Progress Note  Patient Name: Michael Barton Date of Encounter: 07/30/2017  Primary Cardiologist: Dr. Mertie Moores  Subjective   Reports no chest pain or palpitations, no dizziness. He ambulated in the hall yesterday.  Inpatient Medications    Scheduled Meds: . aspirin EC  81 mg Oral Daily  . buPROPion  100 mg Oral BID  . heparin  5,000 Units Subcutaneous Q8H  . lamoTRIgine  200 mg Oral Daily  . metoprolol succinate  100 mg Oral Daily  . pravastatin  40 mg Oral Daily  . QUEtiapine  200 mg Oral QHS  . testosterone  1.25 g Transdermal Daily  . venlafaxine XR  150 mg Oral Q breakfast    Vital Signs    Vitals:   07/29/17 1255 07/29/17 1303 07/29/17 1543 07/29/17 2101  BP: 133/76  101/64 (!) 151/105  Pulse: 60     Resp: 16   18  Temp: 98.6 F (37 C)   99.2 F (37.3 C)  TempSrc: Oral   Oral  SpO2: 98%   95%  Weight:  213 lb 6.4 oz (96.8 kg)    Height:  6\' 2"  (1.88 m)      Intake/Output Summary (Last 24 hours) at 07/30/17 0812 Last data filed at 07/29/17 2009  Gross per 24 hour  Intake              118 ml  Output              175 ml  Net              -57 ml   Filed Weights   07/29/17 0201 07/29/17 1303  Weight: 209 lb (94.8 kg) 213 lb 6.4 oz (96.8 kg)    Telemetry    Sinus rhythm. Personally reviewed.  ECG    Tracing from 07/29/2017 showed sinus rhythm with prolonged PR interval, right bundle branch block, diffuse nonspecific ST-T wave abnormalities. Personally reviewed.  Physical Exam   GEN: Overweight male. No acute distress.   Neck: No JVD. Cardiac: RRR, 2/6 systolic murmur, no gallop.  Respiratory: Nonlabored. Clear to auscultation bilaterally. GI: Soft, nontender, bowel sounds present. MS: No edema; No deformity. Neuro:  Nonfocal. Psych: Alert and oriented x 3. Normal affect.  Labs    Chemistry Recent Labs Lab 07/25/17 1145  07/26/17 0330 07/29/17 0221 07/29/17 0936  NA 132*  < > 132* 131* 133*  K 3.2*  < > 3.3* 4.1 3.8  CL 97*  <  > 98* 95* 98*  CO2 23  < > 28 28 28   GLUCOSE 173*  < > 124* 104* 89  BUN 14  < > 10 21* 16  CREATININE 1.60*  < > 1.23 1.44* 1.29*  CALCIUM 8.7*  < > 8.1* 8.5* 8.6*  PROT 6.7  --   --   --  5.9*  ALBUMIN 3.8  --   --   --  3.4*  AST 56*  --   --   --  23  ALT 49  --   --   --  24  ALKPHOS 60  --   --   --  62  BILITOT 0.7  --   --   --  0.4  GFRNONAA 42*  < > 58* 48* 55*  GFRAA 49*  < > >60 55* >60  ANIONGAP 12  < > 6 8 7   < > = values in this interval not displayed.   Hematology Recent Labs Lab 07/26/17 0330  07/29/17 0221 07/29/17 0936  WBC 5.9 6.3 5.2  RBC 3.82* 3.83* 3.85*  HGB 11.2* 11.3* 11.2*  HCT 32.4* 32.8* 33.2*  MCV 84.8 85.6 86.2  MCH 29.3 29.5 29.1  MCHC 34.6 34.5 33.7  RDW 12.9 13.2 13.0  PLT 218 201 240    Cardiac Enzymes Recent Labs Lab 07/26/17 0330 07/29/17 0936 07/29/17 1448 07/29/17 2128  TROPONINI 2.50* 1.17* 0.98* 1.16*    Recent Labs Lab 07/25/17 1206 07/29/17 0306  TROPIPOC 4.22* 0.63*     BNP Recent Labs Lab 07/29/17 0936  BNP 188.9*     Radiology    Dg Chest 2 View  Result Date: 07/29/2017 CLINICAL DATA:  Chest pain and dizziness tonight. Syncope. Pacemaker placed 3 days ago. EXAM: CHEST  2 VIEW COMPARISON:  07/25/2017 FINDINGS: Cardiac pacemaker with lead tips over the right atrium and right ventricle. No pneumothorax. Mild cardiac enlargement. No vascular congestion or edema. No focal consolidation. Mild atelectasis or scarring in the left lung base similar to previous study. IMPRESSION: Cardiac pacemaker appears in satisfactory position. No pneumothorax. Mild cardiac enlargement. Slight fibrosis or linear atelectasis in the left lung base. Electronically Signed   By: Lucienne Capers M.D.   On: 07/29/2017 04:45   Ct Head Wo Contrast  Result Date: 07/29/2017 CLINICAL DATA:  Syncopal episode after getting up from seated position. Struck head on floor. Nausea. Pacemaker placed 3 days ago. History of hypertrophic cardiomyopathy,  testicular cancer. EXAM: CT HEAD WITHOUT CONTRAST CT CERVICAL SPINE WITHOUT CONTRAST TECHNIQUE: Multidetector CT imaging of the head and cervical spine was performed following the standard protocol without intravenous contrast. Multiplanar CT image reconstructions of the cervical spine were also generated. COMPARISON:  None. FINDINGS: CT HEAD FINDINGS BRAIN: No intraparenchymal hemorrhage, mass effect nor midline shift. The ventricles and sulci are normal for age. Patchy supratentorial white matter hypodensities less than expected for patient's age, though non-specific are most compatible with chronic small vessel ischemic disease. No acute large vascular territory infarcts. No abnormal extra-axial fluid collections. Basal cisterns are patent. VASCULAR: Mode mild rate calcific atherosclerosis of the carotid siphons. SKULL: No skull fracture. Small RIGHT parietal scalp hematoma without subcutaneous gas or radiopaque foreign bodies. 18 mm midline suboccipital scalp lipoma. SINUSES/ORBITS: The mastoid air-cells and included paranasal sinuses are well-aerated.The included ocular globes and orbital contents are non-suspicious. OTHER: None. CT CERVICAL SPINE FINDINGS ALIGNMENT: Straightened lordosis. Vertebral bodies in alignment. SKULL BASE AND VERTEBRAE: Cervical vertebral bodies and posterior elements are intact. Moderate to severe C3-4 thru C7 eighth T1 disc height loss with endplate sclerosis and marginal spurring. Moderate C7-T1 facet arthropathy. No destructive bony lesions. C1-2 articulation maintained, severe arthrosis. SOFT TISSUES AND SPINAL CANAL: Nonacute. LEFT subclavian cardiac pacemaker wires. Mild carotid bifurcation calcifications. DISC LEVELS: No significant osseous canal stenosis or neural foraminal narrowing. UPPER CHEST: Lung apices are clear. OTHER: None. IMPRESSION: CT HEAD: 1. No acute intracranial process. Small RIGHT parietal scalp hematoma. No skull fracture. 2. Otherwise negative noncontrast  CT HEAD for age. CT CERVICAL SPINE: 1. No acute fracture or malalignment. Electronically Signed   By: Elon Alas M.D.   On: 07/29/2017 03:53   Ct Cervical Spine Wo Contrast  Result Date: 07/29/2017 CLINICAL DATA:  Syncopal episode after getting up from seated position. Struck head on floor. Nausea. Pacemaker placed 3 days ago. History of hypertrophic cardiomyopathy, testicular cancer. EXAM: CT HEAD WITHOUT CONTRAST CT CERVICAL SPINE WITHOUT CONTRAST TECHNIQUE: Multidetector CT imaging of the head and cervical spine was performed following the standard  protocol without intravenous contrast. Multiplanar CT image reconstructions of the cervical spine were also generated. COMPARISON:  None. FINDINGS: CT HEAD FINDINGS BRAIN: No intraparenchymal hemorrhage, mass effect nor midline shift. The ventricles and sulci are normal for age. Patchy supratentorial white matter hypodensities less than expected for patient's age, though non-specific are most compatible with chronic small vessel ischemic disease. No acute large vascular territory infarcts. No abnormal extra-axial fluid collections. Basal cisterns are patent. VASCULAR: Mode mild rate calcific atherosclerosis of the carotid siphons. SKULL: No skull fracture. Small RIGHT parietal scalp hematoma without subcutaneous gas or radiopaque foreign bodies. 18 mm midline suboccipital scalp lipoma. SINUSES/ORBITS: The mastoid air-cells and included paranasal sinuses are well-aerated.The included ocular globes and orbital contents are non-suspicious. OTHER: None. CT CERVICAL SPINE FINDINGS ALIGNMENT: Straightened lordosis. Vertebral bodies in alignment. SKULL BASE AND VERTEBRAE: Cervical vertebral bodies and posterior elements are intact. Moderate to severe C3-4 thru C7 eighth T1 disc height loss with endplate sclerosis and marginal spurring. Moderate C7-T1 facet arthropathy. No destructive bony lesions. C1-2 articulation maintained, severe arthrosis. SOFT TISSUES AND  SPINAL CANAL: Nonacute. LEFT subclavian cardiac pacemaker wires. Mild carotid bifurcation calcifications. DISC LEVELS: No significant osseous canal stenosis or neural foraminal narrowing. UPPER CHEST: Lung apices are clear. OTHER: None. IMPRESSION: CT HEAD: 1. No acute intracranial process. Small RIGHT parietal scalp hematoma. No skull fracture. 2. Otherwise negative noncontrast CT HEAD for age. CT CERVICAL SPINE: 1. No acute fracture or malalignment. Electronically Signed   By: Elon Alas M.D.   On: 07/29/2017 03:53    Cardiac Studies   Echocardiogram 07/25/2017: Study Conclusions  - Left ventricle: The cavity size was normal. There was moderate   concentric hypertrophy. Systolic function was vigorous. The   estimated ejection fraction was in the range of 65% to 70%. There   was no dynamic obstruction. Hypokinesis of the basalinferoseptal   myocardium. Doppler parameters are consistent with elevated mean   left atrial filling pressure. - Aortic valve: Valve area (Vmax): 2.54 cm^2. - Mitral valve: There was mild to moderate regurgitation directed   centrally. Diastolic regurgitation was present due to long AV   delay. - Left atrium: The atrium was mildly dilated. - Right atrium: The atrium was mildly dilated. - Pericardium, extracardiac: A trivial pericardial effusion was   identified.  Patient Profile     71 y.o. male with a history of hypertrophic obstructive cardiomyopathy status post alcohol septal ablation at [facility] hyperlipidemia, sleep apnea,and recent placement of [employer] dual-chamber pacemaker by Dr. Lovena Le in the setting of symptomatic intermittent complete heart block. He was just recently discharged home, return to the hospital after episode of orthostatic syncope. He was admitted for observation and hydration.  Assessment & Plan    1. Orthostatic syncope. Patient feels better after IV fluids. We discussed general hydration parameters particularly with his history  HOCM, even though he has had an alcohol septal ablation. Recent echocardiogram showed LVEF 65-70% with no dynamic obstruction. He had no arrhythmias documented on monitor overnight.  2. Hypertrophic obstructive cardiomyopathy status post alcohol septal ablation at [facility]. He has followed with Dr. Mina Marble.  3. Recent symptomatic intermittent complete heart block status post Medtronic dual chamber pacemaker by Dr. Lovena Le. Device function normal and no arrhythmias documented on overnight monitor.  4. Elevated troponin I, downward trend compared to most recent hospital stay and not felt to represent ACS at this time.  Patient states that he feels better today and would like to go home. I have asked him to  walk in the halls this morning, and if no further symptoms we will plan discharge home. He has a visit scheduled tomorrow to see Dr. Acie Fredrickson in the office. Continue with present outpatient cardiac regimen including aspirin, Toprol-XL, and Pravachol. We discussed general hydration parameters.  Signed, Rozann Lesches, MD  07/30/2017, 8:12 AM

## 2017-07-30 NOTE — Discharge Summary (Signed)
Discharge Summary    Patient ID: Michael Barton,  MRN: [id number], DOB/AGE: [birth year] 71 y.o.  Admit date: 07/29/2017 Discharge date: 07/30/2017  Primary Care Provider: Gaynelle Arabian Primary Cardiologist: Dr, Acie Fredrickson  Discharge Diagnoses    Principal Problem:   Syncope Active Problems:   HOCM (hypertrophic obstructive cardiomyopathy) (HCC)   Elevated troponin   Depression   Other hyperlipidemia   Syncope and collapse   Allergies Allergies  Allergen Reactions  . Ambien [Zolpidem Tartrate]      GERD   . Ambien [Zolpidem Tartrate] Other (See Comments)    GERD  . Prozac [Fluoxetine Hcl]     Gives a weird feeling   . Prozac [Fluoxetine] Other (See Comments)    "makes me feel weird"    Diagnostic Studies/Procedures    None _____________   History of Present Illness     Michael Barton is a 71 y.o. male with significant history of HOCM with recent alcohol septal ablation on 9/12 and post procedure complication of RBBB who developed cardiac arrest requiring Medtronic PPM implantation on 9/19 at [facility] by Dr. Lovena Le. He presented on 07/29/17 with syncope.  Patient was laying down in the couch. After an hour he stood up and walked for a couple of steps when felt dizzy and fell back and hit his head. He did not lose consciousness. He had associated nausea. He did take Seroquel and a glass of wine prior to this episode.  Of note, patient initially presented with HOCM to Dr. Acie Fredrickson in 2017. In June 2018 he presented with progressive dyspnea and near syncope on beta blocker. He underwent cardiac cath which showed minimal nonobstructive CAD, LVOT gradient of 55 mm Hg with post PVC gradient of 120 mm Hg. He was referred to Dr. Mina Marble in [place] who performed alcohol septal ablation on 9/12.  He was doing well post procedurally until when he developed cardiac arrest. Recent echocardiogram on 07/25/17 showed LVEF 65-70% with no dynamic obstruction.   Hospital Course       Consultants: None  The patient was admitted with probable orthostatic syncope and was hydrated gently.   1. Orthostatic syncope. Patient feels better after IV fluids. We discussed general hydration parameters particularly with his history HOCM, even though he has had an alcohol septal ablation. Recent echocardiogram showed LVEF 65-70% with no dynamic obstruction. He had no arrhythmias documented on monitor overnight.  2. Hypertrophic obstructive cardiomyopathy status post alcohol septal ablation at [facility]. He has followed with Dr. Mina Marble.  3. Recent symptomatic intermittent complete heart block status post Medtronic dual chamber pacemaker by Dr. Lovena Le. Device function normal and no arrhythmias documented on overnight monitor.  4. Elevated troponin I, downward trend compared to most recent hospital stay and not felt to represent ACS at this time.  Per Dr. Domenic Polite: Patient states that he feels better today and would like to go home. The patient has walked in the halls this morning without any issues. He has a visit scheduled tomorrow to see Dr. Acie Fredrickson in the office. Continue with present outpatient cardiac regimen including aspirin, Toprol-XL, and Pravachol. We discussed general hydration parameters. _____________  Discharge Vitals Blood pressure (!) 148/84, pulse 80, temperature 99.2 F (37.3 C), temperature source Oral, resp. rate 18, height 6\' 2"  (1.88 m), weight 213 lb 6.4 oz (96.8 kg), SpO2 95 %.  Filed Weights   07/29/17 0201 07/29/17 1303  Weight: 209 lb (94.8 kg) 213 lb 6.4 oz (96.8 kg)    Labs &  Radiologic Studies    CBC  Recent Labs  07/29/17 0221 07/29/17 0936  WBC 6.3 5.2  NEUTROABS 3.7  --   HGB 11.3* 11.2*  HCT 32.8* 33.2*  MCV 85.6 86.2  PLT 201 025   Basic Metabolic Panel  Recent Labs  07/29/17 0221 07/29/17 0936  NA 131* 133*  K 4.1 3.8  CL 95* 98*  CO2 28 28  GLUCOSE 104* 89  BUN 21* 16  CREATININE 1.44* 1.29*  CALCIUM 8.5* 8.6*   Liver  Function Tests  Recent Labs  07/29/17 0936  AST 23  ALT 24  ALKPHOS 62  BILITOT 0.4  PROT 5.9*  ALBUMIN 3.4*   No results for input(s): LIPASE, AMYLASE in the last 72 hours. Cardiac Enzymes  Recent Labs  07/29/17 0936 07/29/17 1448 07/29/17 2128  TROPONINI 1.17* 0.98* 1.16*   BNP Invalid input(s): POCBNP D-Dimer No results for input(s): DDIMER in the last 72 hours. Hemoglobin A1C  Recent Labs  07/29/17 0923  HGBA1C 5.5   Fasting Lipid Panel No results for input(s): CHOL, HDL, LDLCALC, TRIG, CHOLHDL, LDLDIRECT in the last 72 hours. Thyroid Function Tests  Recent Labs  07/29/17 0936  TSH 2.904   _____________  Dg Chest 2 View  Result Date: 07/29/2017 CLINICAL DATA:  Chest pain and dizziness tonight. Syncope. Pacemaker placed 3 days ago. EXAM: CHEST  2 VIEW COMPARISON:  07/25/2017 FINDINGS: Cardiac pacemaker with lead tips over the right atrium and right ventricle. No pneumothorax. Mild cardiac enlargement. No vascular congestion or edema. No focal consolidation. Mild atelectasis or scarring in the left lung base similar to previous study. IMPRESSION: Cardiac pacemaker appears in satisfactory position. No pneumothorax. Mild cardiac enlargement. Slight fibrosis or linear atelectasis in the left lung base. Electronically Signed   By: Lucienne Capers M.D.   On: 07/29/2017 04:45   Dg Chest 2 View  Result Date: 07/27/2017 CLINICAL DATA:  Pacemaker placement. EXAM: CHEST  2 VIEW COMPARISON:  No prior. FINDINGS: Cardiac pacer with lead tips in right atrium right ventricle. Cardiomegaly. Normal pulmonary vascularity. Mild left base atelectasis. Left base pleural thickening, small effusion cannot be excluded. No pneumothorax. IMPRESSION: 1. Cardiac pacer with lead tips in right atrium and right ventricle. Cardiomegaly. No pulmonary venous congestion. 2. Low lung volumes. Mild left base atelectasis. Small left pleural effusion cannot be excluded. Electronically Signed   By:  Marcello Moores  Register   On: 07/27/2017 07:53   Ct Head Wo Contrast  Result Date: 07/29/2017 CLINICAL DATA:  Syncopal episode after getting up from seated position. Struck head on floor. Nausea. Pacemaker placed 3 days ago. History of hypertrophic cardiomyopathy, testicular cancer. EXAM: CT HEAD WITHOUT CONTRAST CT CERVICAL SPINE WITHOUT CONTRAST TECHNIQUE: Multidetector CT imaging of the head and cervical spine was performed following the standard protocol without intravenous contrast. Multiplanar CT image reconstructions of the cervical spine were also generated. COMPARISON:  None. FINDINGS: CT HEAD FINDINGS BRAIN: No intraparenchymal hemorrhage, mass effect nor midline shift. The ventricles and sulci are normal for age. Patchy supratentorial white matter hypodensities less than expected for patient's age, though non-specific are most compatible with chronic small vessel ischemic disease. No acute large vascular territory infarcts. No abnormal extra-axial fluid collections. Basal cisterns are patent. VASCULAR: Mode mild rate calcific atherosclerosis of the carotid siphons. SKULL: No skull fracture. Small RIGHT parietal scalp hematoma without subcutaneous gas or radiopaque foreign bodies. 18 mm midline suboccipital scalp lipoma. SINUSES/ORBITS: The mastoid air-cells and included paranasal sinuses are well-aerated.The included ocular globes and orbital  contents are non-suspicious. OTHER: None. CT CERVICAL SPINE FINDINGS ALIGNMENT: Straightened lordosis. Vertebral bodies in alignment. SKULL BASE AND VERTEBRAE: Cervical vertebral bodies and posterior elements are intact. Moderate to severe C3-4 thru C7 eighth T1 disc height loss with endplate sclerosis and marginal spurring. Moderate C7-T1 facet arthropathy. No destructive bony lesions. C1-2 articulation maintained, severe arthrosis. SOFT TISSUES AND SPINAL CANAL: Nonacute. LEFT subclavian cardiac pacemaker wires. Mild carotid bifurcation calcifications. DISC LEVELS: No  significant osseous canal stenosis or neural foraminal narrowing. UPPER CHEST: Lung apices are clear. OTHER: None. IMPRESSION: CT HEAD: 1. No acute intracranial process. Small RIGHT parietal scalp hematoma. No skull fracture. 2. Otherwise negative noncontrast CT HEAD for age. CT CERVICAL SPINE: 1. No acute fracture or malalignment. Electronically Signed   By: Elon Alas M.D.   On: 07/29/2017 03:53   Ct Cervical Spine Wo Contrast  Result Date: 07/29/2017 CLINICAL DATA:  Syncopal episode after getting up from seated position. Struck head on floor. Nausea. Pacemaker placed 3 days ago. History of hypertrophic cardiomyopathy, testicular cancer. EXAM: CT HEAD WITHOUT CONTRAST CT CERVICAL SPINE WITHOUT CONTRAST TECHNIQUE: Multidetector CT imaging of the head and cervical spine was performed following the standard protocol without intravenous contrast. Multiplanar CT image reconstructions of the cervical spine were also generated. COMPARISON:  None. FINDINGS: CT HEAD FINDINGS BRAIN: No intraparenchymal hemorrhage, mass effect nor midline shift. The ventricles and sulci are normal for age. Patchy supratentorial white matter hypodensities less than expected for patient's age, though non-specific are most compatible with chronic small vessel ischemic disease. No acute large vascular territory infarcts. No abnormal extra-axial fluid collections. Basal cisterns are patent. VASCULAR: Mode mild rate calcific atherosclerosis of the carotid siphons. SKULL: No skull fracture. Small RIGHT parietal scalp hematoma without subcutaneous gas or radiopaque foreign bodies. 18 mm midline suboccipital scalp lipoma. SINUSES/ORBITS: The mastoid air-cells and included paranasal sinuses are well-aerated.The included ocular globes and orbital contents are non-suspicious. OTHER: None. CT CERVICAL SPINE FINDINGS ALIGNMENT: Straightened lordosis. Vertebral bodies in alignment. SKULL BASE AND VERTEBRAE: Cervical vertebral bodies and  posterior elements are intact. Moderate to severe C3-4 thru C7 eighth T1 disc height loss with endplate sclerosis and marginal spurring. Moderate C7-T1 facet arthropathy. No destructive bony lesions. C1-2 articulation maintained, severe arthrosis. SOFT TISSUES AND SPINAL CANAL: Nonacute. LEFT subclavian cardiac pacemaker wires. Mild carotid bifurcation calcifications. DISC LEVELS: No significant osseous canal stenosis or neural foraminal narrowing. UPPER CHEST: Lung apices are clear. OTHER: None. IMPRESSION: CT HEAD: 1. No acute intracranial process. Small RIGHT parietal scalp hematoma. No skull fracture. 2. Otherwise negative noncontrast CT HEAD for age. CT CERVICAL SPINE: 1. No acute fracture or malalignment. Electronically Signed   By: Elon Alas M.D.   On: 07/29/2017 03:53   Dg Chest Port 1 View  Result Date: 07/25/2017 CLINICAL DATA:  71 year old male status post cardiopulmonary arrest and resuscitation including cardioversion, intubation EXAM: PORTABLE CHEST 1 VIEW COMPARISON:  Chest radiographs 03/24/2008. FINDINGS: Portable AP supine view at 1206 hours. Endotracheal tube tip in good position just below the level the clavicles. Enteric tube courses to the left upper quadrant, tip not included. Mildly low lung volumes. Mediastinal contours appear stable when allowing for portable technique. Mild streaky left perihilar and retrocardiac opacity. No pneumothorax, pulmonary edema, or definite effusion. No confluent right lung opacity. No displaced rib fracture or No acute osseous abnormality identified. IMPRESSION: 1. Endotracheal tube tip in good position. Enteric tube courses to the abdomen, tip not included. 2. Streaky left perihilar and lung base opacity is  nonspecific, but favor atelectasis. 3. No other acute cardiopulmonary abnormality identified. Electronically Signed   By: Genevie Ann M.D.   On: 07/25/2017 12:51   Disposition   Pt is being discharged home today in good condition.  Follow-up  Plans & Appointments    Follow-up Information    Nahser, Wonda Cheng, MD Follow up.   Specialty:  Cardiology Why:  Keep your appointment with Dr. Acie Fredrickson on Monday 07/31/17 at 1:40.  Contact information: [address] [address] [phone]          Discharge Instructions    Diet - low sodium heart healthy    Complete by:  As directed    Increase activity slowly    Complete by:  As directed       Discharge Medications   Current Discharge Medication List    CONTINUE these medications which have NOT CHANGED   Details  aspirin EC 81 MG tablet Take 1 tablet (81 mg total) by mouth daily.    buPROPion (WELLBUTRIN SR) 100 MG 12 hr tablet Take 100 mg by mouth 2 (two) times daily.    lamoTRIgine (LAMICTAL) 200 MG tablet Take 200 mg by mouth daily.    metoprolol succinate (TOPROL-XL) 100 MG 24 hr tablet Take 100 mg by mouth daily. Take with or immediately following a meal.    pravastatin (PRAVACHOL) 40 MG tablet Take 40 mg by mouth daily.    QUEtiapine (SEROQUEL) 200 MG tablet Take 200 mg by mouth at bedtime.    testosterone (ANDROGEL) 50 MG/5GM (1%) GEL Place 1.25 g onto the skin daily. to each shoulder    venlafaxine XR (EFFEXOR-XR) 150 MG 24 hr capsule Take 150 mg by mouth daily with breakfast.           Outstanding Labs/Studies   None  Duration of Discharge Encounter   Greater than 30 minutes including physician time.  Signed, Daune Perch NP 07/30/2017, 9:28 AM

## 2017-07-30 NOTE — Discharge Instructions (Signed)
Orthostatic Hypotension Orthostatic hypotension is a sudden drop in blood pressure that happens when you quickly change positions, such as when you get up from a seated or lying position. Blood pressure is a measurement of how strongly, or weakly, your blood is pressing against the walls of your arteries. Arteries are blood vessels that carry blood from your heart throughout your body. When blood pressure is too low, you may not get enough blood to your brain or to the rest of your organs. This can cause weakness, light-headedness, rapid heartbeat, and fainting. This can last for just a few seconds or for up to a few minutes. Orthostatic hypotension is usually not a serious problem. However, if it happens frequently or gets worse, it may be a sign of something more serious. What are the causes? This condition may be caused by:  Sudden changes in posture, such as standing up quickly after you have been sitting or lying down.  Blood loss.  Loss of body fluids (dehydration).  Heart problems.  Hormone (endocrine) problems.  Pregnancy.  Severe infection.  Lack of certain nutrients.  Severe allergic reactions (anaphylaxis).  Certain medicines, such as blood pressure medicine or medicines that make the body lose excess fluids (diuretics). Sometimes, this condition can be caused by not taking medicine as directed, such as taking too much of a certain medicine.  What increases the risk? Certain factors can make you more likely to develop orthostatic hypotension, including:  Age. Risk increases as you get older.  Conditions that affect the heart or the central nervous system.  Taking certain medicines, such as blood pressure medicine or diuretics.  Being pregnant.  What are the signs or symptoms? Symptoms of this condition may include:  Weakness.  Light-headedness.  Dizziness.  Blurred vision.  Fatigue.  Rapid heartbeat.  Fainting, in severe cases.  How is this  diagnosed? This condition is diagnosed based on:  Your medical history.  Your symptoms.  Your blood pressure measurement. Your health care provider will check your blood pressure when you are: ? Lying down. ? Sitting. ? Standing.  A blood pressure reading is recorded as two numbers, such as "120 over 80" (or 120/80). The first ("top") number is called the systolic pressure. It is a measure of the pressure in your arteries as your heart beats. The second ("bottom") number is called the diastolic pressure. It is a measure of the pressure in your arteries when your heart relaxes between beats. Blood pressure is measured in a unit called mm Hg. Healthy blood pressure for adults is 120/80. If your blood pressure is below 90/60, you may be diagnosed with hypotension. Other information or tests that may be used to diagnose orthostatic hypotension include:  Your other vital signs, such as your heart rate and temperature.  Blood tests.  Tilt table test. For this test, you will be safely secured to a table that moves you from a lying position to an upright position. Your heart rhythm and blood pressure will be monitored during the test.  How is this treated? Treatment for this condition may include:  Changing your diet. This may involve eating more salt (sodium) or drinking more water.  Taking medicines to raise your blood pressure.  Changing the dosage of certain medicines you are taking that might be lowering your blood pressure.  Wearing compression stockings. These stockings help to prevent blood clots and reduce swelling in your legs.  In some cases, you may need to go to the hospital for:    Fluid replacement. This means you will receive fluids through an IV tube.  Blood replacement. This means you will receive donated blood through an IV tube (transfusion).  Treating an infection or heart problems, if this applies.  Monitoring. You may need to be monitored while medicines that you  are taking wear off.  Follow these instructions at home: Eating and drinking   Drink enough fluid to keep your urine clear or pale yellow.  Eat a healthy diet and follow instructions from your health care provider about eating or drinking restrictions. A healthy diet includes: ? Fresh fruits and vegetables. ? Whole grains. ? Lean meats. ? Low-fat dairy products.  Eat extra salt only as directed. Do not add extra salt to your diet unless your health care provider told you to do that.  Eat frequent, small meals.  Avoid standing up suddenly after eating. Medicines  Take over-the-counter and prescription medicines only as told by your health care provider. ? Follow instructions from your health care provider about changing the dosage of your current medicines, if this applies. ? Do not stop or adjust any of your medicines on your own. General instructions  Wear compression stockings as told by your health care provider.  Get up slowly from lying down or sitting positions. This gives your blood pressure a chance to adjust.  Avoid hot showers and excessive heat as directed by your health care provider.  Return to your normal activities as told by your health care provider. Ask your health care provider what activities are safe for you.  Do not use any products that contain nicotine or tobacco, such as cigarettes and e-cigarettes. If you need help quitting, ask your health care provider.  Keep all follow-up visits as told by your health care provider. This is important. Contact a health care provider if:  You vomit.  You have diarrhea.  You have a fever for more than 2-3 days.  You feel more thirsty than usual.  You feel weak and tired. Get help right away if:  You have chest pain.  You have a fast or irregular heartbeat.  You develop numbness in any part of your body.  You cannot move your arms or your legs.  You have trouble speaking.  You become sweaty or feel  lightheaded.  You faint.  You feel short of breath.  You have trouble staying awake.  You feel confused. This information is not intended to replace advice given to you by your health care provider. Make sure you discuss any questions you have with your health care provider. Document Released: 10/14/2002 Document Revised: 07/12/2016 Document Reviewed: 04/15/2016 Elsevier Interactive Patient Education  2018 [employer]  

## 2017-07-31 ENCOUNTER — Ambulatory Visit: Payer: Medicare Other | Admitting: Cardiovascular Disease

## 2017-08-01 DIAGNOSIS — E7800 Pure hypercholesterolemia, unspecified: Secondary | ICD-10-CM | POA: Diagnosis not present

## 2017-08-01 DIAGNOSIS — G4733 Obstructive sleep apnea (adult) (pediatric): Secondary | ICD-10-CM | POA: Diagnosis not present

## 2017-08-01 DIAGNOSIS — I421 Obstructive hypertrophic cardiomyopathy: Secondary | ICD-10-CM | POA: Diagnosis not present

## 2017-08-01 DIAGNOSIS — E291 Testicular hypofunction: Secondary | ICD-10-CM | POA: Diagnosis not present

## 2017-08-01 DIAGNOSIS — N183 Chronic kidney disease, stage 3 (moderate): Secondary | ICD-10-CM | POA: Diagnosis not present

## 2017-08-01 DIAGNOSIS — Z23 Encounter for immunization: Secondary | ICD-10-CM | POA: Diagnosis not present

## 2017-08-01 DIAGNOSIS — F322 Major depressive disorder, single episode, severe without psychotic features: Secondary | ICD-10-CM | POA: Diagnosis not present

## 2017-08-03 ENCOUNTER — Ambulatory Visit (INDEPENDENT_AMBULATORY_CARE_PROVIDER_SITE_OTHER): Payer: Medicare Other | Admitting: Cardiovascular Disease

## 2017-08-03 ENCOUNTER — Encounter: Payer: Self-pay | Admitting: Cardiovascular Disease

## 2017-08-03 ENCOUNTER — Ambulatory Visit (INDEPENDENT_AMBULATORY_CARE_PROVIDER_SITE_OTHER): Payer: Medicare Other | Admitting: *Deleted

## 2017-08-03 VITALS — BP 138/78 | HR 63 | Ht 74.0 in | Wt 214.6 lb

## 2017-08-03 DIAGNOSIS — I421 Obstructive hypertrophic cardiomyopathy: Secondary | ICD-10-CM | POA: Diagnosis not present

## 2017-08-03 DIAGNOSIS — I442 Atrioventricular block, complete: Secondary | ICD-10-CM | POA: Diagnosis not present

## 2017-08-03 DIAGNOSIS — I951 Orthostatic hypotension: Secondary | ICD-10-CM | POA: Diagnosis not present

## 2017-08-03 DIAGNOSIS — Z950 Presence of cardiac pacemaker: Secondary | ICD-10-CM | POA: Diagnosis not present

## 2017-08-03 NOTE — Patient Instructions (Addendum)
Medication Instructions:  Your physician recommends that you continue on your current medications as directed. Please refer to the Current Medication list given to you today.   Labwork: None Ordered   Testing/Procedures: None Ordered   Follow-Up: Your physician recommends that you keep your follow-up appointment for November 21 with Dr. Acie Fredrickson   If you need a refill on your cardiac medications before your next appointment, please call your pharmacy.   Thank you for choosing [facility]! Christen Bame, RN [phone]

## 2017-08-03 NOTE — Progress Notes (Signed)
Cardiology Office Note   Date:  08/03/2017   ID:  Michael Barton, DOB 02/11/1946, MRN [id number]  PCP:  Gaynelle Arabian, MD  Cardiologist:   Mertie Moores, MD   Chief Complaint  Patient presents with  . Cardiomyopathy  . Loss of Consciousness   1. Shortness of breath 2. Hyperlipidemia 3. Obstructive sleep apnea 4. Depression    History of Present Illness: Michael Barton is a 71 y.o. male who presents for DOE Was an avid runner and cyclist until 5 years ago. Is not able to run or cycle recently - has been a steady decline  He can now run for 1 minute and then walk for 2-3   He has noticed a definint decline in his abiltiy - now gets short of breath doing everyday activities. He become short of breath climbing stairs and walking from his office out to the car.  He denies any chest pain. He has no shortness of breath at rest. He's had an echo card gram the past which reveals left ventricular hypertrophy.  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 A stress Myoview study showed no ischemia. His ejection fraction was 58%.  Michael Barton has not improved.   Has not had his echo yet.   Aug. 18, 2017: Michael Barton is seen for his dyspnea with exertion .    Myoview Mar 28, 2016 - no ischemia.  EF 58% Echo - June 2017:   Normal LV dysfunction .   Mild dynamic LV obstruction   Cardiopulmonary stress test:    Overall normal function capacity  He has been started on Coreg - has been increased to 6.25 BID   Can walk and cycle witout problems Has DOE with running    Nov. 27, 2017:  Michael Barton Is seen back for follow-up office visit. He had an echocardiogram which reveals a  Slight dynamic obstruction with LVH.   We increased his Coreg to 6.25 BID , And he has not noticed any significant improvement in his exertional dyspnea.  Feb. 26,  2018  Michael Barton is slightly worse. Has some symptoms or  orthostasis.  The change from Coreg to Metoprolol did not help  Still hiking - especially in the warmer months   March 01, 2017:   Michael Barton is seen back today  He had stopped the metoprolol prior to getting the MRI.  Still cant walk very far without getting severe shortness of breath .    Sept. 27, 2018:  Michael Barton is seen today for follow up  He has had an alcohol septal ablation at [facility] for his HOCM.  He had an episode of syncope the week after the ablation  And was found to have  complete AV block    He had a pacer placed, he did well but had a brief episode of syncope and was re-hospitalized .   Was found to have orthostasis .   Is feeling much better.   No further episodes of syncope  Is here for wound check and pacer check and to see me for follow up.   Past Medical History:  Diagnosis Date  . Depression   . History of orchiectomy   . HOCM (hypertrophic obstructive cardiomyopathy) ([place]) 06/24/2016   cMRI 3/18: Severe asymm septal hypertrophy, EF 74, SAM, mod MR, turb flow sugg of significant LVOT gradient, picture c/w HOCM // Echo 6/17: Mild conc  LVH, LVOT gradient 14, EF 65-70, mild MR // CP stress test 6/17: NL functional capacity compared to matched sedentary norms. Pt primarily limited by body habitus and deconditioning // Echo 9/12: EF 69.8, LVOT gradient 16   . HOCM (hypertrophic obstructive cardiomyopathy) ([place])   . Hyperlipidemia   . Sleep apnea   . Testicular cancer [facility])     Past Surgical History:  Procedure Laterality Date  . ORCHIECTOMY    . PACEMAKER IMPLANT N/A 07/26/2017   Procedure: Pacemaker Implant;  Surgeon: Evans Lance, MD;  Location: [place] [facility];  Service: Cardiovascular;  Laterality: N/A;  . RIGHT/LEFT HEART CATH AND CORONARY ANGIOGRAPHY N/A 04/10/2017   Procedure: Right/Left Heart Cath and Coronary Angiography;  Surgeon: Nelva Bush, MD;  Location: [place] [facility];  Service: Cardiovascular;  Laterality: N/A;  . TEMPORARY PACEMAKER N/A  07/25/2017   Procedure: Temporary Pacemaker;  Surgeon: Sherren Mocha, MD;  Location: [place] [facility];  Service: Cardiovascular;  Laterality: N/A;     Current Outpatient Prescriptions  Medication Sig Dispense Refill  . aspirin EC 81 MG tablet Take 1 tablet (81 mg total) by mouth daily.    Marland Kitchen buPROPion (WELLBUTRIN SR) 100 MG 12 hr tablet Take 100 mg by mouth 2 (two) times daily.    Marland Kitchen lamoTRIgine (LAMICTAL) 200 MG tablet Take 200 mg by mouth daily.    . metoprolol succinate (TOPROL-XL) 100 MG 24 hr tablet Take 100 mg by mouth daily. Take with or immediately following a meal.    . pravastatin (PRAVACHOL) 40 MG tablet Take 40 mg by mouth daily.    . QUEtiapine (SEROQUEL) 200 MG tablet Take 200 mg by mouth at bedtime.    Marland Kitchen testosterone (ANDROGEL) 50 MG/5GM (1%) GEL Place 1.25 g onto the skin daily. to each shoulder    . venlafaxine XR (EFFEXOR-XR) 150 MG 24 hr capsule Take 150 mg by mouth daily with breakfast.     No current facility-administered medications for this visit.     Allergies:   Ambien [zolpidem tartrate]; Ambien [zolpidem tartrate]; Prozac [fluoxetine hcl]; and Prozac [fluoxetine]    Social History:  The patient  reports that he has never smoked. He has never used smokeless tobacco. He reports that he does not drink alcohol or use drugs.   Family History:  The patient's family history includes Cancer in his father; Depression in his mother; Thyroid disease in his mother.    ROS:  Please see the history of present illness.    Review of Systems: Constitutional:  denies fever, chills, diaphoresis, appetite change and fatigue.  HEENT: denies photophobia, eye pain, redness, hearing loss, ear pain, congestion, sore throat, rhinorrhea, sneezing, neck pain, neck stiffness and tinnitus.  Respiratory: admits to SOB, DOE,    Denies cough, chest tightness, and wheezing.  Cardiovascular: denies chest pain, palpitations and leg swelling.  Gastrointestinal: denies nausea, vomiting,  abdominal pain, diarrhea, constipation, blood in stool.  Genitourinary: denies dysuria, urgency, frequency, hematuria, flank pain and difficulty urinating.  Musculoskeletal: denies  myalgias, back pain, joint swelling, arthralgias and gait problem.   Skin: denies pallor, rash and wound.  Neurological: denies dizziness, seizures, syncope, weakness, light-headedness, numbness and headaches.   Hematological: denies adenopathy, easy bruising, personal or family bleeding history.  Psychiatric/ Behavioral: denies suicidal ideation, mood changes, confusion, nervousness, sleep disturbance and agitation.       All other systems are reviewed and negative.   Physical Exam: Blood pressure 138/78, pulse 63, height 6\' 2"  (1.88 m), weight  214 lb 9.6 oz (97.3 kg), SpO2 95 %.  GEN:  Well nourished, well developed in no acute distress HEENT: Normal NECK: No JVD; No carotid bruits LYMPHATICS: No lymphadenopathy CARDIAC: RR, soft systolic murmur, no rubs  RESPIRATORY:  Clear to auscultation without rales, wheezing or rhonchi  ABDOMEN: Soft, non-tender, non-distended MUSCULOSKELETAL:  No edema; No deformity  SKIN: Warm and dry NEUROLOGIC:  Alert and oriented x 3      EKG:  EKG is ordered today.  NSR at 63 with 1st degree AV block   Recent Labs: 07/26/2017: Magnesium 2.2 07/29/2017: ALT 24; B Natriuretic Peptide 188.9; BUN 16; Creatinine, Ser 1.29; Hemoglobin 11.2; Platelets 240; Potassium 3.8; Sodium 133; TSH 2.904    Lipid Panel    Component Value Date/Time   CHOL 157 07/25/2017 1145   TRIG 85 07/25/2017 1145   HDL 53 07/25/2017 1145   CHOLHDL 3.0 07/25/2017 1145   VLDL 17 07/25/2017 1145   LDLCALC 87 07/25/2017 1145      Wt Readings from Last 3 Encounters:  08/03/17 214 lb 9.6 oz (97.3 kg)  07/29/17 213 lb 6.4 oz (96.8 kg)  07/27/17 209 lb (94.8 kg)      Other studies Reviewed: Additional studies/ records that were reviewed today include: . Review of the above records  demonstrates:    ASSESSMENT AND PLAN:  1.  Dynamic left ventricular outflow tract obstruction -   Now s/p ETOH ablation Repeat echocardiogram several weeks ago revealed no dynamic LVOT obstruction We'll continue with current medications. I'll see him in the office in several months.  2. Complete heart block: Now status post pacer. He's not had any further episodes of syncope   3.   Orthostatic hypotension :   Drinking more fluids. Feels well.    I'll see him in several months.  Current medicines are reviewed at length with the patient today.  The patient does not have concerns regarding medicines.  The following changes have been made:  no change  Labs/ tests ordered today include:   No orders of the defined types were placed in this encounter.   Mertie Moores, MD  08/03/2017 5:03 PM    [facility] [place], [place], [place]  [address] Phone: [phone]; Fax: [phone]

## 2017-08-04 LAB — CUP PACEART INCLINIC DEVICE CHECK
Battery Remaining Longevity: 178 mo
Battery Voltage: 3.22 V
Brady Statistic AS VS Percent: 85.65 %
Brady Statistic RA Percent Paced: 14.34 %
Date Time Interrogation Session: 20180927205104
Implantable Lead Implant Date: 20180919
Implantable Lead Location: 753860
Implantable Lead Model: 5076
Implantable Lead Model: 5076
Implantable Pulse Generator Implant Date: 20180919
Lead Channel Impedance Value: 266 Ohm
Lead Channel Impedance Value: 418 Ohm
Lead Channel Pacing Threshold Amplitude: 0.5 V
Lead Channel Pacing Threshold Amplitude: 1 V
Lead Channel Pacing Threshold Pulse Width: 0.4 ms
Lead Channel Sensing Intrinsic Amplitude: 1.75 mV
Lead Channel Setting Pacing Amplitude: 3.5 V
Lead Channel Setting Pacing Pulse Width: 0.4 ms
MDC IDC LEAD IMPLANT DT: 20180919
MDC IDC LEAD LOCATION: 753859
MDC IDC MSMT LEADCHNL RA IMPEDANCE VALUE: 361 Ohm
MDC IDC MSMT LEADCHNL RA PACING THRESHOLD PULSEWIDTH: 0.4 ms
MDC IDC MSMT LEADCHNL RV IMPEDANCE VALUE: 342 Ohm
MDC IDC MSMT LEADCHNL RV SENSING INTR AMPL: 7.25 mV
MDC IDC SET LEADCHNL RA PACING AMPLITUDE: 3.5 V
MDC IDC SET LEADCHNL RV SENSING SENSITIVITY: 1.2 mV
MDC IDC STAT BRADY AP VP PERCENT: 0.01 %
MDC IDC STAT BRADY AP VS PERCENT: 14.28 %
MDC IDC STAT BRADY AS VP PERCENT: 0.06 %
MDC IDC STAT BRADY RV PERCENT PACED: 0.07 %

## 2017-08-04 NOTE — Progress Notes (Signed)
Wound check appointment. Steri-strips removed. Wound without redness or edema. Incision edges approximated, wound well healed. Normal device function. Thresholds, sensing, and impedances consistent with implant measurements. Device programmed at 3.5V with auto capture programmed on for extra safety margin until 3 month visit. Max RA/RV pacing lead impedances increased to 2000ohms. Histogram distribution appropriate for patient and level of activity. No mode switches or high ventricular rates noted. Patient educated about wound care, arm mobility, lifting restrictions, and BlueSync monitoring. ROV with GT on 10/27/17.

## 2017-08-10 ENCOUNTER — Telehealth: Payer: Self-pay | Admitting: Cardiology

## 2017-08-10 ENCOUNTER — Ambulatory Visit (INDEPENDENT_AMBULATORY_CARE_PROVIDER_SITE_OTHER): Payer: Medicare Other | Admitting: *Deleted

## 2017-08-10 DIAGNOSIS — I442 Atrioventricular block, complete: Secondary | ICD-10-CM

## 2017-08-10 NOTE — Telephone Encounter (Signed)
Attempted to confirm remote transmission with pt. No answer and was unable to leave a message.   

## 2017-08-11 ENCOUNTER — Ambulatory Visit: Payer: Medicare Other | Admitting: Cardiovascular Disease

## 2017-08-11 ENCOUNTER — Telehealth: Payer: Self-pay | Admitting: Internal Medicine

## 2017-08-11 ENCOUNTER — Telehealth: Payer: Self-pay

## 2017-08-11 NOTE — Telephone Encounter (Signed)
°  New Prob  Pt has some questions regarding his device. Please call.

## 2017-08-11 NOTE — Telephone Encounter (Signed)
Patient called and asked for assistance walking through how to send a remote transmission. Walked patient through steps - successfully received remote transmission. Reviewed upcoming appointments with patient. Patient verbalzied understanding of home monitor checks

## 2017-08-14 NOTE — Progress Notes (Signed)
Remote pacemaker transmission.   

## 2017-08-15 LAB — CUP PACEART REMOTE DEVICE CHECK
Brady Statistic AP VP Percent: 0.01 %
Brady Statistic AS VP Percent: 0.04 %
Brady Statistic AS VS Percent: 76.06 %
Brady Statistic RV Percent Paced: 0.06 %
Implantable Lead Implant Date: 20180919
Implantable Lead Model: 5076
Lead Channel Impedance Value: 266 Ohm
Lead Channel Impedance Value: 380 Ohm
Lead Channel Impedance Value: 380 Ohm
Lead Channel Pacing Threshold Amplitude: 0.625 V
Lead Channel Pacing Threshold Amplitude: 0.75 V
Lead Channel Pacing Threshold Pulse Width: 0.4 ms
Lead Channel Sensing Intrinsic Amplitude: 7.125 mV
Lead Channel Setting Pacing Amplitude: 3.5 V
Lead Channel Setting Pacing Pulse Width: 0.4 ms
MDC IDC LEAD IMPLANT DT: 20180919
MDC IDC LEAD LOCATION: 753859
MDC IDC LEAD LOCATION: 753860
MDC IDC MSMT BATTERY REMAINING LONGEVITY: 175 mo
MDC IDC MSMT BATTERY VOLTAGE: 3.22 V
MDC IDC MSMT LEADCHNL RA PACING THRESHOLD PULSEWIDTH: 0.4 ms
MDC IDC MSMT LEADCHNL RA SENSING INTR AMPL: 2.625 mV
MDC IDC MSMT LEADCHNL RA SENSING INTR AMPL: 2.625 mV
MDC IDC MSMT LEADCHNL RV IMPEDANCE VALUE: 456 Ohm
MDC IDC MSMT LEADCHNL RV SENSING INTR AMPL: 7.125 mV
MDC IDC PG IMPLANT DT: 20180919
MDC IDC SESS DTM: 20181005195357
MDC IDC SET LEADCHNL RV PACING AMPLITUDE: 3.5 V
MDC IDC SET LEADCHNL RV SENSING SENSITIVITY: 1.2 mV
MDC IDC STAT BRADY AP VS PERCENT: 23.89 %
MDC IDC STAT BRADY RA PERCENT PACED: 24 %

## 2017-08-17 ENCOUNTER — Encounter: Payer: Self-pay | Admitting: Cardiology

## 2017-08-24 ENCOUNTER — Telehealth: Payer: Self-pay | Admitting: Cardiology

## 2017-08-24 ENCOUNTER — Ambulatory Visit (INDEPENDENT_AMBULATORY_CARE_PROVIDER_SITE_OTHER): Payer: Medicare Other | Admitting: *Deleted

## 2017-08-24 DIAGNOSIS — I442 Atrioventricular block, complete: Secondary | ICD-10-CM

## 2017-08-24 NOTE — Progress Notes (Signed)
Remote pacemaker transmission.   

## 2017-08-24 NOTE — Telephone Encounter (Signed)
LMOVM reminding pt to send remote transmission.   

## 2017-08-29 DIAGNOSIS — G4733 Obstructive sleep apnea (adult) (pediatric): Secondary | ICD-10-CM | POA: Diagnosis not present

## 2017-08-31 ENCOUNTER — Encounter: Payer: Self-pay | Admitting: Cardiology

## 2017-09-01 ENCOUNTER — Encounter: Payer: Self-pay | Admitting: Cardiology

## 2017-09-11 DIAGNOSIS — I340 Nonrheumatic mitral (valve) insufficiency: Secondary | ICD-10-CM | POA: Diagnosis not present

## 2017-09-11 DIAGNOSIS — I422 Other hypertrophic cardiomyopathy: Secondary | ICD-10-CM | POA: Diagnosis not present

## 2017-09-11 DIAGNOSIS — Z9889 Other specified postprocedural states: Secondary | ICD-10-CM | POA: Diagnosis not present

## 2017-09-11 DIAGNOSIS — I442 Atrioventricular block, complete: Secondary | ICD-10-CM | POA: Diagnosis not present

## 2017-09-11 DIAGNOSIS — Z79899 Other long term (current) drug therapy: Secondary | ICD-10-CM | POA: Diagnosis not present

## 2017-09-11 DIAGNOSIS — Z950 Presence of cardiac pacemaker: Secondary | ICD-10-CM | POA: Diagnosis not present

## 2017-09-11 DIAGNOSIS — I517 Cardiomegaly: Secondary | ICD-10-CM | POA: Diagnosis not present

## 2017-09-14 DIAGNOSIS — E291 Testicular hypofunction: Secondary | ICD-10-CM | POA: Diagnosis not present

## 2017-09-15 LAB — CUP PACEART REMOTE DEVICE CHECK
Date Time Interrogation Session: 20181109140921
Implantable Lead Implant Date: 20180919
Implantable Lead Location: 753860
Implantable Lead Model: 5076
Implantable Pulse Generator Implant Date: 20180919
Lead Channel Setting Pacing Amplitude: 3.5 V
Lead Channel Setting Pacing Pulse Width: 0.4 ms
MDC IDC LEAD IMPLANT DT: 20180919
MDC IDC LEAD LOCATION: 753859
MDC IDC SET LEADCHNL RA PACING AMPLITUDE: 3.5 V
MDC IDC SET LEADCHNL RV SENSING SENSITIVITY: 1.2 mV

## 2017-09-27 ENCOUNTER — Encounter: Payer: Self-pay | Admitting: Cardiovascular Disease

## 2017-09-27 ENCOUNTER — Ambulatory Visit (INDEPENDENT_AMBULATORY_CARE_PROVIDER_SITE_OTHER): Payer: Medicare Other | Admitting: Cardiovascular Disease

## 2017-09-27 VITALS — BP 120/75 | HR 60 | Ht 75.0 in | Wt 210.8 lb

## 2017-09-27 DIAGNOSIS — I421 Obstructive hypertrophic cardiomyopathy: Secondary | ICD-10-CM | POA: Diagnosis not present

## 2017-09-27 NOTE — Patient Instructions (Signed)
Medication Instructions:  Your physician recommends that you continue on your current medications as directed. Please refer to the Current Medication list given to you today.   Labwork: None Ordered   Testing/Procedures: None Ordered   Follow-Up: Your physician wants you to follow-up in: 6 months with Dr. Nahser.  You will receive a reminder letter in the mail two months in advance. If you don't receive a letter, please call our office to schedule the follow-up appointment.   If you need a refill on your cardiac medications before your next appointment, please call your pharmacy.   Thank you for choosing [facility]! Augustina Braddock, RN [phone]    

## 2017-09-27 NOTE — Progress Notes (Signed)
Cardiology Office Note   Date:  09/27/2017   ID:  RAYSHAWN MANEY, DOB 08-14-1946, MRN [id number]  PCP:  Gaynelle Arabian, MD  Cardiologist:   Mertie Moores, MD   Chief Complaint  Patient presents with  . Follow-up    HOCM    1. Shortness of breath 2. Hyperlipidemia 3. Obstructive sleep apnea 4. Depression    History of Present Illness: Michael Barton is a 71 y.o. male who presents for DOE Was an avid runner and cyclist until 5 years ago. Is not able to run or cycle recently - has been a steady decline  He can now run for 1 minute and then walk for 2-3   He has noticed a definint decline in his abiltiy - now gets short of breath doing everyday activities. He become short of breath climbing stairs and walking from his office out to the car.  He denies any chest pain. He has no shortness of breath at rest. He's had an echo card gram the past which reveals left ventricular hypertrophy.  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 A stress Myoview study showed no ischemia. His ejection fraction was 58%.  Michael Barton has not improved.   Has not had his echo yet.   Aug. 18, 2017: Michael Barton is seen for his dyspnea with exertion .    Myoview Mar 28, 2016 - no ischemia.  EF 58% Echo - June 2017:   Normal LV dysfunction .   Mild dynamic LV obstruction   Cardiopulmonary stress test:    Overall normal function capacity  He has been started on Coreg - has been increased to 6.25 BID   Can walk and cycle witout problems Has DOE with running    Nov. 27, 2017:  Michael Barton Is seen back for follow-up office visit. He had an echocardiogram which reveals a  Slight dynamic obstruction with LVH.   We increased his Coreg to 6.25 BID , And he has not noticed any significant improvement in his exertional dyspnea.  Feb. 26,  2018  Michael Barton is slightly worse. Has some symptoms or orthostasis.  The change  from Coreg to Metoprolol did not help  Still hiking - especially in the warmer months   March 01, 2017:   Michael Barton is seen back today  He had stopped the metoprolol prior to getting the MRI.  Still cant walk very far without getting severe shortness of breath .    Sept. 27, 2018:  Michael Barton is seen today for follow up  He has had an alcohol septal ablation at [facility] for his HOCM.  He had an episode of syncope the week after the ablation  And was found to have  complete AV block    He had a pacer placed, he did well but had a brief episode of syncope and was re-hospitalized .   Was found to have orthostasis .   Is feeling much better.   No further episodes of syncope  Is here for wound check and pacer check and to see me for follow up.    September 27, 2017  Michael Barton is doing well.  No CP or dyspnea.  No further episdoes of syncope since getting the pacer   Past Medical History:  Diagnosis Date  . Depression   . History of orchiectomy   . HOCM (hypertrophic obstructive cardiomyopathy) ([place]) 06/24/2016  cMRI 3/18: Severe asymm septal hypertrophy, EF 74, SAM, mod MR, turb flow sugg of significant LVOT gradient, picture c/w HOCM // Echo 6/17: Mild conc LVH, LVOT gradient 14, EF 65-70, mild MR // CP stress test 6/17: NL functional capacity compared to matched sedentary norms. Pt primarily limited by body habitus and deconditioning // Echo 9/12: EF 69.8, LVOT gradient 16   . HOCM (hypertrophic obstructive cardiomyopathy) ([place])   . Hyperlipidemia   . Sleep apnea   . Testicular cancer [facility])     Past Surgical History:  Procedure Laterality Date  . ORCHIECTOMY    . PACEMAKER IMPLANT N/A 07/26/2017   Procedure: Pacemaker Implant;  Surgeon: Evans Lance, MD;  Location: [place] [facility];  Service: Cardiovascular;  Laterality: N/A;  . RIGHT/LEFT HEART CATH AND CORONARY ANGIOGRAPHY N/A 04/10/2017   Procedure: Right/Left Heart Cath and Coronary Angiography;  Surgeon: Nelva Bush, MD;  Location: [place] [facility];  Service: Cardiovascular;  Laterality: N/A;  . TEMPORARY PACEMAKER N/A 07/25/2017   Procedure: Temporary Pacemaker;  Surgeon: Sherren Mocha, MD;  Location: [place] [facility];  Service: Cardiovascular;  Laterality: N/A;     Current Outpatient Medications  Medication Sig Dispense Refill  . aspirin EC 81 MG tablet Take 1 tablet (81 mg total) by mouth daily.    Marland Kitchen buPROPion (WELLBUTRIN SR) 100 MG 12 hr tablet Take 100 mg by mouth 2 (two) times daily.    Marland Kitchen lamoTRIgine (LAMICTAL) 200 MG tablet Take 200 mg by mouth daily.    . metoprolol succinate (TOPROL-XL) 100 MG 24 hr tablet Take 100 mg by mouth daily. Take with or immediately following a meal.    . pravastatin (PRAVACHOL) 40 MG tablet Take 40 mg by mouth daily.    . QUEtiapine (SEROQUEL) 200 MG tablet Take 200 mg by mouth at bedtime.    Marland Kitchen testosterone (ANDROGEL) 50 MG/5GM (1%) GEL Place 1.25 g onto the skin daily. to each shoulder    . venlafaxine XR (EFFEXOR-XR) 150 MG 24 hr capsule Take 150 mg by mouth daily with breakfast.     No current facility-administered medications for this visit.     Allergies:   Ambien [zolpidem tartrate]; Ambien [zolpidem tartrate]; Prozac [fluoxetine hcl]; and Prozac [fluoxetine]    Social History:  The patient  reports that  has never smoked. he has never used smokeless tobacco. He reports that he does not drink alcohol or use drugs.   Family History:  The patient's family history includes Cancer in his father; Depression in his mother; Thyroid disease in his mother.    ROS:  Please see the history of present illness.          All other systems are reviewed and negative.     Physical Exam: Blood pressure 120/75, pulse 60, height 6\' 3"  (1.905 m), weight 210 lb 12.8 oz (95.6 kg), SpO2 95 %.  GEN:  Well nourished, well developed in no acute distress HEENT: Normal NECK: No JVD; No carotid bruits LYMPHATICS: No lymphadenopathy CARDIAC: RR, no murmurs, rubs, gallops RESPIRATORY:   Clear to auscultation without rales, wheezing or rhonchi  ABDOMEN: Soft, non-tender, non-distended MUSCULOSKELETAL:  No edema; No deformity  SKIN: Warm and dry NEUROLOGIC:  Alert and oriented x 3      EKG:    Recent Labs: 07/26/2017: Magnesium 2.2 07/29/2017: ALT 24; B Natriuretic Peptide 188.9; BUN 16; Creatinine, Ser 1.29; Hemoglobin 11.2; Platelets 240; Potassium 3.8; Sodium 133; TSH 2.904    Lipid Panel  Component Value Date/Time   CHOL 157 07/25/2017 1145   TRIG 85 07/25/2017 1145   HDL 53 07/25/2017 1145   CHOLHDL 3.0 07/25/2017 1145   VLDL 17 07/25/2017 1145   LDLCALC 87 07/25/2017 1145      Wt Readings from Last 3 Encounters:  09/27/17 210 lb 12.8 oz (95.6 kg)  08/03/17 214 lb 9.6 oz (97.3 kg)  07/29/17 213 lb 6.4 oz (96.8 kg)      Other studies Reviewed: Additional studies/ records that were reviewed today include: . Review of the above records demonstrates:    ASSESSMENT AND PLAN:  1.  Dynamic left ventricular outflow tract obstruction -   Is status post alcohol septal ablation.  He is doing very well.  He is not having any significant shortness of breath.  His murmur is not audible at this point.  2. Complete heart block: He developed heart block after his septal ablation.  He now has a pacemaker.  3.   Orthostatic hypotension :   Doing much better.  No further episodes of orthostatic hypotension.  I will see him in 6 months.  Current medicines are reviewed at length with the patient today.  The patient does not have concerns regarding medicines.  The following changes have been made:  no change  Labs/ tests ordered today include:   No orders of the defined types were placed in this encounter.   Mertie Moores, MD  09/27/2017 3:33 PM    [facility] [place], [place], [place]  [address] Phone: [phone]; Fax: [phone]

## 2017-10-26 ENCOUNTER — Ambulatory Visit (INDEPENDENT_AMBULATORY_CARE_PROVIDER_SITE_OTHER): Payer: Medicare Other | Admitting: *Deleted

## 2017-10-26 DIAGNOSIS — I442 Atrioventricular block, complete: Secondary | ICD-10-CM | POA: Diagnosis not present

## 2017-10-26 NOTE — Progress Notes (Signed)
Remote pacemaker transmission.   

## 2017-10-27 ENCOUNTER — Ambulatory Visit (INDEPENDENT_AMBULATORY_CARE_PROVIDER_SITE_OTHER): Payer: Medicare Other | Admitting: Internal Medicine

## 2017-10-27 ENCOUNTER — Encounter: Payer: Self-pay | Admitting: Cardiology

## 2017-10-27 ENCOUNTER — Encounter: Payer: Self-pay | Admitting: Internal Medicine

## 2017-10-27 VITALS — BP 132/72 | HR 64 | Ht 75.0 in | Wt 213.0 lb

## 2017-10-27 DIAGNOSIS — B360 Pityriasis versicolor: Secondary | ICD-10-CM | POA: Insufficient documentation

## 2017-10-27 DIAGNOSIS — I421 Obstructive hypertrophic cardiomyopathy: Secondary | ICD-10-CM | POA: Diagnosis not present

## 2017-10-27 DIAGNOSIS — N183 Chronic kidney disease, stage 3 unspecified: Secondary | ICD-10-CM | POA: Insufficient documentation

## 2017-10-27 DIAGNOSIS — Z950 Presence of cardiac pacemaker: Secondary | ICD-10-CM

## 2017-10-27 DIAGNOSIS — I442 Atrioventricular block, complete: Secondary | ICD-10-CM | POA: Diagnosis not present

## 2017-10-27 DIAGNOSIS — F322 Major depressive disorder, single episode, severe without psychotic features: Secondary | ICD-10-CM | POA: Insufficient documentation

## 2017-10-27 DIAGNOSIS — E291 Testicular hypofunction: Secondary | ICD-10-CM | POA: Insufficient documentation

## 2017-10-27 NOTE — Patient Instructions (Signed)
Medication Instructions:  Your physician recommends that you continue on your current medications as directed. Please refer to the Current Medication list given to you today.  Labwork: None ordered.  Testing/Procedures: None ordered.  Follow-Up: Your physician wants you to follow-up in: one year with Dr. Lovena Le.   You will receive a reminder letter in the mail two months in advance. If you don't receive a letter, please call our office to schedule the follow-up appointment.  Remote monitoring is used to monitor your Pacemaker from home. This monitoring reduces the number of office visits required to check your device to one time per year. It allows [place] to keep an eye on the functioning of your device to ensure it is working properly. You are scheduled for a device check from home on 01/25/2018. You may send your transmission at any time that day. If you have a wireless device, the transmission will be sent automatically. After your physician reviews your transmission, you will receive a postcard with your next transmission date.  Any Other Special Instructions Will Be Listed Below (If Applicable).  If you need a refill on your cardiac medications before your next appointment, please call your pharmacy.

## 2017-10-27 NOTE — Progress Notes (Signed)
HPI Michael Barton returns today for evaluation and management of his PPM in the setting of sinus node dysfunction. He has intermittent CHB in the setting of HCM. He had an ETOH septal ablation at [facility] complicated by syncope several days after the procedure and documented CHB with long pauses. He feels well in the interim with no chest pain or sob.  Allergies  Allergen Reactions  . Ambien [Zolpidem Tartrate]      GERD   . Ambien [Zolpidem Tartrate] Other (See Comments)    GERD  . Prozac [Fluoxetine Hcl]     Gives a weird feeling   . Prozac [Fluoxetine] Other (See Comments)    "makes me feel weird"     Current Outpatient Medications  Medication Sig Dispense Refill  . aspirin EC 81 MG tablet Take 1 tablet (81 mg total) by mouth daily.    Marland Kitchen buPROPion (WELLBUTRIN SR) 100 MG 12 hr tablet Take 100 mg by mouth 2 (two) times daily.    Marland Kitchen lamoTRIgine (LAMICTAL) 200 MG tablet Take 200 mg by mouth daily.    . metoprolol succinate (TOPROL-XL) 100 MG 24 hr tablet Take 100 mg by mouth daily. Take with or immediately following a meal.    . pravastatin (PRAVACHOL) 40 MG tablet Take 40 mg by mouth daily.    . QUEtiapine (SEROQUEL) 200 MG tablet Take 200 mg by mouth at bedtime.    Marland Kitchen testosterone (ANDROGEL) 50 MG/5GM (1%) GEL Place 1.25 g onto the skin daily. to each shoulder    . venlafaxine XR (EFFEXOR-XR) 150 MG 24 hr capsule Take 150 mg by mouth daily with breakfast.     No current facility-administered medications for this visit.      Past Medical History:  Diagnosis Date  . Depression   . History of orchiectomy   . HOCM (hypertrophic obstructive cardiomyopathy) ([place]) 06/24/2016   cMRI 3/18: Severe asymm septal hypertrophy, EF 74, SAM, mod MR, turb flow sugg of significant LVOT gradient, picture c/w HOCM // Echo 6/17: Mild conc LVH, LVOT gradient 14, EF 65-70, mild MR // CP stress test 6/17: NL functional capacity compared to matched sedentary norms. Pt primarily limited by body habitus  and deconditioning // Echo 9/12: EF 69.8, LVOT gradient 16   . HOCM (hypertrophic obstructive cardiomyopathy) ([place])   . Hyperlipidemia   . Sleep apnea   . Testicular cancer ([place])     ROS:   All systems reviewed and negative except as noted in the HPI.   Past Surgical History:  Procedure Laterality Date  . ORCHIECTOMY    . PACEMAKER IMPLANT N/A 07/26/2017   Procedure: Pacemaker Implant;  Surgeon: Evans Lance, MD;  Location: [place] [facility];  Service: Cardiovascular;  Laterality: N/A;  . RIGHT/LEFT HEART CATH AND CORONARY ANGIOGRAPHY N/A 04/10/2017   Procedure: Right/Left Heart Cath and Coronary Angiography;  Surgeon: Nelva Bush, MD;  Location: [place] [facility];  Service: Cardiovascular;  Laterality: N/A;  . TEMPORARY PACEMAKER N/A 07/25/2017   Procedure: Temporary Pacemaker;  Surgeon: Sherren Mocha, MD;  Location: [place] [facility];  Service: Cardiovascular;  Laterality: N/A;     Family History  Problem Relation Age of Onset  . Thyroid disease Mother   . Depression Mother   . Cancer Father      Social History   Socioeconomic History  . Marital status: Single    Spouse name: Not on file  . Number of children: Not on file  . Years of  education: Not on file  . Highest education level: Not on file  Social Needs  . Financial resource strain: Not on file  . Food insecurity - worry: Not on file  . Food insecurity - inability: Not on file  . Transportation needs - medical: Not on file  . Transportation needs - non-medical: Not on file  Occupational History  . Not on file  Tobacco Use  . Smoking status: Never Smoker  . Smokeless tobacco: Never Used  Substance and Sexual Activity  . Alcohol use: No  . Drug use: No  . Sexual activity: Not on file  Other Topics Concern  . Not on file  Social History Narrative   ** Merged History Encounter **         BP 132/72   Pulse 64   Ht 6\' 3"  (1.905 m)   Wt 213 lb (96.6 kg)   SpO2 93%   BMI 26.62 kg/m    Physical Exam:  Well appearing 71 yo man, NAD HEENT: Unremarkable Neck:  6 cm JVD, no thyromegally Lymphatics:  No adenopathy Back:  No CVA tenderness Lungs:  Clear with no wheezes HEART:  Regular rate rhythm, no murmurs, no rubs, no clicks Abd:  soft, positive bowel sounds, no organomegally, no rebound, no guarding Ext:  2 plus pulses, no edema, no cyanosis, no clubbing Skin:  No rashes no nodules Neuro:  CN II through XII intact, motor grossly intact  EKG - NSR with RBBB  DEVICE  Normal device function.  See PaceArt for details.   Assess/Plan: 1. CHB - he is conducting nicely at present. Will follow. 2. PPM - his medtronic MRI PPM is working normally. Will recheck in several months. 3. HCM - he is s/p ETOH septal ablation and his symptoms have resolved.   Michael Barton.D.

## 2017-10-29 LAB — CUP PACEART INCLINIC DEVICE CHECK
Battery Remaining Longevity: 180 mo
Battery Voltage: 3.2 V
Brady Statistic AP VP Percent: 0.01 %
Brady Statistic AP VS Percent: 10.65 %
Brady Statistic RV Percent Paced: 0.07 %
Date Time Interrogation Session: 20181221150256
Implantable Lead Implant Date: 20180919
Implantable Lead Implant Date: 20180919
Implantable Lead Location: 753859
Implantable Lead Model: 5076
Implantable Pulse Generator Implant Date: 20180919
Lead Channel Impedance Value: 361 Ohm
Lead Channel Impedance Value: 437 Ohm
Lead Channel Impedance Value: 494 Ohm
Lead Channel Sensing Intrinsic Amplitude: 3.25 mV
Lead Channel Sensing Intrinsic Amplitude: 9.375 mV
Lead Channel Sensing Intrinsic Amplitude: 9.375 mV
Lead Channel Setting Pacing Amplitude: 2 V
Lead Channel Setting Pacing Amplitude: 2.5 V
Lead Channel Setting Pacing Pulse Width: 0.4 ms
Lead Channel Setting Sensing Sensitivity: 1.2 mV
MDC IDC LEAD LOCATION: 753860
MDC IDC MSMT LEADCHNL RA IMPEDANCE VALUE: 285 Ohm
MDC IDC MSMT LEADCHNL RA PACING THRESHOLD AMPLITUDE: 0.75 V
MDC IDC MSMT LEADCHNL RA PACING THRESHOLD PULSEWIDTH: 0.4 ms
MDC IDC MSMT LEADCHNL RA SENSING INTR AMPL: 3.75 mV
MDC IDC MSMT LEADCHNL RV PACING THRESHOLD AMPLITUDE: 0.75 V
MDC IDC MSMT LEADCHNL RV PACING THRESHOLD PULSEWIDTH: 0.4 ms
MDC IDC STAT BRADY AS VP PERCENT: 0.06 %
MDC IDC STAT BRADY AS VS PERCENT: 89.28 %
MDC IDC STAT BRADY RA PERCENT PACED: 10.74 %

## 2017-11-09 LAB — CUP PACEART REMOTE DEVICE CHECK
Date Time Interrogation Session: 20190103110301
Implantable Lead Implant Date: 20180919
Implantable Lead Location: 753860
Implantable Lead Model: 5076
Implantable Pulse Generator Implant Date: 20180919
MDC IDC LEAD IMPLANT DT: 20180919
MDC IDC LEAD LOCATION: 753859

## 2017-11-10 DIAGNOSIS — F319 Bipolar disorder, unspecified: Secondary | ICD-10-CM | POA: Diagnosis not present

## 2017-11-10 DIAGNOSIS — F3170 Bipolar disorder, currently in remission, most recent episode unspecified: Secondary | ICD-10-CM | POA: Diagnosis not present

## 2017-11-10 DIAGNOSIS — Z79899 Other long term (current) drug therapy: Secondary | ICD-10-CM | POA: Diagnosis not present

## 2017-12-11 DIAGNOSIS — R948 Abnormal results of function studies of other organs and systems: Secondary | ICD-10-CM | POA: Diagnosis not present

## 2017-12-11 DIAGNOSIS — E291 Testicular hypofunction: Secondary | ICD-10-CM | POA: Diagnosis not present

## 2018-01-03 DIAGNOSIS — H524 Presbyopia: Secondary | ICD-10-CM | POA: Diagnosis not present

## 2018-01-03 DIAGNOSIS — H25093 Other age-related incipient cataract, bilateral: Secondary | ICD-10-CM | POA: Diagnosis not present

## 2018-01-25 ENCOUNTER — Ambulatory Visit (INDEPENDENT_AMBULATORY_CARE_PROVIDER_SITE_OTHER): Payer: Medicare Other | Admitting: *Deleted

## 2018-01-25 DIAGNOSIS — I442 Atrioventricular block, complete: Secondary | ICD-10-CM | POA: Diagnosis not present

## 2018-01-25 NOTE — Progress Notes (Signed)
Remote pacemaker transmission.   

## 2018-01-26 ENCOUNTER — Encounter: Payer: Self-pay | Admitting: Cardiology

## 2018-02-16 LAB — CUP PACEART REMOTE DEVICE CHECK
Implantable Lead Implant Date: 20180919
Implantable Lead Location: 753860
Implantable Lead Model: 5076
Implantable Pulse Generator Implant Date: 20180919
MDC IDC LEAD IMPLANT DT: 20180919
MDC IDC LEAD LOCATION: 753859
MDC IDC SESS DTM: 20190412103002

## 2018-02-28 ENCOUNTER — Telehealth: Payer: Self-pay | Admitting: Cardiovascular Disease

## 2018-02-28 NOTE — Telephone Encounter (Signed)
Dr Acie Fredrickson con you comment on this- they want to know if he should be done in the hospital.  Kerin Ransom PA-C 02/28/2018 3:34 PM

## 2018-02-28 NOTE — Telephone Encounter (Signed)
New Message:         [facility] Pre-operative Risk Assessment    Request for surgical clearance:  1. What type of surgery is being performed? Tooth extraction with graphing  2. When is this surgery scheduled? TBD   3. What type of clearance is required (medical clearance vs. Pharmacy clearance to hold med vs. Both)? medical  4. Are there any medications that need to be held prior to surgery and how long?No  5. Practice name and name of physician performing surgery? Dr. Loyal Gambler [facility]  6. What is your office phone number  [phone].   What is your office fax number [phone].   Anesthesia type (None, local, MAC, general) ? IV(local)   They would like to know what device patient has and would pt be stable enough to have procedure done in office or hospital  Michael Barton 02/28/2018, 10:33 AM  _________________________________________________________________   (provider comments below)

## 2018-03-01 NOTE — Telephone Encounter (Signed)
[name] is at low risk for dental extraction and grafting .  I would prefer local anesthesia, He has a pacemaker and is at low risk He may have this performed in the office

## 2018-03-01 NOTE — Telephone Encounter (Signed)
   Primary Cardiologist:Philip Nahser, MD  Chart reviewed as part of pre-operative protocol coverage. Pre-op clearance already addressed by colleagues in earlier phone notes. Dr. Acie Fredrickson states: "[name] is at low risk for dental extraction and grafting .  I would prefer local anesthesia He has a pacemaker and is at low risk He may have this performed in the office"  Will route this bundled recommendation to requesting provider via Epic fax function. Please call with questions.  Charlie Pitter, PA-C 03/01/2018, 3:18 PM

## 2018-03-07 IMAGING — DX DG CHEST 1V PORT
1 series · 1 of 1 positions shown · non-contrast
Comparison: none

[chest ap]
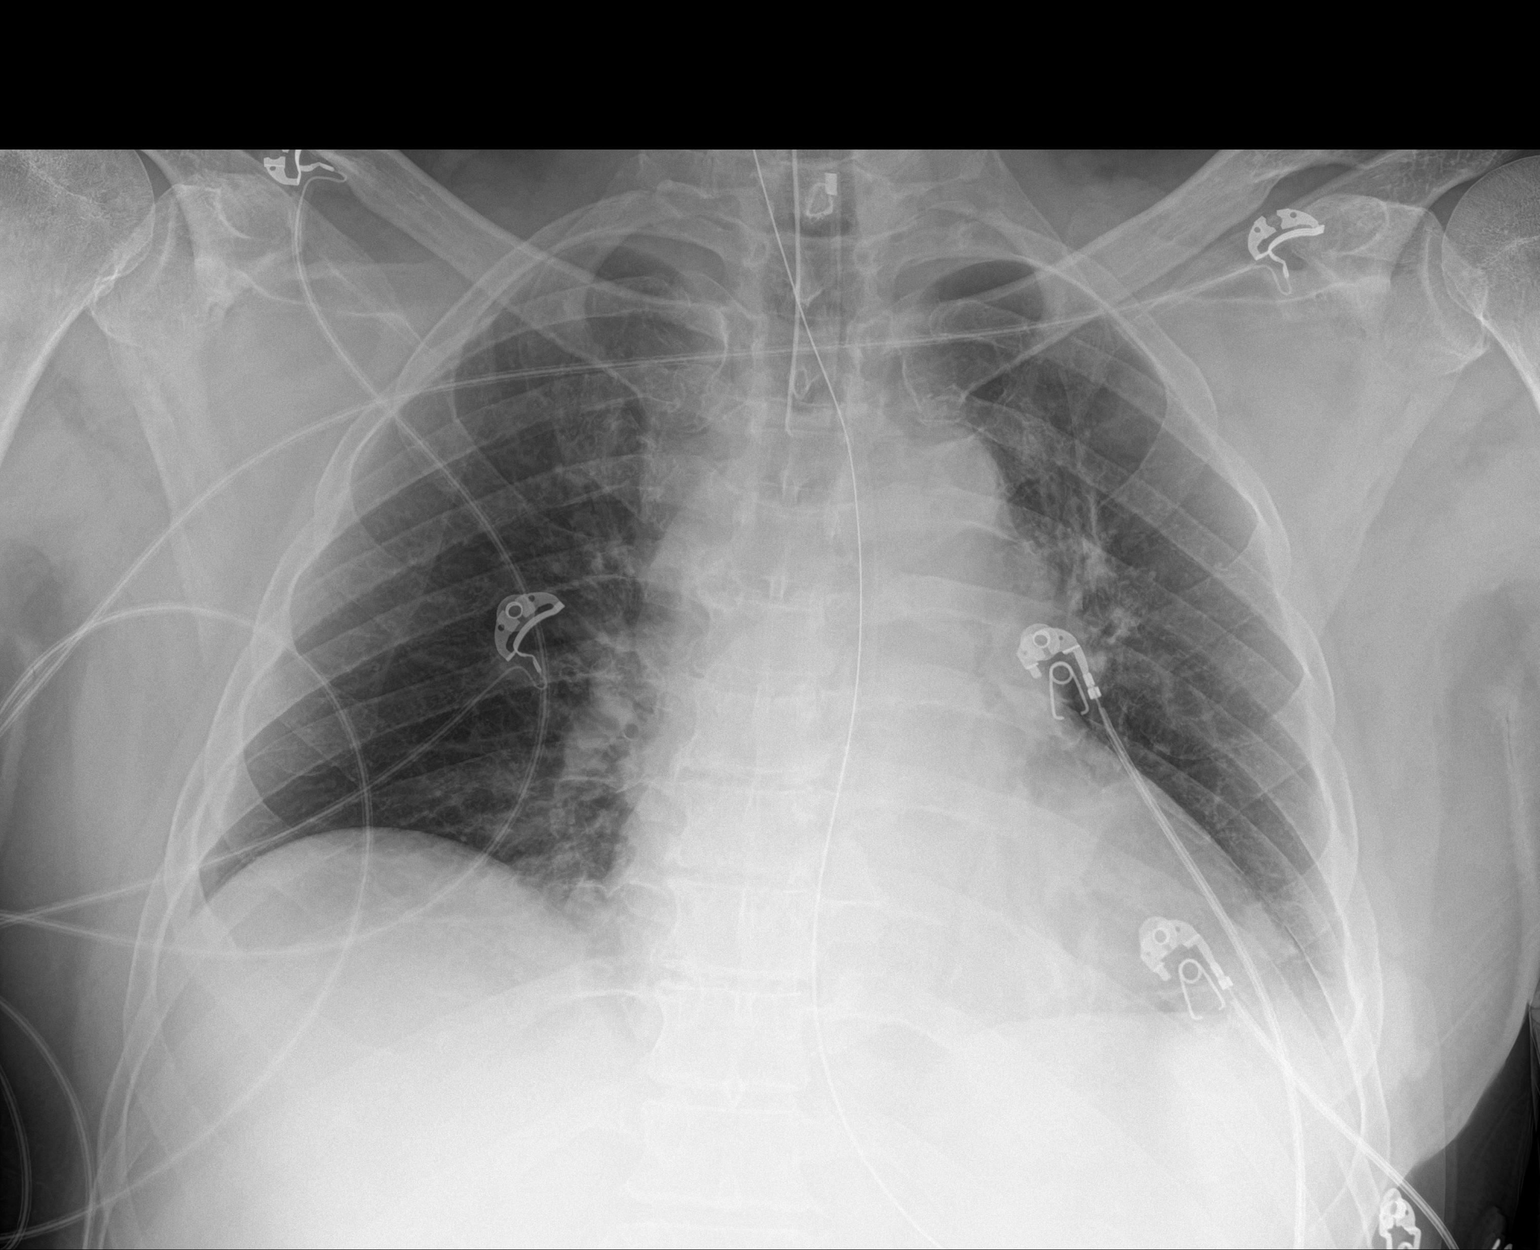

[1 of 1 positions shown; findings below may reference images not displayed]

Canned report from images found in remote index.

Refer to host system for actual result text.

## 2018-03-11 IMAGING — CR DG CHEST 2V
2 series · 2 of 2 positions shown · non-contrast
Comparison: 07/25/2017

CLINICAL DATA: Chest pain and dizziness tonight. Syncope. Pacemaker
placed 3 days ago.

EXAM:
CHEST  2 VIEW

[chest pa]
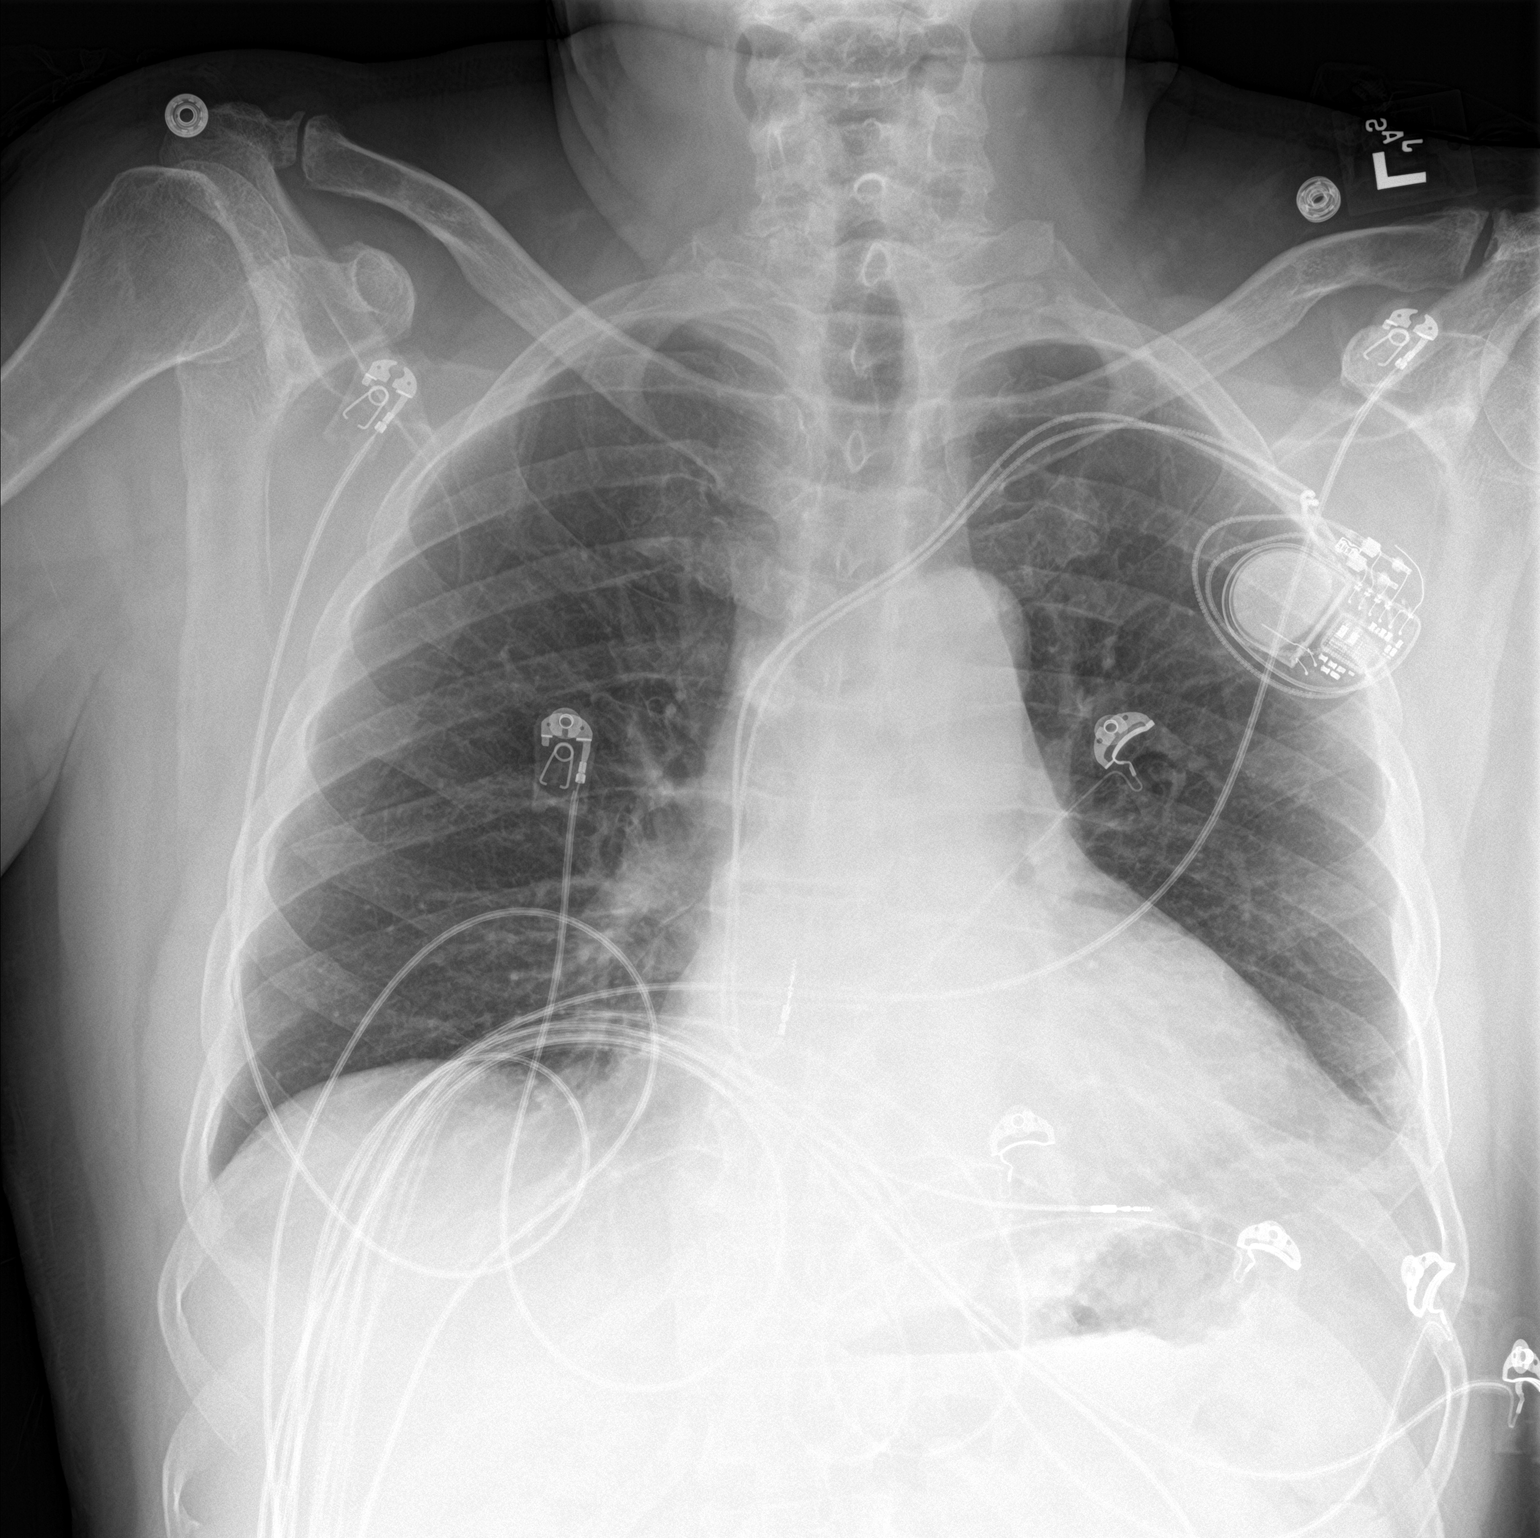

[chest lat]
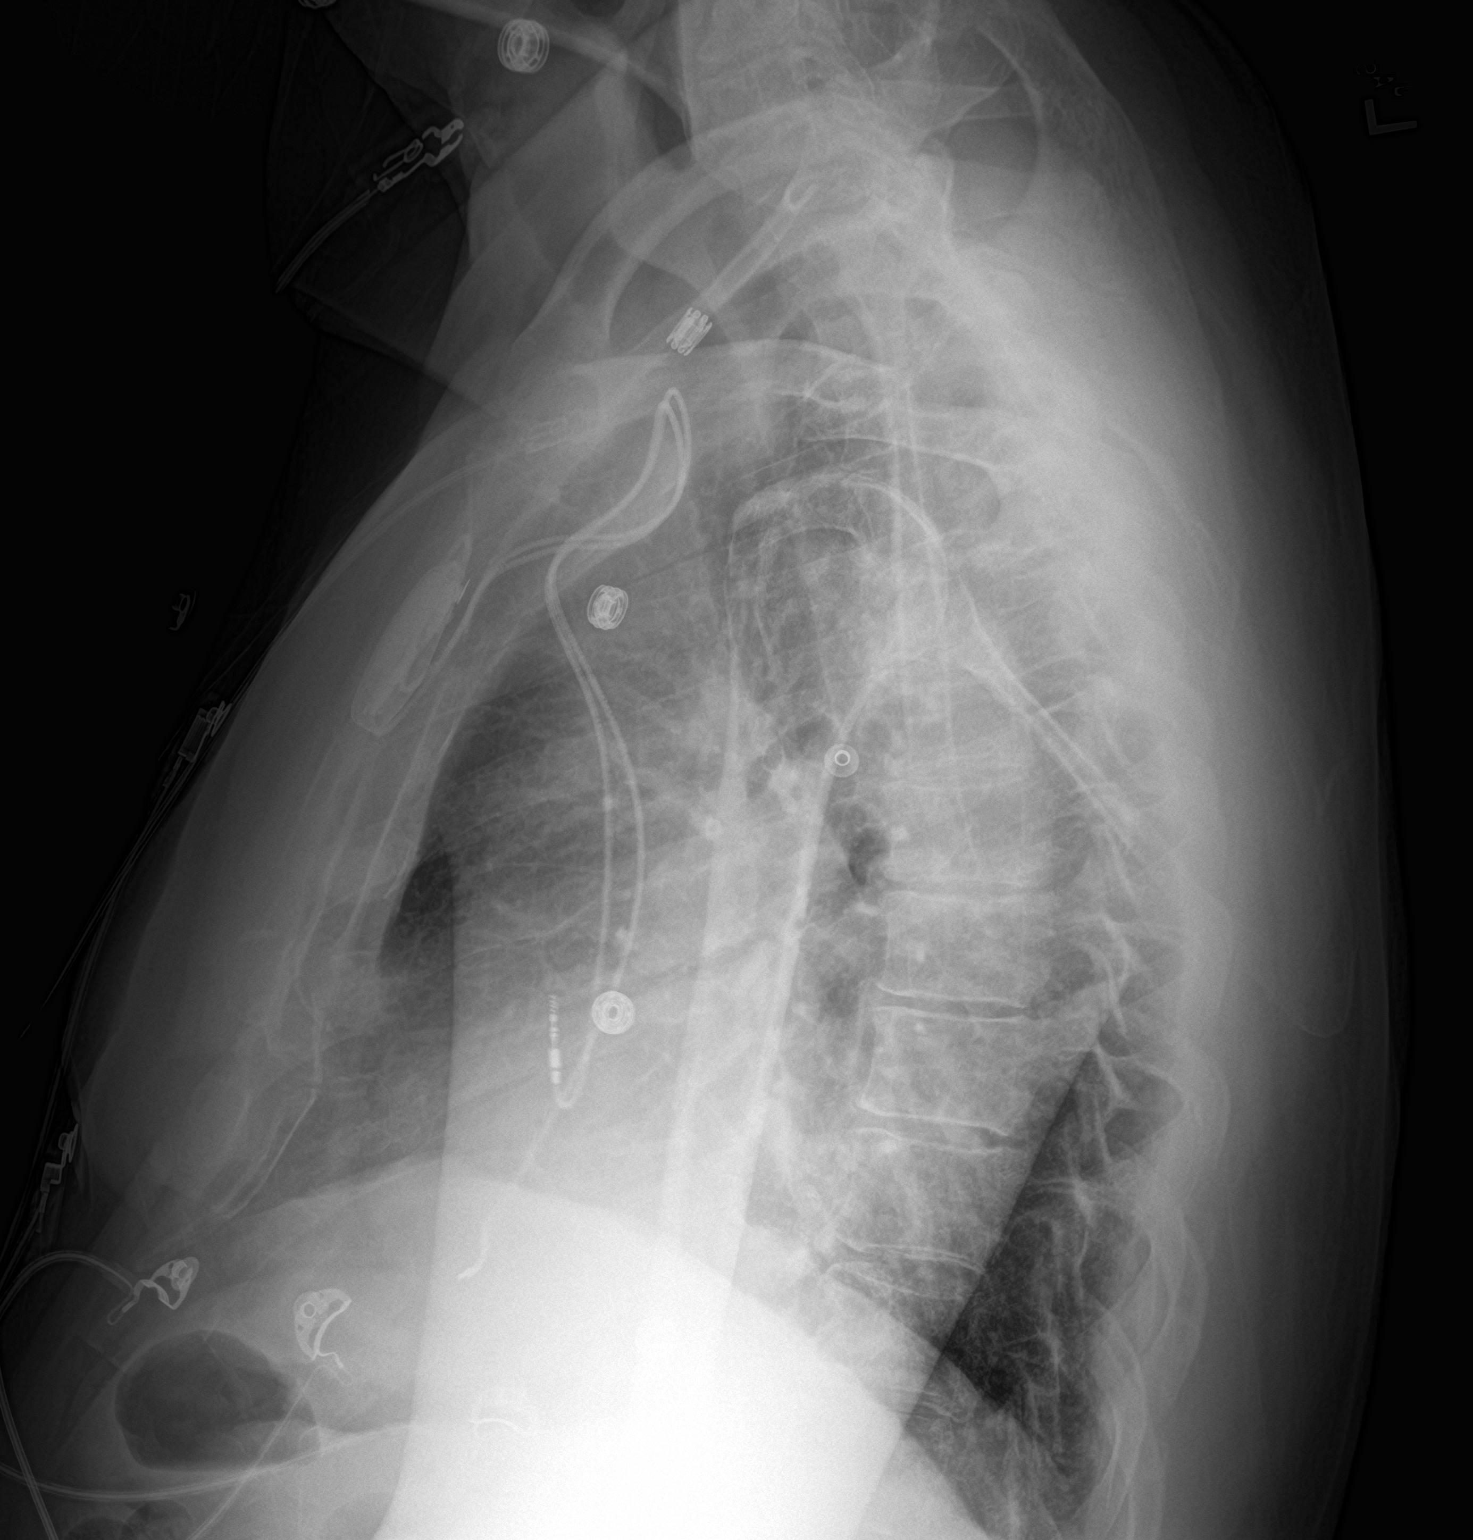

[2 of 2 positions shown; findings below may reference images not displayed]

FINDINGS: Cardiac pacemaker with lead tips over the right atrium and right
ventricle. No pneumothorax. Mild cardiac enlargement. No vascular
congestion or edema. No focal consolidation. Mild atelectasis or
scarring in the left lung base similar to previous study.
IMPRESSION: Cardiac pacemaker appears in satisfactory position. No pneumothorax.
Mild cardiac enlargement. Slight fibrosis or linear atelectasis in
the left lung base.

## 2018-03-11 IMAGING — CT CT CERVICAL SPINE W/O CM
5 of 8 series · 12 of 33 positions shown, 13 images · non-contrast
Comparison: None.

CLINICAL DATA: Syncopal episode after getting up from seated
position. Struck head on floor. Nausea. Pacemaker placed 3 days ago.
History of hypertrophic cardiomyopathy, testicular cancer.

EXAM:
CT HEAD WITHOUT CONTRAST
CT CERVICAL SPINE WITHOUT CONTRAST
TECHNIQUE: Multidetector CT imaging of the head and cervical spine was
performed following the standard protocol without intravenous
contrast. Multiplanar CT image reconstructions of the cervical spine
were also generated.

[Series 5: head bone · axial · 0.51mm/px · z∈[-131,-67]mm · 2 of 97 slices shown]
[im 33/97  bone]
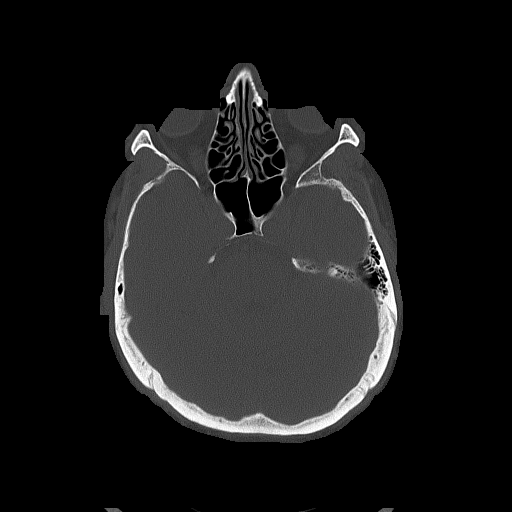
[im 65/97  bone]
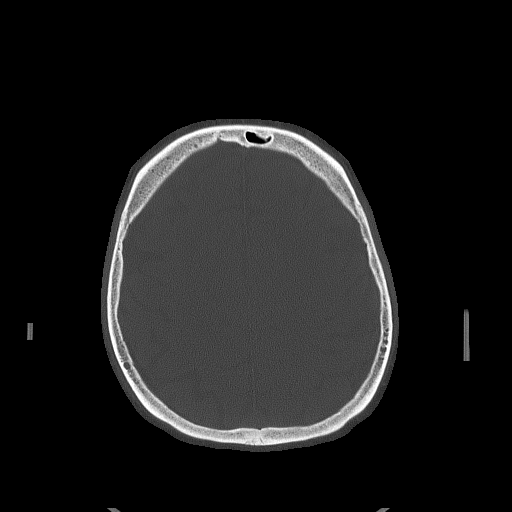

[Series 8: c_spine 2.0 st · axial · 0.33mm/px · z∈[-316,-200]mm · 3 of 118 slices shown, 4 images]
[im 30/118  soft-tissue]
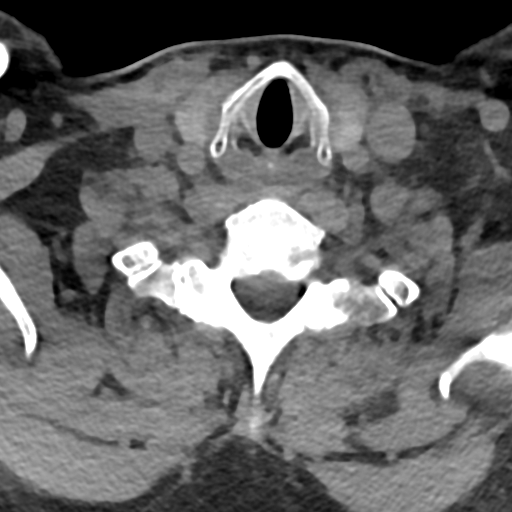
[im 30/118  bone]
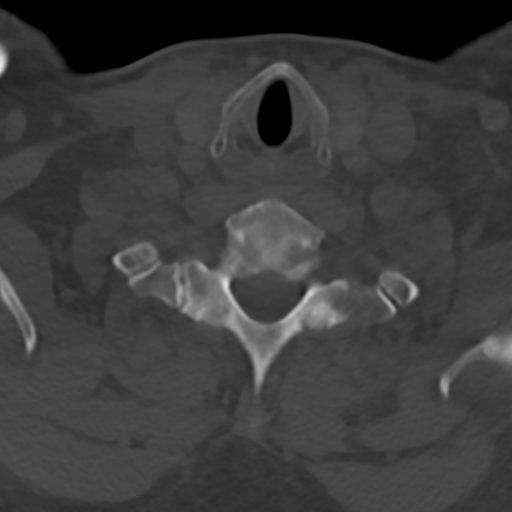
[im 59/118  bone]
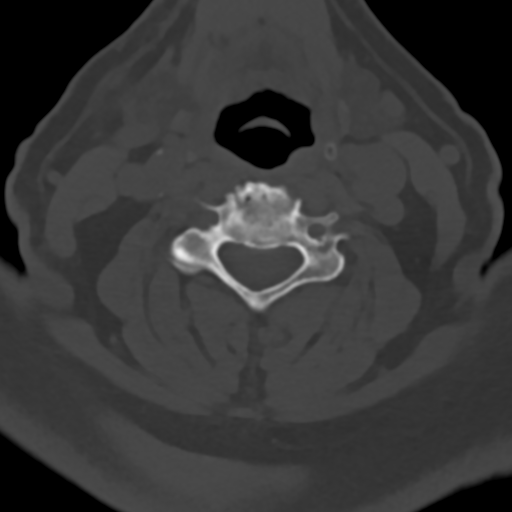
[im 88/118  bone]
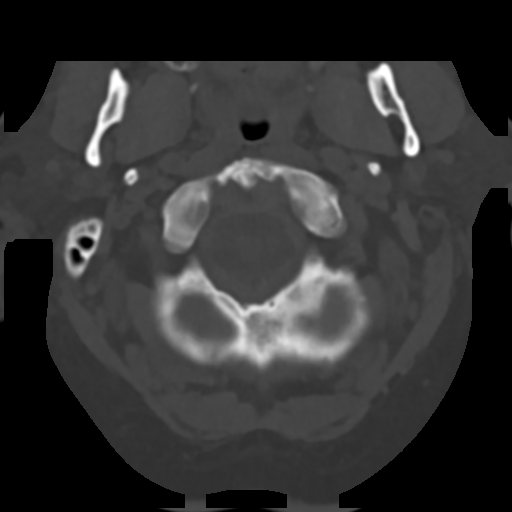

[Series 10: c_spine 2.0 sag bone · sagittal · 0.35mm/px · 4 of 61 slices shown]
[im 13/61  bone]
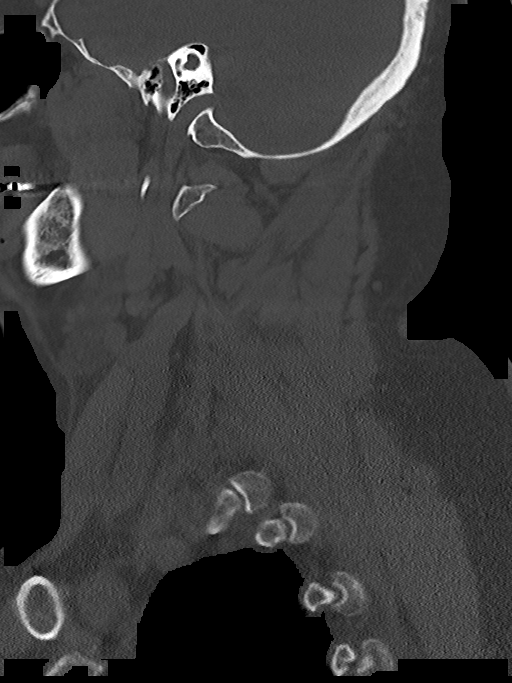
[im 25/61  bone]
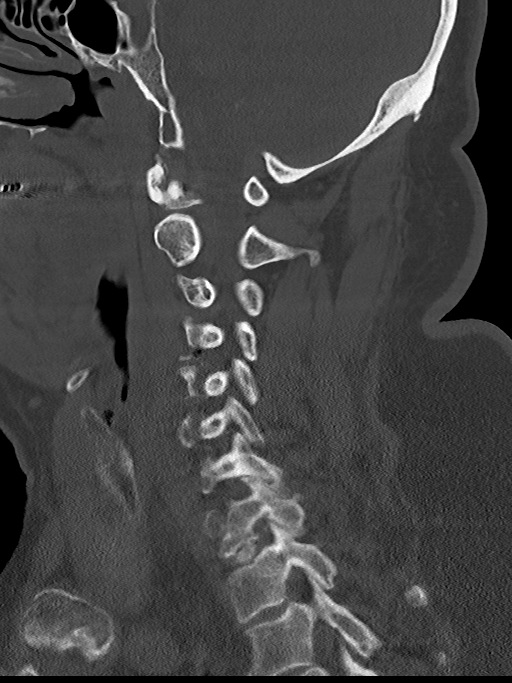
[im 37/61  bone]
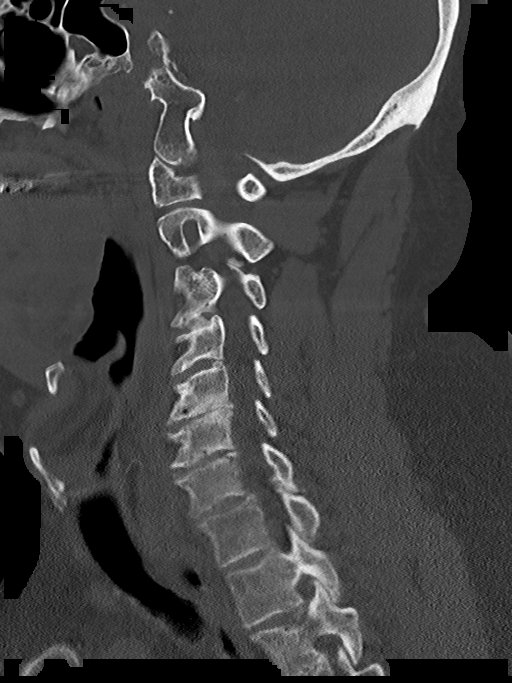
[im 49/61  bone]
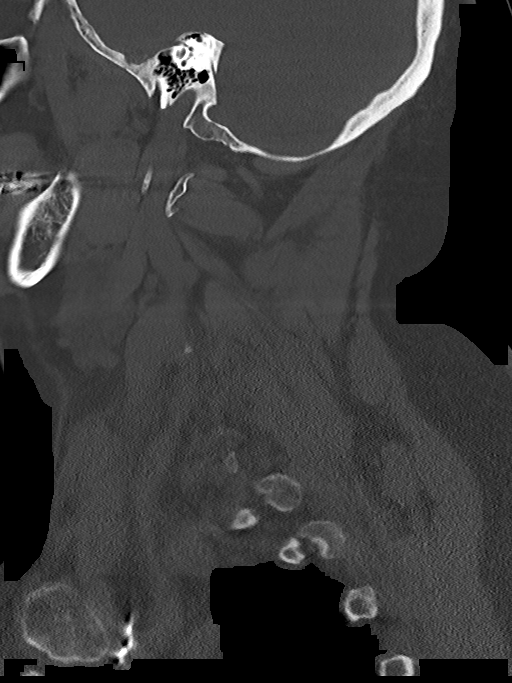

[Series 11: c_spine 2.0 cor bone · coronal · 0.35mm/px · 1 of 61 slices shown]
[im 31/61  bone]
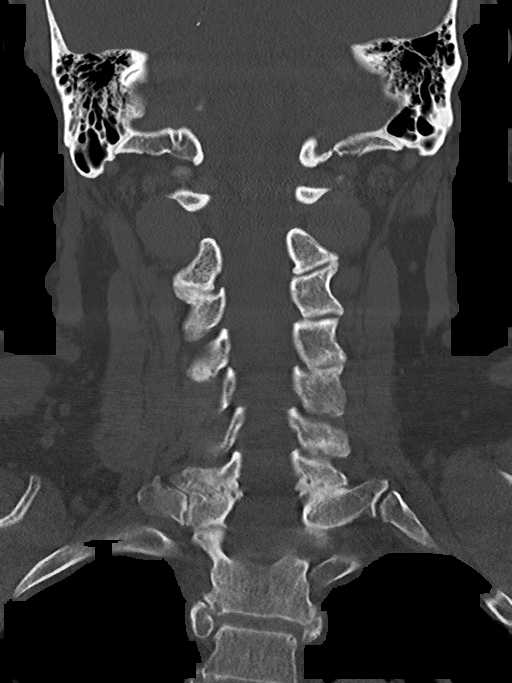

[Series 12: c_spine 2.0 orthogonals · axial · 0.21mm/px · z∈[-324,-263]mm · 2 of 101 slices shown]
[im 34/101  bone]
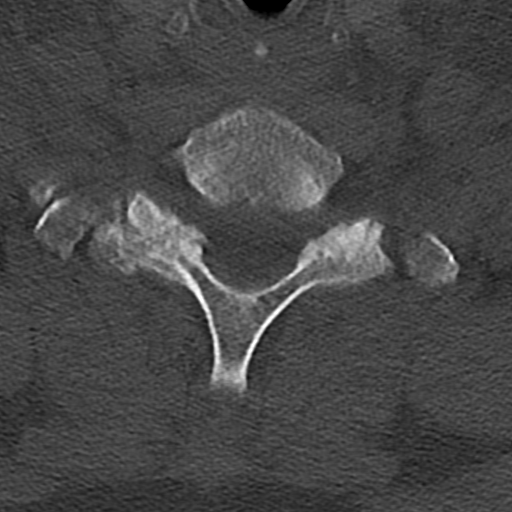
[im 67/101  bone]
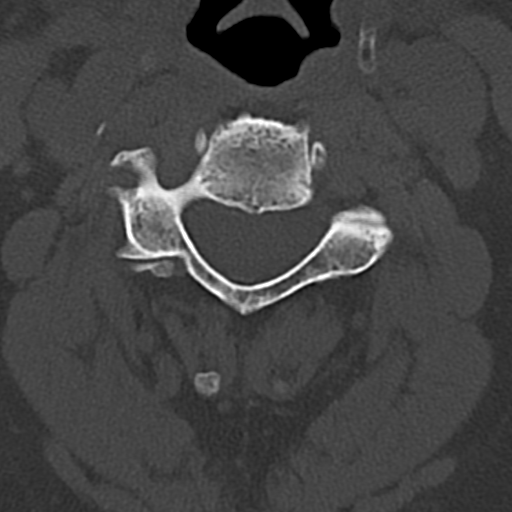

[12 of 33 positions shown; findings below may reference images not displayed]

FINDINGS: CT HEAD FINDINGS

BRAIN: No intraparenchymal hemorrhage, mass effect nor midline
shift. The ventricles and sulci are normal for age. Patchy
supratentorial white matter hypodensities less than expected for
patient's age, though non-specific are most compatible with chronic
small vessel ischemic disease. No acute large vascular territory
infarcts. No abnormal extra-axial fluid collections. Basal cisterns
are patent.

VASCULAR: Mode mild rate calcific atherosclerosis of the carotid
siphons.

SKULL: No skull fracture. Small RIGHT parietal scalp hematoma
without subcutaneous gas or radiopaque foreign bodies. 18 mm midline
suboccipital scalp lipoma.

SINUSES/ORBITS: The mastoid air-cells and included paranasal sinuses
are well-aerated.The included ocular globes and orbital contents are
non-suspicious.

OTHER: None.

CT CERVICAL SPINE FINDINGS

ALIGNMENT: Straightened lordosis. Vertebral bodies in alignment.

SKULL BASE AND VERTEBRAE: Cervical vertebral bodies and posterior
elements are intact. Moderate to severe C3-4 thru C7 eighth T1 disc
height loss with endplate sclerosis and marginal spurring. Moderate
C7-T1 facet arthropathy. No destructive bony lesions. C1-2
articulation maintained, severe arthrosis.

SOFT TISSUES AND SPINAL CANAL: Nonacute. LEFT subclavian cardiac
pacemaker wires. Mild carotid bifurcation calcifications.

DISC LEVELS: No significant osseous canal stenosis or neural
foraminal narrowing.

UPPER CHEST: Lung apices are clear.

OTHER: None.
IMPRESSION: CT HEAD:

1. No acute intracranial process. Small RIGHT parietal scalp
hematoma. No skull fracture.
2. Otherwise negative noncontrast CT HEAD for age.
CT CERVICAL SPINE:

1. No acute fracture or malalignment.

## 2018-04-26 ENCOUNTER — Encounter: Payer: Medicare Other | Admitting: *Deleted

## 2018-04-26 ENCOUNTER — Telehealth: Payer: Self-pay | Admitting: Cardiology

## 2018-04-26 NOTE — Telephone Encounter (Signed)
LMOVM reminding pt to send remote transmission.   

## 2018-04-27 ENCOUNTER — Encounter: Payer: Self-pay | Admitting: Cardiology

## 2018-05-23 ENCOUNTER — Ambulatory Visit (INDEPENDENT_AMBULATORY_CARE_PROVIDER_SITE_OTHER): Payer: Medicare Other | Admitting: *Deleted

## 2018-05-23 DIAGNOSIS — I442 Atrioventricular block, complete: Secondary | ICD-10-CM

## 2018-05-23 LAB — CUP PACEART INCLINIC DEVICE CHECK
Brady Statistic AP VS Percent: 18.03 %
Brady Statistic AS VP Percent: 0.05 %
Brady Statistic AS VS Percent: 81.91 %
Brady Statistic RA Percent Paced: 18.22 %
Implantable Lead Implant Date: 20180919
Implantable Lead Location: 753860
Implantable Lead Model: 5076
Lead Channel Impedance Value: 304 Ohm
Lead Channel Impedance Value: 532 Ohm
Lead Channel Pacing Threshold Amplitude: 0.75 V
Lead Channel Pacing Threshold Pulse Width: 0.4 ms
Lead Channel Pacing Threshold Pulse Width: 0.4 ms
Lead Channel Sensing Intrinsic Amplitude: 10 mV
Lead Channel Sensing Intrinsic Amplitude: 3.75 mV
Lead Channel Sensing Intrinsic Amplitude: 4 mV
Lead Channel Sensing Intrinsic Amplitude: 8.25 mV
Lead Channel Setting Pacing Amplitude: 2 V
Lead Channel Setting Pacing Pulse Width: 0.4 ms
MDC IDC LEAD IMPLANT DT: 20180919
MDC IDC LEAD LOCATION: 753859
MDC IDC MSMT BATTERY REMAINING LONGEVITY: 172 mo
MDC IDC MSMT BATTERY VOLTAGE: 3.12 V
MDC IDC MSMT LEADCHNL RA PACING THRESHOLD AMPLITUDE: 0.625 V
MDC IDC MSMT LEADCHNL RV IMPEDANCE VALUE: 380 Ohm
MDC IDC MSMT LEADCHNL RV IMPEDANCE VALUE: 475 Ohm
MDC IDC PG IMPLANT DT: 20180919
MDC IDC SESS DTM: 20190717163238
MDC IDC SET LEADCHNL RV PACING AMPLITUDE: 2.5 V
MDC IDC SET LEADCHNL RV SENSING SENSITIVITY: 1.2 mV
MDC IDC STAT BRADY AP VP PERCENT: 0.01 %
MDC IDC STAT BRADY RV PERCENT PACED: 0.06 %

## 2018-05-23 NOTE — Progress Notes (Signed)
Device check in clinic by industry. See scanned report for details.  (2) Fast A/V episodes - both 1 min in duration, 1:1 SVT. No changes made this session. Blu sync paired to new cell phone.  Follow up: GT on 9/20 @ 1500 as scheduled.

## 2018-06-11 DIAGNOSIS — R948 Abnormal results of function studies of other organs and systems: Secondary | ICD-10-CM | POA: Diagnosis not present

## 2018-06-11 DIAGNOSIS — E291 Testicular hypofunction: Secondary | ICD-10-CM | POA: Diagnosis not present

## 2018-06-18 DIAGNOSIS — E291 Testicular hypofunction: Secondary | ICD-10-CM | POA: Diagnosis not present

## 2018-06-28 DIAGNOSIS — F319 Bipolar disorder, unspecified: Secondary | ICD-10-CM | POA: Diagnosis not present

## 2018-06-28 DIAGNOSIS — F3170 Bipolar disorder, currently in remission, most recent episode unspecified: Secondary | ICD-10-CM | POA: Diagnosis not present

## 2018-07-13 ENCOUNTER — Encounter: Payer: Self-pay | Admitting: Internal Medicine

## 2018-07-27 ENCOUNTER — Encounter: Payer: Self-pay | Admitting: Internal Medicine

## 2018-07-27 ENCOUNTER — Ambulatory Visit (INDEPENDENT_AMBULATORY_CARE_PROVIDER_SITE_OTHER): Payer: Medicare Other | Admitting: Internal Medicine

## 2018-07-27 VITALS — BP 144/86 | HR 60 | Ht 75.0 in | Wt 217.0 lb

## 2018-07-27 DIAGNOSIS — Z950 Presence of cardiac pacemaker: Secondary | ICD-10-CM | POA: Diagnosis not present

## 2018-07-27 DIAGNOSIS — I421 Obstructive hypertrophic cardiomyopathy: Secondary | ICD-10-CM

## 2018-07-27 DIAGNOSIS — I442 Atrioventricular block, complete: Secondary | ICD-10-CM | POA: Diagnosis not present

## 2018-07-27 NOTE — Patient Instructions (Signed)
Medication Instructions:  Your physician recommends that you continue on your current medications as directed. Please refer to the Current Medication list given to you today.  Labwork: None ordered.  Testing/Procedures: None ordered.  Follow-Up: Your physician wants you to follow-up in: one year with Dr. Lovena Le.   You will receive a reminder letter in the mail two months in advance. If you don't receive a letter, please call our office to schedule the follow-up appointment.  Remote monitoring is used to monitor your Pacemaker from home. This monitoring reduces the number of office visits required to check your device to one time per year. It allows [place] to keep an eye on the functioning of your device to ensure it is working properly. You are scheduled for a device check from home on 10/26/2018. You may send your transmission at any time that day. If you have a wireless device, the transmission will be sent automatically. After your physician reviews your transmission, you will receive a postcard with your next transmission date.  Any Other Special Instructions Will Be Listed Below (If Applicable).  If you need a refill on your cardiac medications before your next appointment, please call your pharmacy.

## 2018-07-27 NOTE — Progress Notes (Signed)
HPI Michael Barton returns today for evaluation and management of his PPM in the setting of sinus node dysfunction. He has intermittent CHB in the setting of HCM. He had an ETOH septal ablation at [facility] complicated by syncope several days after the procedure and documented CHB with long pauses. He feels well in the interim with no chest pain or sob. He has not had syncope.  Allergies  Allergen Reactions  . Ambien [Zolpidem Tartrate]      GERD   . Ambien [Zolpidem Tartrate] Other (See Comments)    GERD  . Prozac [Fluoxetine Hcl]     Gives a weird feeling   . Prozac [Fluoxetine] Other (See Comments)    "makes me feel weird"     Current Outpatient Medications  Medication Sig Dispense Refill  . aspirin EC 81 MG tablet Take 1 tablet (81 mg total) by mouth daily.    Marland Kitchen buPROPion (WELLBUTRIN SR) 100 MG 12 hr tablet Take 100 mg by mouth 2 (two) times daily.    Marland Kitchen lamoTRIgine (LAMICTAL) 200 MG tablet Take 200 mg by mouth daily.    . metoprolol succinate (TOPROL-XL) 100 MG 24 hr tablet Take 100 mg by mouth daily. Take with or immediately following a meal.    . pravastatin (PRAVACHOL) 40 MG tablet Take 40 mg by mouth daily.    . QUEtiapine (SEROQUEL) 200 MG tablet Take 200 mg by mouth at bedtime.    Marland Kitchen testosterone (ANDROGEL) 50 MG/5GM (1%) GEL Place 1.25 g onto the skin daily. to each shoulder    . venlafaxine XR (EFFEXOR-XR) 150 MG 24 hr capsule Take 150 mg by mouth daily with breakfast.     No current facility-administered medications for this visit.      Past Medical History:  Diagnosis Date  . Depression   . History of orchiectomy   . HOCM (hypertrophic obstructive cardiomyopathy) ([place]) 06/24/2016   cMRI 3/18: Severe asymm septal hypertrophy, EF 74, SAM, mod MR, turb flow sugg of significant LVOT gradient, picture c/w HOCM // Echo 6/17: Mild conc LVH, LVOT gradient 14, EF 65-70, mild MR // CP stress test 6/17: NL functional capacity compared to matched sedentary norms. Pt primarily  limited by body habitus and deconditioning // Echo 9/12: EF 69.8, LVOT gradient 16   . HOCM (hypertrophic obstructive cardiomyopathy) ([place])   . Hyperlipidemia   . Sleep apnea   . Testicular cancer ([place])     ROS:   All systems reviewed and negative except as noted in the HPI.   Past Surgical History:  Procedure Laterality Date  . ORCHIECTOMY    . PACEMAKER IMPLANT N/A 07/26/2017   Procedure: Pacemaker Implant;  Surgeon: Evans Lance, MD;  Location: [place] [facility];  Service: Cardiovascular;  Laterality: N/A;  . RIGHT/LEFT HEART CATH AND CORONARY ANGIOGRAPHY N/A 04/10/2017   Procedure: Right/Left Heart Cath and Coronary Angiography;  Surgeon: Nelva Bush, MD;  Location: [place] [facility];  Service: Cardiovascular;  Laterality: N/A;  . TEMPORARY PACEMAKER N/A 07/25/2017   Procedure: Temporary Pacemaker;  Surgeon: Sherren Mocha, MD;  Location: [place] [facility];  Service: Cardiovascular;  Laterality: N/A;     Family History  Problem Relation Age of Onset  . Thyroid disease Mother   . Depression Mother   . Cancer Father      Social History   Socioeconomic History  . Marital status: Single    Spouse name: Not on file  . Number of children: Not on  file  . Years of education: Not on file  . Highest education level: Not on file  Occupational History  . Not on file  Social Needs  . Financial resource strain: Not on file  . Food insecurity:    Worry: Not on file    Inability: Not on file  . Transportation needs:    Medical: Not on file    Non-medical: Not on file  Tobacco Use  . Smoking status: Never Smoker  . Smokeless tobacco: Never Used  Substance and Sexual Activity  . Alcohol use: No  . Drug use: No  . Sexual activity: Not on file  Lifestyle  . Physical activity:    Days per week: Not on file    Minutes per session: Not on file  . Stress: Not on file  Relationships  . Social connections:    Talks on phone: Not on file    Gets together: Not on file     Attends religious service: Not on file    Active member of club or organization: Not on file    Attends meetings of clubs or organizations: Not on file    Relationship status: Not on file  . Intimate partner violence:    Fear of current or ex partner: Not on file    Emotionally abused: Not on file    Physically abused: Not on file    Forced sexual activity: Not on file  Other Topics Concern  . Not on file  Social History Narrative   ** Merged History Encounter **         BP (!) 144/86   Pulse 60   Ht 6\' 3"  (1.905 m)   Wt 217 lb (98.4 kg)   SpO2 99%   BMI 27.12 kg/m   Physical Exam:  Well appearing 72 yo man, NAD HEENT: Unremarkable Neck:  No JVD, no thyromegally Lymphatics:  No adenopathy Back:  No CVA tenderness Lungs:  Clear with no wheezes HEART:  Regular rate rhythm, no murmurs, no rubs, no clicks Abd:  soft, positive bowel sounds, no organomegally, no rebound, no guarding Ext:  2 plus pulses, no edema, no cyanosis, no clubbing Skin:  No rashes no nodules Neuro:  CN II through XII intact, motor grossly intact  EKG - none  DEVICE  Normal device function.  See PaceArt for details.   Assess/Plan: 1. CHB - his AV conduction is good today. He will pace as needed. 2. PPM - his medtronic DDD PM is working normally. His RV threshold is up a bit but stable. 3. HCM - he is s/p septal ablation. No changes.  Michael Barton.D.

## 2018-08-23 DIAGNOSIS — Z23 Encounter for immunization: Secondary | ICD-10-CM | POA: Diagnosis not present

## 2018-08-23 DIAGNOSIS — G4733 Obstructive sleep apnea (adult) (pediatric): Secondary | ICD-10-CM | POA: Diagnosis not present

## 2018-08-23 DIAGNOSIS — I421 Obstructive hypertrophic cardiomyopathy: Secondary | ICD-10-CM | POA: Diagnosis not present

## 2018-08-23 DIAGNOSIS — F322 Major depressive disorder, single episode, severe without psychotic features: Secondary | ICD-10-CM | POA: Diagnosis not present

## 2018-08-23 DIAGNOSIS — I495 Sick sinus syndrome: Secondary | ICD-10-CM | POA: Diagnosis not present

## 2018-08-23 DIAGNOSIS — E291 Testicular hypofunction: Secondary | ICD-10-CM | POA: Diagnosis not present

## 2018-08-23 DIAGNOSIS — N183 Chronic kidney disease, stage 3 (moderate): Secondary | ICD-10-CM | POA: Diagnosis not present

## 2018-08-23 DIAGNOSIS — Z950 Presence of cardiac pacemaker: Secondary | ICD-10-CM | POA: Diagnosis not present

## 2018-08-23 DIAGNOSIS — E7800 Pure hypercholesterolemia, unspecified: Secondary | ICD-10-CM | POA: Diagnosis not present

## 2018-08-23 DIAGNOSIS — Z0000 Encounter for general adult medical examination without abnormal findings: Secondary | ICD-10-CM | POA: Diagnosis not present

## 2018-08-23 DIAGNOSIS — S5000XA Contusion of unspecified elbow, initial encounter: Secondary | ICD-10-CM | POA: Diagnosis not present

## 2018-08-23 DIAGNOSIS — Z1159 Encounter for screening for other viral diseases: Secondary | ICD-10-CM | POA: Diagnosis not present

## 2018-09-04 ENCOUNTER — Telehealth: Payer: Self-pay | Admitting: Cardiovascular Disease

## 2018-09-04 DIAGNOSIS — L1230 Acquired epidermolysis bullosa, unspecified: Secondary | ICD-10-CM | POA: Diagnosis not present

## 2018-09-04 DIAGNOSIS — Z23 Encounter for immunization: Secondary | ICD-10-CM | POA: Diagnosis not present

## 2018-09-04 MED ORDER — METOPROLOL SUCCINATE ER 100 MG PO TB24: 100.0000 mg | ORAL_TABLET | Freq: Every day | ORAL | 0 refills | Status: DC

## 2018-09-04 NOTE — Telephone Encounter (Signed)
Pt came into the office requesting a paper Rx for metoprolol succinate that Dr. Acie Fredrickson did not prescribed. Pt was on Metoprolol lopressor, but another doctor prescribed metoprolol succinate. Pt would like a call back concerning this matter. Pt stated that he only has 3 pills left. Please address

## 2018-09-04 NOTE — Telephone Encounter (Signed)
Pt's medication was sent to pt's pharmacy as requested. Confirmation received.  °

## 2018-09-04 NOTE — Telephone Encounter (Signed)
OK to prescribe Metoprolol Succinate 100 mg PO daily

## 2018-09-13 DIAGNOSIS — I422 Other hypertrophic cardiomyopathy: Secondary | ICD-10-CM | POA: Diagnosis not present

## 2018-10-02 ENCOUNTER — Other Ambulatory Visit: Payer: Self-pay

## 2018-10-02 ENCOUNTER — Encounter (HOSPITAL_COMMUNITY): Payer: Self-pay | Admitting: Emergency Medicine

## 2018-10-02 ENCOUNTER — Emergency Department (HOSPITAL_COMMUNITY)
Admission: EM | Admit: 2018-10-02 | Discharge: 2018-10-02 | Disposition: A | Discharge status: Home / Self Care | Payer: Medicare Other | Attending: Emergency Medicine | Admitting: Emergency Medicine

## 2018-10-02 ENCOUNTER — Emergency Department (HOSPITAL_COMMUNITY): Payer: Medicare Other

## 2018-10-02 DIAGNOSIS — R42 Dizziness and giddiness: Secondary | ICD-10-CM | POA: Diagnosis not present

## 2018-10-02 DIAGNOSIS — Z79899 Other long term (current) drug therapy: Secondary | ICD-10-CM | POA: Diagnosis not present

## 2018-10-02 DIAGNOSIS — R55 Syncope and collapse: Secondary | ICD-10-CM | POA: Diagnosis not present

## 2018-10-02 DIAGNOSIS — I421 Obstructive hypertrophic cardiomyopathy: Secondary | ICD-10-CM | POA: Diagnosis not present

## 2018-10-02 DIAGNOSIS — Z950 Presence of cardiac pacemaker: Secondary | ICD-10-CM | POA: Diagnosis not present

## 2018-10-02 DIAGNOSIS — H8110 Benign paroxysmal vertigo, unspecified ear: Secondary | ICD-10-CM | POA: Diagnosis not present

## 2018-10-02 DIAGNOSIS — I951 Orthostatic hypotension: Secondary | ICD-10-CM | POA: Insufficient documentation

## 2018-10-02 DIAGNOSIS — I959 Hypotension, unspecified: Secondary | ICD-10-CM | POA: Diagnosis not present

## 2018-10-02 DIAGNOSIS — Z7982 Long term (current) use of aspirin: Secondary | ICD-10-CM | POA: Insufficient documentation

## 2018-10-02 DIAGNOSIS — R0902 Hypoxemia: Secondary | ICD-10-CM | POA: Diagnosis not present

## 2018-10-02 DIAGNOSIS — R0789 Other chest pain: Secondary | ICD-10-CM | POA: Diagnosis not present

## 2018-10-02 DIAGNOSIS — I4510 Unspecified right bundle-branch block: Secondary | ICD-10-CM | POA: Diagnosis not present

## 2018-10-02 DIAGNOSIS — N183 Chronic kidney disease, stage 3 (moderate): Secondary | ICD-10-CM | POA: Diagnosis not present

## 2018-10-02 LAB — BASIC METABOLIC PANEL
Anion gap: 7 (ref 5–15)
BUN: 18 mg/dL (ref 8–23)
CO2: 25 mmol/L (ref 22–32)
CREATININE: 1.71 mg/dL — AB (ref 0.61–1.24)
Calcium: 8.6 mg/dL — ABNORMAL LOW (ref 8.9–10.3)
Chloride: 101 mmol/L (ref 98–111)
GFR calc Af Amer: 45 mL/min — ABNORMAL LOW (ref >60)
GFR calc non Af Amer: 39 mL/min — ABNORMAL LOW (ref >60)
GLUCOSE: 99 mg/dL (ref 70–99)
Potassium: 3.9 mmol/L (ref 3.5–5.1)
Sodium: 133 mmol/L — ABNORMAL LOW (ref 135–145)

## 2018-10-02 LAB — CBC WITH DIFFERENTIAL/PLATELET
Abs Immature Granulocytes: 0 10*3/uL (ref 0.00–0.07)
BASOS ABS: 0 10*3/uL (ref 0.0–0.1)
Basophils Relative: 1 %
EOS PCT: 5 %
Eosinophils Absolute: 0.2 10*3/uL (ref 0.0–0.5)
HEMATOCRIT: 40.5 % (ref 39.0–52.0)
HEMOGLOBIN: 13.5 g/dL (ref 13.0–17.0)
Immature Granulocytes: 0 %
LYMPHS ABS: 0.9 10*3/uL (ref 0.7–4.0)
Lymphocytes Relative: 30 %
MCH: 28.7 pg (ref 26.0–34.0)
MCHC: 33.3 g/dL (ref 30.0–36.0)
MCV: 86.2 fL (ref 80.0–100.0)
MONO ABS: 0.4 10*3/uL (ref 0.1–1.0)
MONOS PCT: 13 %
Neutro Abs: 1.4 10*3/uL — ABNORMAL LOW (ref 1.7–7.7)
Neutrophils Relative %: 51 %
Platelets: 206 10*3/uL (ref 150–400)
RBC: 4.7 MIL/uL (ref 4.22–5.81)
RDW: 12 % (ref 11.5–15.5)
WBC: 2.8 10*3/uL — ABNORMAL LOW (ref 4.0–10.5)
nRBC: 0 % (ref 0.0–0.2)

## 2018-10-02 LAB — BRAIN NATRIURETIC PEPTIDE: B Natriuretic Peptide: 50.1 pg/mL (ref 0.0–100.0)

## 2018-10-02 LAB — I-STAT TROPONIN, ED: Troponin i, poc: 0.01 ng/mL (ref 0.00–0.08)

## 2018-10-02 LAB — I-STAT CG4 LACTIC ACID, ED: Lactic Acid, Venous: 1.67 mmol/L (ref 0.5–1.9)

## 2018-10-02 MED ORDER — SODIUM CHLORIDE 0.9 % IV BOLUS
1000.0000 mL | Freq: Once | INTRAVENOUS | Status: AC
  Administered 2018-10-02: 1000 mL via INTRAVENOUS

## 2018-10-02 NOTE — Discharge Instructions (Signed)
You were evaluated in the emergency department for feeling like he might pass out.  You had some blood work EKG and chest x-ray.  It looks like you might be a little dehydrated.  He received some IV fluids here and were feeling improved.  Please follow-up with your doctor.  Return if any concerns.

## 2018-10-02 NOTE — ED Provider Notes (Signed)
[facility] EMERGENCY DEPARTMENT Provider Note   CSN: [id number] Arrival date & time: 10/02/18  1148     History   Chief Complaint Chief Complaint  Patient presents with  . Hypotension    HPI Michael Barton is a 72 y.o. male.  He has a prior history of hokum and needed an ablation and followed by getting a pacemaker for a syncopal event.  He was having a normal day today and got up from talking to somebody was walking down the hallway when he acutely felt lightheaded like he might pass out.  He needed to lower himself to the floor.  He never lost consciousness.  Initial blood pressure was in the 80s.  He received some fluids from EMS and is improving here.  He still feels like if he stood up he would pass out again.  No headache no numbness no weakness no chest pain or shortness of breath no recent illness no vomiting or diarrhea.  No change in his medications.  The history is provided by the patient and the EMS personnel.  Near Syncope  This is a new problem. The current episode started less than 1 hour ago. The problem occurs constantly. The problem has been gradually improving. Pertinent negatives include no chest pain, no abdominal pain, no headaches and no shortness of breath. The symptoms are aggravated by standing. The symptoms are relieved by position. He has tried nothing for the symptoms. The treatment provided no relief.    Past Medical History:  Diagnosis Date  . Depression   . History of orchiectomy   . HOCM (hypertrophic obstructive cardiomyopathy) ([place]) 06/24/2016   cMRI 3/18: Severe asymm septal hypertrophy, EF 74, SAM, mod MR, turb flow sugg of significant LVOT gradient, picture c/w HOCM // Echo 6/17: Mild conc LVH, LVOT gradient 14, EF 65-70, mild MR // CP stress test 6/17: NL functional capacity compared to matched sedentary norms. Pt primarily limited by body habitus and deconditioning // Echo 9/12: EF 69.8, LVOT gradient 16   . HOCM  (hypertrophic obstructive cardiomyopathy) ([place])   . Hyperlipidemia   . Sleep apnea   . Testicular cancer [facility])     Patient Active Problem List   Diagnosis Date Noted  . Chronic kidney disease (CKD), stage III (moderate) ([place]) 10/27/2017  . Chromophytosis 10/27/2017  . 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 Major depressive disorder, single episode, severe without psychotic features ([place]) 10/27/2017  . Syncope 07/29/2017  . Elevated troponin 07/29/2017  . Depression 07/29/2017  . Hypercholesterolemia 07/29/2017  . Syncope and collapse 07/29/2017  . Complete heart block ([place]) 07/25/2017  . Cardiac arrest ([place]) 07/25/2017  . HOCM (hypertrophic obstructive cardiomyopathy) ([place]) 07/25/2017  . Acute respiratory failure with hypoxia ([place])   . Cardiogenic shock ([place])   . Obstructive sleep apnea 07/19/2017  . History of testicular cancer 06/01/2017  . Hypertrophic cardiomyopathy ([place]) 06/01/2017  . HOCM (hypertrophic obstructive cardiomyopathy) ([place]) 06/24/2016  . Breath shortness 02/23/2016  . Below normal amount of sodium in the blood 01/29/2016  . Cardiac murmur 01/29/2016  . Bipolar affective disorder ([place]) 11/16/2011    Past Surgical History:  Procedure Laterality Date  . ORCHIECTOMY    . PACEMAKER IMPLANT N/A 07/26/2017   Procedure: Pacemaker Implant;  Surgeon: Evans Lance, MD;  Location: [place] [facility];  Service: Cardiovascular;  Laterality: N/A;  . RIGHT/LEFT HEART CATH AND CORONARY ANGIOGRAPHY N/A 04/10/2017   Procedure: Right/Left Heart Cath and Coronary Angiography;  Surgeon: Nelva Bush, MD;  Location:  [place] INVASIVE CV LAB;  Service: Cardiovascular;  Laterality: N/A;  . TEMPORARY PACEMAKER N/A 07/25/2017   Procedure: Temporary Pacemaker;  Surgeon: Sherren Mocha, MD;  Location: [place] [facility];  Service: Cardiovascular;  Laterality: N/A;        Home Medications    Prior to Admission medications   Medication Sig Start Date End Date Taking? Authorizing Provider    aspirin EC 81 MG tablet Take 1 tablet (81 mg total) by mouth daily. 04/06/17   Nahser, Wonda Cheng, MD  buPROPion (WELLBUTRIN SR) 100 MG 12 hr tablet Take 100 mg by mouth 2 (two) times daily.    [provider]  lamoTRIgine (LAMICTAL) 200 MG tablet Take 200 mg by mouth daily.    [provider]  metoprolol succinate (TOPROL-XL) 100 MG 24 hr tablet Take 1 tablet (100 mg total) by mouth daily. Take with or immediately following a meal. Please make yearly appt. 1st attempt 09/04/18   Nahser, Wonda Cheng, MD  pravastatin (PRAVACHOL) 40 MG tablet Take 40 mg by mouth daily.    [provider]  QUEtiapine (SEROQUEL) 200 MG tablet Take 200 mg by mouth at bedtime.    [provider]  testosterone (ANDROGEL) 50 MG/5GM (1%) GEL Place 1.25 g onto the skin daily. to each shoulder    [provider]  venlafaxine XR (EFFEXOR-XR) 150 MG 24 hr capsule Take 150 mg by mouth daily with breakfast.    [provider]    Family History Family History  Problem Relation Age of Onset  . Thyroid disease Mother   . Depression Mother   . Cancer Father     Social History Social History   Tobacco Use  . Smoking status: Never Smoker  . Smokeless tobacco: Never Used  Substance Use Topics  . Alcohol use: No  . Drug use: No     Allergies   Ambien [zolpidem tartrate]; Ambien [zolpidem tartrate]; Prozac [fluoxetine hcl]; and Prozac [fluoxetine]   Review of Systems Review of Systems  Constitutional: Negative for fever.  HENT: Negative for sore throat.   Eyes: Negative for visual disturbance.  Respiratory: Negative for shortness of breath.   Cardiovascular: Positive for near-syncope. Negative for chest pain.  Gastrointestinal: Negative for abdominal pain.  Genitourinary: Negative for dysuria.  Musculoskeletal: Negative for neck pain.  Skin: Negative for rash.  Neurological: Negative for headaches.     Physical Exam Updated Vital Signs BP 116/73 (BP  Location: Right Arm)   Pulse 61   Temp (!) 97.1 F (36.2 C) (Temporal)   Resp 14   Ht 6\' 3"  (1.905 m)   Wt 96.2 kg   SpO2 95%   BMI 26.50 kg/m   Physical Exam  Constitutional: He is oriented to person, place, and time. He appears well-developed and well-nourished.  HENT:  Head: Normocephalic and atraumatic.  Eyes: Conjunctivae are normal.  Neck: Neck supple.  Cardiovascular: Normal rate, regular rhythm and normal heart sounds.  No murmur heard. Pulmonary/Chest: Effort normal and breath sounds normal. No respiratory distress.  Abdominal: Soft. There is no tenderness.  Musculoskeletal: He exhibits no edema, tenderness or deformity.  Neurological: He is alert and oriented to person, place, and time. He has normal strength. No cranial nerve deficit or sensory deficit. GCS eye subscore is 4. GCS verbal subscore is 5. GCS motor subscore is 6.  Skin: Skin is warm and dry.  Psychiatric: He has a normal mood and affect.  Nursing note and vitals reviewed.    ED  Treatments / Results  Labs (all labs ordered are listed, but only abnormal results are displayed) Labs Reviewed  BASIC METABOLIC PANEL - Abnormal; Notable for the following components:      Result Value   Sodium 133 (*)    Creatinine, Ser 1.71 (*)    Calcium 8.6 (*)    GFR calc non Af Amer 39 (*)    GFR calc Af Amer 45 (*)    All other components within normal limits  CBC WITH DIFFERENTIAL/PLATELET - Abnormal; Notable for the following components:   WBC 2.8 (*)    Neutro Abs 1.4 (*)    All other components within normal limits  BRAIN NATRIURETIC PEPTIDE  URINALYSIS, ROUTINE W REFLEX MICROSCOPIC  I-STAT TROPONIN, ED  I-STAT CG4 LACTIC ACID, ED  I-STAT CG4 LACTIC ACID, ED    EKG EKG Interpretation  Date/Time:  Tuesday October 02 2018 11:54:52 EST Ventricular Rate:  66 PR Interval:    QRS Duration: 152 QT Interval:  393 QTC Calculation: 412 R Axis:   25 Text Interpretation:  Sinus rhythm Right bundle branch  block similar to prior 9/18 Confirmed by Aletta Edouard [id number]) on 10/02/2018 11:59:02 AM   Radiology Dg Chest Port 1 View  Result Date: 10/02/2018 CLINICAL DATA:  Syncopal episode EXAM: PORTABLE CHEST 1 VIEW COMPARISON:  07/29/2017 FINDINGS: Cardiac shadow is enlarged but stable. Pacing device is again seen. Lungs are well aerated bilaterally. Minimal scarring is noted in the left lung base laterally. No focal infiltrate is seen. Mild vascular congestion is noted without interstitial edema. IMPRESSION: Mild vascular congestion without interstitial edema. Chronic scarring in the left base. Electronically Signed   By: Inez Catalina M.D.   On: 10/02/2018 12:18    Procedures Procedures (including critical care time)  Medications Ordered in ED Medications  sodium chloride 0.9 % bolus 1,000 mL (has no administration in time range)     Initial Impression / Assessment and Plan / ED Course  I have reviewed the triage vital signs and the nursing notes.  Pertinent labs & imaging results that were available during my care of the patient were reviewed by me and considered in my medical decision making (see chart for details).  Clinical Course as of Oct 02 1941  Tue Oct 02, 2018  1321 [employer] interrogate the pacer and did not find any events.   [MB]  2330 Patient states he is feeling improved.  I have put him in for orthostatics.  Lab work is been fairly unremarkable other than a creatinine of 1.7.  They read his x-ray is possible fluid overload but BNP normal and there is received some saline.   [MB]    Clinical Course User Index [MB] Hayden Rasmussen, MD     Final Clinical Impressions(s) / ED Diagnoses   Final diagnoses:  Postural dizziness with presyncope    ED Discharge Orders    None       Hayden Rasmussen, MD 10/02/18 1942

## 2018-10-26 ENCOUNTER — Ambulatory Visit (INDEPENDENT_AMBULATORY_CARE_PROVIDER_SITE_OTHER): Payer: Medicare Other

## 2018-10-26 DIAGNOSIS — I442 Atrioventricular block, complete: Secondary | ICD-10-CM | POA: Diagnosis not present

## 2018-10-26 NOTE — Progress Notes (Signed)
Remote pacemaker transmission.   

## 2018-11-25 LAB — CUP PACEART REMOTE DEVICE CHECK
Implantable Lead Implant Date: 20180919
Implantable Lead Implant Date: 20180919
Implantable Lead Location: 753860
Implantable Lead Model: 5076
Implantable Lead Model: 5076
Implantable Pulse Generator Implant Date: 20180919
MDC IDC LEAD LOCATION: 753859
MDC IDC SESS DTM: 20200119072654

## 2018-12-04 ENCOUNTER — Other Ambulatory Visit: Payer: Self-pay | Admitting: Cardiovascular Disease

## 2018-12-10 DIAGNOSIS — E291 Testicular hypofunction: Secondary | ICD-10-CM | POA: Diagnosis not present

## 2018-12-10 DIAGNOSIS — R948 Abnormal results of function studies of other organs and systems: Secondary | ICD-10-CM | POA: Diagnosis not present

## 2018-12-13 DIAGNOSIS — E291 Testicular hypofunction: Secondary | ICD-10-CM | POA: Diagnosis not present

## 2018-12-28 DIAGNOSIS — F319 Bipolar disorder, unspecified: Secondary | ICD-10-CM | POA: Diagnosis not present

## 2019-01-02 ENCOUNTER — Telehealth: Payer: Self-pay | Admitting: Internal Medicine

## 2019-01-02 NOTE — Telephone Encounter (Signed)
Spoke w/ pt and instructed him to send a manual transmission. Reminded pt that the app must stay open and running in the background at all times. Spoke w/ pt and instructed him to call tech support for further help w/ disconnected monitor.

## 2019-01-02 NOTE — Telephone Encounter (Signed)
New Message    1. Has your device fired? No  2. Is you device beeping? No  3. Are you experiencing draining or swelling at device site? No  4. Are you calling to see if we received your device transmission? No  5. Have you passed out? No  PT is calling because he got an email that says its not connected to the app and he tried open the link in the email and it is not working.      Please route to [place]

## 2019-01-03 ENCOUNTER — Telehealth: Payer: Self-pay | Admitting: Cardiovascular Disease

## 2019-01-03 NOTE — Telephone Encounter (Signed)
New Message    Pt c/o BP issue: STAT if pt c/o blurred vision, one-sided weakness or slurred speech  1. What are your last 5 BP readings? 202/111 patient only has the BP reading  2. Are you having any other symptoms (ex. Dizziness, headache, blurred vision, passed out)? NO   3. What is your BP issue? Patient's PCP took BP and told patient to contact Dr. Acie Fredrickson.

## 2019-01-03 NOTE — Telephone Encounter (Signed)
Spoke with patient who states he had a high BP reading at the dentist office and was advised to call to let us know. I asked if he is monitoring BP any other time and he states he occasionally checks his BP at a local pharmacy. I gave him guidelines of < 130/80 mmHg and advised him to monitor BP approximately 1-2 hours after taking medications and not within 30 minutes of eating. He verbalized understanding and states he has started back exercising and walked on treadmill last night for 40 min without difficulty. I advised him not to check BP right after exercising. He is scheduled for follow-up in May with Dr. Acie Fredrickson and I advised him to call back sooner with concerns or questions. He thanked me for the call.

## 2019-01-03 NOTE — Telephone Encounter (Signed)
Agree with note by Christen Bame, RN  I'll see him in several months

## 2019-01-03 NOTE — Telephone Encounter (Signed)
Spoke with patient who reports he had his BP checked at the dentist approximately 1 hour ago and it was 202/111.  They asked him to contact this office for f/u.  Patient does not have any other BP readings.  Reports he has been taking medications as listed including Metoprolol 100 mg daily.  He has no s/s at this point.  Advised I will have Dr Acie Fredrickson and his nurse review and he will be called back with further instructions.

## 2019-01-03 NOTE — Telephone Encounter (Signed)
I will route this call to Triage for further evaluation as to BP is 202/111 per pt call.

## 2019-01-14 ENCOUNTER — Other Ambulatory Visit: Payer: Self-pay | Admitting: Cardiovascular Disease

## 2019-01-14 DIAGNOSIS — D485 Neoplasm of uncertain behavior of skin: Secondary | ICD-10-CM | POA: Diagnosis not present

## 2019-01-14 DIAGNOSIS — L089 Local infection of the skin and subcutaneous tissue, unspecified: Secondary | ICD-10-CM | POA: Diagnosis not present

## 2019-01-14 DIAGNOSIS — L309 Dermatitis, unspecified: Secondary | ICD-10-CM | POA: Diagnosis not present

## 2019-01-14 DIAGNOSIS — B9689 Other specified bacterial agents as the cause of diseases classified elsewhere: Secondary | ICD-10-CM | POA: Diagnosis not present

## 2019-01-25 ENCOUNTER — Ambulatory Visit: Payer: Medicare Other | Admitting: *Deleted

## 2019-01-25 ENCOUNTER — Other Ambulatory Visit: Payer: Self-pay

## 2019-01-28 ENCOUNTER — Telehealth: Payer: Self-pay

## 2019-01-28 NOTE — Telephone Encounter (Signed)
Spoke with patient to remind of missed remote transmission 

## 2019-01-29 ENCOUNTER — Ambulatory Visit (INDEPENDENT_AMBULATORY_CARE_PROVIDER_SITE_OTHER): Payer: Medicare Other | Admitting: *Deleted

## 2019-01-29 DIAGNOSIS — I442 Atrioventricular block, complete: Secondary | ICD-10-CM | POA: Diagnosis not present

## 2019-01-30 LAB — CUP PACEART REMOTE DEVICE CHECK
Date Time Interrogation Session: 20200325151803
Implantable Lead Implant Date: 20180919
Implantable Lead Location: 753859
Implantable Lead Model: 5076
Implantable Lead Model: 5076
Implantable Pulse Generator Implant Date: 20180919
MDC IDC LEAD IMPLANT DT: 20180919
MDC IDC LEAD LOCATION: 753860

## 2019-02-04 NOTE — Progress Notes (Signed)
Remote pacemaker transmission.   

## 2019-02-05 ENCOUNTER — Other Ambulatory Visit: Payer: Self-pay

## 2019-02-07 DIAGNOSIS — D485 Neoplasm of uncertain behavior of skin: Secondary | ICD-10-CM | POA: Diagnosis not present

## 2019-02-07 DIAGNOSIS — L309 Dermatitis, unspecified: Secondary | ICD-10-CM | POA: Diagnosis not present

## 2019-02-07 DIAGNOSIS — L821 Other seborrheic keratosis: Secondary | ICD-10-CM | POA: Diagnosis not present

## 2019-02-13 DIAGNOSIS — C4491 Basal cell carcinoma of skin, unspecified: Secondary | ICD-10-CM | POA: Diagnosis not present

## 2019-02-14 DIAGNOSIS — L309 Dermatitis, unspecified: Secondary | ICD-10-CM | POA: Diagnosis not present

## 2019-02-14 DIAGNOSIS — C44310 Basal cell carcinoma of skin of unspecified parts of face: Secondary | ICD-10-CM | POA: Diagnosis not present

## 2019-02-14 DIAGNOSIS — C44519 Basal cell carcinoma of skin of other part of trunk: Secondary | ICD-10-CM | POA: Diagnosis not present

## 2019-02-14 DIAGNOSIS — Z4802 Encounter for removal of sutures: Secondary | ICD-10-CM | POA: Diagnosis not present

## 2019-02-27 DIAGNOSIS — E291 Testicular hypofunction: Secondary | ICD-10-CM | POA: Diagnosis not present

## 2019-03-01 DIAGNOSIS — K648 Other hemorrhoids: Secondary | ICD-10-CM | POA: Diagnosis not present

## 2019-03-01 DIAGNOSIS — Z86010 Personal history of colonic polyps: Secondary | ICD-10-CM | POA: Diagnosis not present

## 2019-03-01 DIAGNOSIS — K6289 Other specified diseases of anus and rectum: Secondary | ICD-10-CM | POA: Diagnosis not present

## 2019-03-07 DIAGNOSIS — E291 Testicular hypofunction: Secondary | ICD-10-CM | POA: Diagnosis not present

## 2019-03-15 ENCOUNTER — Telehealth: Payer: Self-pay

## 2019-03-15 NOTE — Telephone Encounter (Signed)
Attempted to call pt, there was no answer and sounded as if pt picked up the phone and began dialing numbers.

## 2019-03-16 ENCOUNTER — Other Ambulatory Visit: Payer: Self-pay | Admitting: Cardiovascular Disease

## 2019-03-21 DIAGNOSIS — E291 Testicular hypofunction: Secondary | ICD-10-CM | POA: Diagnosis not present

## 2019-03-26 DIAGNOSIS — C44310 Basal cell carcinoma of skin of unspecified parts of face: Secondary | ICD-10-CM | POA: Diagnosis not present

## 2019-03-27 NOTE — Progress Notes (Signed)
Virtual Visit via Video Note   This visit type was conducted due to national recommendations for restrictions regarding the COVID-19 Pandemic (e.g. social distancing) in an effort to limit this patient's exposure and mitigate transmission in our community.  Due to his co-morbid illnesses, this patient is at least at moderate risk for complications without adequate follow up.  This format is felt to be most appropriate for this patient at this time.  All issues noted in this document were discussed and addressed.  A limited physical exam was performed with this format.  Please refer to the patient's chart for his consent to telehealth for [facility].   Date:  03/28/2019   ID:  Michael Barton, DOB Oct 30, 1946, MRN [id number]  Patient Location: Home Provider Location: Office  PCP:  Gaynelle Arabian, MD  Cardiologist:  Mertie Moores, MD  Electrophysiologist:  None   Evaluation Performed:  Follow-Up Visit  Problem LIst  Chief Complaint  Patient presents with  . Follow-up    HOCM    1. Shortness of breath 2. Hyperlipidemia 3. Obstructive sleep apnea 4. Depression    History of Present Illness: Michael Barton is a 73 y.o. male who presents for DOE Was an avid runner and cyclist until 5 years ago. Is not able to run or cycle recently - has been a steady decline  He can now run for 1 minute and then walk for 2-3   He has noticed a definint decline in his abiltiy - now gets short of breath doing everyday activities. He become short of breath climbing stairs and walking from his office out to the car.  He denies any chest pain. He has no shortness of breath at rest. He's had an echo card gram the past which reveals left ventricular hypertrophy.  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 A stress Myoview study showed no ischemia. His ejection fraction was 58%.  Michael Barton  has not improved.   Has not had his echo yet.   Aug. 18, 2017: Michael Barton is seen for his dyspnea with exertion .    Myoview Mar 28, 2016 - no ischemia.  EF 58% Echo - June 2017:   Normal LV dysfunction .   Mild dynamic LV obstruction   Cardiopulmonary stress test:    Overall normal function capacity  He has been started on Coreg - has been increased to 6.25 BID   Can walk and cycle witout problems Has DOE with running    Nov. 27, 2017:  Michael Barton Is seen back for follow-up office visit. He had an echocardiogram which reveals a  Slight dynamic obstruction with LVH.   We increased his Coreg to 6.25 BID , And he has not noticed any significant improvement in his exertional dyspnea.  Feb. 26,  2018  Michael Barton is slightly worse. Has some symptoms or orthostasis.  The change from Coreg to Metoprolol did not help  Still hiking - especially in the warmer months   March 01, 2017:   Michael Barton is seen back today  He had stopped the metoprolol prior to getting the MRI.  Still cant walk very far without getting severe shortness of breath .    Sept. 27, 2018:  Michael Barton is seen today for follow up  He has had an alcohol septal ablation at [facility] for his HOCM.  He had an episode of syncope the week  after the ablation  And was found to have  complete AV block    He had a pacer placed, he did well but had a brief episode of syncope and was re-hospitalized .   Was found to have orthostasis .   Is feeling much better.   No further episodes of syncope  Is here for wound check and pacer check and to see me for follow up.    September 27, 2017  Michael Barton is doing well.     Chief Complaint:  HOCM   Mar 28, 2019    Michael Barton is a 73 y.o. male with  Hx of HOCM .  He is s/p ETOH septal ablation at [facility] by Dr. Derinda Sis.  He developed complete heart block shortly after the procedure and required emergent pacer placement .   Is back running - ran 20 minutes . Working out at [employer]  previous to the covid issue Has been running about 20 minutes at a time without any problems No dizziness.    BP and HR look great No cp or dyspnea.   He needs to have a colonoscpy He is at low risk for his colonoscopy  The patient does not have symptoms concerning for COVID-19 infection (fever, chills, cough, or new shortness of breath).    Past Medical History:  Diagnosis Date  . Depression   . History of orchiectomy   . HOCM (hypertrophic obstructive cardiomyopathy) ([place]) 06/24/2016   cMRI 3/18: Severe asymm septal hypertrophy, EF 74, SAM, mod MR, turb flow sugg of significant LVOT gradient, picture c/w HOCM // Echo 6/17: Mild conc LVH, LVOT gradient 14, EF 65-70, mild MR // CP stress test 6/17: NL functional capacity compared to matched sedentary norms. Pt primarily limited by body habitus and deconditioning // Echo 9/12: EF 69.8, LVOT gradient 16   . HOCM (hypertrophic obstructive cardiomyopathy) ([place])   . Hyperlipidemia   . Sleep apnea   . Testicular cancer [facility])    Past Surgical History:  Procedure Laterality Date  . ORCHIECTOMY    . PACEMAKER IMPLANT N/A 07/26/2017   Procedure: Pacemaker Implant;  Surgeon: Evans Lance, MD;  Location: [place] [facility] LAB;  Service: Cardiovascular;  Laterality: N/A;  . RIGHT/LEFT HEART CATH AND CORONARY ANGIOGRAPHY N/A 04/10/2017   Procedure: Right/Left Heart Cath and Coronary Angiography;  Surgeon: Nelva Bush, MD;  Location: [place] [facility] LAB;  Service: Cardiovascular;  Laterality: N/A;  . TEMPORARY PACEMAKER N/A 07/25/2017   Procedure: Temporary Pacemaker;  Surgeon: Sherren Mocha, MD;  Location: [place] [facility] LAB;  Service: Cardiovascular;  Laterality: N/A;     Current Meds  Medication Sig  . aspirin EC 81 MG tablet Take 1 tablet (81 mg total) by mouth daily.  Marland Kitchen buPROPion (WELLBUTRIN SR) 100 MG 12 hr tablet Take 100 mg by mouth 2 (two) times daily.  Marland Kitchen ibuprofen (ADVIL,MOTRIN) 200 MG tablet Take 200 mg by mouth 2 (two) times a day.    . lamoTRIgine (LAMICTAL) 200 MG tablet Take 200 mg by mouth daily.  . metoprolol succinate (TOPROL-XL) 100 MG 24 hr tablet Take 1 tablet (100 mg total) by mouth daily. Please keep upcoming appt for future refills. Thank you  . pravastatin (PRAVACHOL) 40 MG tablet Take 40 mg by mouth daily.  . QUEtiapine (SEROQUEL) 200 MG tablet Take 200 mg by mouth at bedtime.  Marland Kitchen testosterone cypionate (DEPOTESTOSTERONE CYPIONATE) 200 MG/ML injection Inject 200 mg into the muscle every 14 (fourteen) days.  Marland Kitchen venlafaxine  XR (EFFEXOR-XR) 150 MG 24 hr capsule Take 150 mg by mouth daily with breakfast.     Allergies:   Ambien [zolpidem tartrate]; Ambien [zolpidem tartrate]; Prozac [fluoxetine hcl]; and Prozac [fluoxetine]   Social History   Tobacco Use  . Smoking status: Never Smoker  . Smokeless tobacco: Never Used  Substance Use Topics  . Alcohol use: No  . Drug use: No     Family Hx: The patient's family history includes Cancer in his father; Depression in his mother; Thyroid disease in his mother.  ROS:   Please see the history of present illness.     All other systems reviewed and are negative.   Prior CV studies:   The following studies were reviewed today:    Labs/Other Tests and Data Reviewed:    EKG:  No ECG reviewed.  Recent Labs: 10/02/2018: B Natriuretic Peptide 50.1; BUN 18; Creatinine, Ser 1.71; Hemoglobin 13.5; Platelets 206; Potassium 3.9; Sodium 133   Recent Lipid Panel Lab Results  Component Value Date/Time   CHOL 157 07/25/2017 11:45 AM   TRIG 85 07/25/2017 11:45 AM   HDL 53 07/25/2017 11:45 AM   CHOLHDL 3.0 07/25/2017 11:45 AM   LDLCALC 87 07/25/2017 11:45 AM    Wt Readings from Last 3 Encounters:  03/28/19 212 lb (96.2 kg)  10/02/18 212 lb (96.2 kg)  07/27/18 217 lb (98.4 kg)     Objective:    Vital Signs:  BP 118/82 (BP Location: Left Arm, Patient Position: Sitting, Cuff Size: Normal)   Pulse 90   Ht 6\' 3"  (1.905 m)   Wt 212 lb (96.2 kg)   BMI 26.50  kg/m    VITAL SIGNS:  reviewed GEN:  no acute distress EYES:  sclerae anicteric, EOMI - Extraocular Movements Intact RESPIRATORY:  normal respiratory effort, symmetric expansion CARDIOVASCULAR:  no peripheral edema SKIN:  no rash, lesions or ulcers. MUSCULOSKELETAL:  no obvious deformities. NEURO:  alert and oriented x 3, no obvious focal deficit PSYCH:  normal affect  ASSESSMENT & PLAN:    1. HOCM  :  Doing great .   Has even started running again.  No symptoms.    Will need a follow up echo next year. Office visit in 1 year He needs to have a colonoscopy.   He is at low risk for his upcoming colonoscopy .    COVID-19 Education: The signs and symptoms of COVID-19 were discussed with the patient and how to seek care for testing (follow up with PCP or arrange E-visit).  \The importance of social distancing was discussed today.  Time:   Today, I have spent  17  minutes with the patient with telehealth technology discussing the above problems.     Medication Adjustments/Labs and Tests Ordered: Current medicines are reviewed at length with the patient today.  Concerns regarding medicines are outlined above.   Tests Ordered: No orders of the defined types were placed in this encounter.   Medication Changes: No orders of the defined types were placed in this encounter.   Disposition:  Follow up in 1 year(s)  Signed, Mertie Moores, MD  03/28/2019 11:51 AM    [facility]

## 2019-03-28 ENCOUNTER — Ambulatory Visit: Payer: Medicare Other | Admitting: Cardiovascular Disease

## 2019-03-28 ENCOUNTER — Telehealth (INDEPENDENT_AMBULATORY_CARE_PROVIDER_SITE_OTHER): Payer: Medicare Other | Admitting: Cardiovascular Disease

## 2019-03-28 ENCOUNTER — Encounter: Payer: Self-pay | Admitting: Cardiovascular Disease

## 2019-03-28 ENCOUNTER — Other Ambulatory Visit: Payer: Self-pay

## 2019-03-28 DIAGNOSIS — I421 Obstructive hypertrophic cardiomyopathy: Secondary | ICD-10-CM | POA: Diagnosis not present

## 2019-03-28 NOTE — Patient Instructions (Signed)

## 2019-03-29 DIAGNOSIS — L821 Other seborrheic keratosis: Secondary | ICD-10-CM | POA: Diagnosis not present

## 2019-04-04 DIAGNOSIS — E291 Testicular hypofunction: Secondary | ICD-10-CM | POA: Diagnosis not present

## 2019-04-06 DIAGNOSIS — L309 Dermatitis, unspecified: Secondary | ICD-10-CM | POA: Diagnosis not present

## 2019-04-06 DIAGNOSIS — L821 Other seborrheic keratosis: Secondary | ICD-10-CM | POA: Diagnosis not present

## 2019-04-15 ENCOUNTER — Telehealth: Payer: Self-pay

## 2019-04-15 NOTE — Telephone Encounter (Signed)
    [facility] Pre-operative Risk Assessment    Request for surgical clearance:  1. What type of surgery is being performed? Colonoscopy   2. When is this surgery scheduled? 05/21/19   3. What type of clearance is required (medical clearance vs. Pharmacy clearance to hold med vs. Both)? Medical  4. Are there any medications that need to be held prior to surgery and how long? No   5. Practice name and name of physician performing surgery? Dr. Evette Doffing Schooler/[facility]   6. What is your office phone number [phone]    7.   What is your office fax number [phone]  8.   Anesthesia type (None, local, MAC, general) ? Propofol   Michael Barton 04/15/2019, 11:01 AM  _________________________________________________________________   (provider comments below)

## 2019-04-15 NOTE — Telephone Encounter (Signed)
   Primary Cardiologist: Mertie Moores, MD  Chart reviewed as part of pre-operative protocol coverage. Pt was just evaluated by Dr. Acie Fredrickson on 03/28/19 and he addressed colonoscopy. He outlined in note that he is low risk and ok to proceed.  Given past medical history and time since last visit, based on ACC/AHA guidelines, JADIAN KARMAN would be at acceptable risk for the planned procedure without further cardiovascular testing.   I will route this recommendation to the requesting party via Epic fax function and remove from pre-op pool.  Please call with questions.  Lyda Jester, PA-C 04/15/2019, 12:15 PM

## 2019-04-18 DIAGNOSIS — E291 Testicular hypofunction: Secondary | ICD-10-CM | POA: Diagnosis not present

## 2019-04-26 DIAGNOSIS — B078 Other viral warts: Secondary | ICD-10-CM | POA: Diagnosis not present

## 2019-04-26 DIAGNOSIS — C44519 Basal cell carcinoma of skin of other part of trunk: Secondary | ICD-10-CM | POA: Diagnosis not present

## 2019-04-26 DIAGNOSIS — L308 Other specified dermatitis: Secondary | ICD-10-CM | POA: Diagnosis not present

## 2019-04-26 DIAGNOSIS — D235 Other benign neoplasm of skin of trunk: Secondary | ICD-10-CM | POA: Diagnosis not present

## 2019-04-30 ENCOUNTER — Encounter: Payer: Medicare Other | Admitting: *Deleted

## 2019-05-01 ENCOUNTER — Telehealth: Payer: Self-pay

## 2019-05-01 DIAGNOSIS — B078 Other viral warts: Secondary | ICD-10-CM | POA: Diagnosis not present

## 2019-05-01 NOTE — Telephone Encounter (Signed)
Left message for patient to remind of missed remote transmission.  

## 2019-05-02 DIAGNOSIS — E291 Testicular hypofunction: Secondary | ICD-10-CM | POA: Diagnosis not present

## 2019-05-16 DIAGNOSIS — G4733 Obstructive sleep apnea (adult) (pediatric): Secondary | ICD-10-CM | POA: Diagnosis not present

## 2019-05-17 DIAGNOSIS — E291 Testicular hypofunction: Secondary | ICD-10-CM | POA: Diagnosis not present

## 2019-05-21 DIAGNOSIS — K5730 Diverticulosis of large intestine without perforation or abscess without bleeding: Secondary | ICD-10-CM | POA: Diagnosis not present

## 2019-05-21 DIAGNOSIS — Z86010 Personal history of colonic polyps: Secondary | ICD-10-CM | POA: Diagnosis not present

## 2019-05-21 DIAGNOSIS — K640 First degree hemorrhoids: Secondary | ICD-10-CM | POA: Diagnosis not present

## 2019-05-21 DIAGNOSIS — K635 Polyp of colon: Secondary | ICD-10-CM | POA: Diagnosis not present

## 2019-05-23 DIAGNOSIS — C44519 Basal cell carcinoma of skin of other part of trunk: Secondary | ICD-10-CM | POA: Diagnosis not present

## 2019-05-24 DIAGNOSIS — K635 Polyp of colon: Secondary | ICD-10-CM | POA: Diagnosis not present

## 2019-05-28 DIAGNOSIS — L821 Other seborrheic keratosis: Secondary | ICD-10-CM | POA: Diagnosis not present

## 2019-06-04 DIAGNOSIS — E291 Testicular hypofunction: Secondary | ICD-10-CM | POA: Diagnosis not present

## 2019-06-18 DIAGNOSIS — E291 Testicular hypofunction: Secondary | ICD-10-CM | POA: Diagnosis not present

## 2019-06-24 ENCOUNTER — Other Ambulatory Visit: Payer: Self-pay | Admitting: Cardiovascular Disease

## 2019-06-24 DIAGNOSIS — C44519 Basal cell carcinoma of skin of other part of trunk: Secondary | ICD-10-CM | POA: Diagnosis not present

## 2019-06-24 DIAGNOSIS — L918 Other hypertrophic disorders of the skin: Secondary | ICD-10-CM | POA: Diagnosis not present

## 2019-06-25 DIAGNOSIS — E291 Testicular hypofunction: Secondary | ICD-10-CM | POA: Diagnosis not present

## 2019-07-02 DIAGNOSIS — E291 Testicular hypofunction: Secondary | ICD-10-CM | POA: Diagnosis not present

## 2019-07-16 DIAGNOSIS — E291 Testicular hypofunction: Secondary | ICD-10-CM | POA: Diagnosis not present

## 2019-07-30 DIAGNOSIS — E291 Testicular hypofunction: Secondary | ICD-10-CM | POA: Diagnosis not present

## 2019-08-13 DIAGNOSIS — E291 Testicular hypofunction: Secondary | ICD-10-CM | POA: Diagnosis not present

## 2019-08-22 DIAGNOSIS — C44519 Basal cell carcinoma of skin of other part of trunk: Secondary | ICD-10-CM | POA: Diagnosis not present

## 2019-08-27 DIAGNOSIS — F322 Major depressive disorder, single episode, severe without psychotic features: Secondary | ICD-10-CM | POA: Diagnosis not present

## 2019-08-27 DIAGNOSIS — E291 Testicular hypofunction: Secondary | ICD-10-CM | POA: Diagnosis not present

## 2019-08-27 DIAGNOSIS — E7800 Pure hypercholesterolemia, unspecified: Secondary | ICD-10-CM | POA: Diagnosis not present

## 2019-08-27 DIAGNOSIS — N183 Chronic kidney disease, stage 3 unspecified: Secondary | ICD-10-CM | POA: Diagnosis not present

## 2019-08-27 DIAGNOSIS — I421 Obstructive hypertrophic cardiomyopathy: Secondary | ICD-10-CM | POA: Diagnosis not present

## 2019-08-27 DIAGNOSIS — Z950 Presence of cardiac pacemaker: Secondary | ICD-10-CM | POA: Diagnosis not present

## 2019-08-27 DIAGNOSIS — Z0000 Encounter for general adult medical examination without abnormal findings: Secondary | ICD-10-CM | POA: Diagnosis not present

## 2019-08-27 DIAGNOSIS — G4733 Obstructive sleep apnea (adult) (pediatric): Secondary | ICD-10-CM | POA: Diagnosis not present

## 2019-08-27 DIAGNOSIS — I495 Sick sinus syndrome: Secondary | ICD-10-CM | POA: Diagnosis not present

## 2019-08-27 DIAGNOSIS — Z1389 Encounter for screening for other disorder: Secondary | ICD-10-CM | POA: Diagnosis not present

## 2019-08-28 DIAGNOSIS — E291 Testicular hypofunction: Secondary | ICD-10-CM | POA: Diagnosis not present

## 2019-08-29 DIAGNOSIS — C4491 Basal cell carcinoma of skin, unspecified: Secondary | ICD-10-CM | POA: Diagnosis not present

## 2019-08-30 DIAGNOSIS — Z23 Encounter for immunization: Secondary | ICD-10-CM | POA: Diagnosis not present

## 2019-08-30 DIAGNOSIS — N183 Chronic kidney disease, stage 3 unspecified: Secondary | ICD-10-CM | POA: Diagnosis not present

## 2019-08-30 DIAGNOSIS — E7800 Pure hypercholesterolemia, unspecified: Secondary | ICD-10-CM | POA: Diagnosis not present

## 2019-09-13 DIAGNOSIS — E291 Testicular hypofunction: Secondary | ICD-10-CM | POA: Diagnosis not present

## 2019-09-26 DIAGNOSIS — E291 Testicular hypofunction: Secondary | ICD-10-CM | POA: Diagnosis not present

## 2019-09-30 DIAGNOSIS — H524 Presbyopia: Secondary | ICD-10-CM | POA: Diagnosis not present

## 2019-09-30 DIAGNOSIS — H25093 Other age-related incipient cataract, bilateral: Secondary | ICD-10-CM | POA: Diagnosis not present

## 2019-10-01 ENCOUNTER — Other Ambulatory Visit: Payer: Self-pay | Admitting: Cardiovascular Disease

## 2019-10-01 DIAGNOSIS — R948 Abnormal results of function studies of other organs and systems: Secondary | ICD-10-CM | POA: Diagnosis not present

## 2019-10-01 DIAGNOSIS — E291 Testicular hypofunction: Secondary | ICD-10-CM | POA: Diagnosis not present

## 2019-10-07 DIAGNOSIS — T8131XD Disruption of external operation (surgical) wound, not elsewhere classified, subsequent encounter: Secondary | ICD-10-CM | POA: Diagnosis not present

## 2019-10-07 DIAGNOSIS — E291 Testicular hypofunction: Secondary | ICD-10-CM | POA: Diagnosis not present

## 2019-10-07 DIAGNOSIS — S60921A Unspecified superficial injury of right hand, initial encounter: Secondary | ICD-10-CM | POA: Diagnosis not present

## 2019-10-07 DIAGNOSIS — L308 Other specified dermatitis: Secondary | ICD-10-CM | POA: Diagnosis not present

## 2019-10-10 DIAGNOSIS — E291 Testicular hypofunction: Secondary | ICD-10-CM | POA: Diagnosis not present

## 2019-10-17 DIAGNOSIS — F319 Bipolar disorder, unspecified: Secondary | ICD-10-CM | POA: Diagnosis not present

## 2019-10-21 DIAGNOSIS — L308 Other specified dermatitis: Secondary | ICD-10-CM | POA: Diagnosis not present

## 2019-10-21 DIAGNOSIS — T8131XD Disruption of external operation (surgical) wound, not elsewhere classified, subsequent encounter: Secondary | ICD-10-CM | POA: Diagnosis not present

## 2019-10-21 DIAGNOSIS — S60921D Unspecified superficial injury of right hand, subsequent encounter: Secondary | ICD-10-CM | POA: Diagnosis not present

## 2019-12-21 DIAGNOSIS — Z23 Encounter for immunization: Secondary | ICD-10-CM | POA: Diagnosis not present

## 2019-12-30 DIAGNOSIS — E291 Testicular hypofunction: Secondary | ICD-10-CM | POA: Diagnosis not present

## 2020-01-03 ENCOUNTER — Other Ambulatory Visit: Payer: Self-pay | Admitting: Cardiovascular Disease

## 2020-01-18 DIAGNOSIS — Z23 Encounter for immunization: Secondary | ICD-10-CM | POA: Diagnosis not present

## 2020-01-23 DIAGNOSIS — L308 Other specified dermatitis: Secondary | ICD-10-CM | POA: Diagnosis not present

## 2020-03-23 DIAGNOSIS — E291 Testicular hypofunction: Secondary | ICD-10-CM | POA: Diagnosis not present

## 2020-03-23 DIAGNOSIS — R948 Abnormal results of function studies of other organs and systems: Secondary | ICD-10-CM | POA: Diagnosis not present

## 2020-04-01 ENCOUNTER — Other Ambulatory Visit: Payer: Self-pay | Admitting: Cardiovascular Disease

## 2020-04-02 ENCOUNTER — Encounter: Payer: Self-pay | Admitting: Cardiovascular Disease

## 2020-04-02 ENCOUNTER — Other Ambulatory Visit: Payer: Self-pay

## 2020-04-02 ENCOUNTER — Ambulatory Visit (INDEPENDENT_AMBULATORY_CARE_PROVIDER_SITE_OTHER): Payer: Medicare Other | Admitting: Cardiovascular Disease

## 2020-04-02 VITALS — BP 140/80 | HR 64 | Ht 75.0 in | Wt 206.0 lb

## 2020-04-02 DIAGNOSIS — I421 Obstructive hypertrophic cardiomyopathy: Secondary | ICD-10-CM | POA: Diagnosis not present

## 2020-04-02 DIAGNOSIS — I442 Atrioventricular block, complete: Secondary | ICD-10-CM

## 2020-04-02 MED ORDER — METOPROLOL SUCCINATE ER 100 MG PO TB24: 100.0000 mg | ORAL_TABLET | Freq: Every day | ORAL | 0 refills | Status: DC

## 2020-04-02 NOTE — Patient Instructions (Signed)
Medication Instructions:  Your physician recommends that you continue on your current medications as directed. Please refer to the Current Medication list given to you today.  *If you need a refill on your cardiac medications before your next appointment, please call your pharmacy*   Lab Work: None Ordered If you have labs (blood work) drawn today and your tests are completely normal, you will receive your results only by: Marland Kitchen MyChart Message (if you have MyChart) OR . A paper copy in the mail If you have any lab test that is abnormal or we need to change your treatment, we will call you to review the results.   Testing/Procedures: None Ordered   Follow-Up: At [facility], you and your health needs are our priority.  As part of our continuing mission to provide you with exceptional heart care, we have created designated Provider Care Teams.  These Care Teams include your primary Cardiologist (physician) and Advanced Practice Providers (APPs -  Physician Assistants and Nurse Practitioners) who all work together to provide you with the care you need, when you need it.     Your next appointment:   1 year(s)  The format for your next appointment:   In Person  Provider:   You may see Mertie Moores, MD or one of the following Advanced Practice Providers on your designated Care Team:    Richardson Dopp, PA-C  Robbie Lis, [place]    Other Instructions You are overdue to see Dr. Lovena Le. Dr. Tanna Furry scheduler will call you to set up an appointment.

## 2020-04-02 NOTE — Progress Notes (Signed)
Cardiology Office Note   Date:  04/02/2020   ID:  GRAYSIN PELAYO, DOB [birth year], MRN [id number]  PCP:  Gaynelle Arabian, MD  Cardiologist:   Mertie Moores, MD   Chief Complaint  Patient presents with  . Cardiomyopathy   1. Shortness of breath 2. Hyperlipidemia 3. Obstructive sleep apnea 4. Depression    History of Present Illness: Michael Barton is Barton 74 y.o. male who presents for DOE Was an avid runner and cyclist until 5 years ago. Is not able to run or cycle recently - has been Barton steady decline  He can now run for 1 minute and then walk for 2-3   He has noticed Barton definint decline in his abiltiy - now gets short of breath doing everyday activities. He become short of breath climbing stairs and walking from his office out to the car.  He denies any chest pain. He has no shortness of breath at rest. He's had an echo card gram the past which reveals left ventricular hypertrophy.  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 Barton stress Myoview study showed no ischemia. His ejection fraction was 58%.  Aydrien has not improved.   Has not had his echo yet.   Aug. 18, 2017: Michael Barton is seen for his dyspnea with exertion .    Myoview Mar 28, 2016 - no ischemia.  EF 58% Echo - June 2017:   Normal LV dysfunction .   Mild dynamic LV obstruction   Cardiopulmonary stress test:    Overall normal function capacity  He has been started on Coreg - has been increased to 6.25 BID   Can walk and cycle witout problems Has DOE with running    Nov. 27, 2017:  Michael Barton Is seen back for follow-up office visit. He had an echocardiogram which reveals Barton  Slight dynamic obstruction with LVH.   We increased his Coreg to 6.25 BID , And he has not noticed any significant improvement in his exertional dyspnea.  Feb. 26,  2018  Michael Barton is slightly worse. Has some symptoms or orthostasis.  The change from  Coreg to Metoprolol did not help  Still hiking - especially in the warmer months   March 01, 2017:   Michael Barton is seen back today  He had stopped the metoprolol prior to getting the MRI.  Still cant walk very far without getting severe shortness of breath .    Sept. 27, 2018:  Michael Barton is seen today for follow up  He has had an alcohol septal ablation at [facility] for his HOCM.  He had an episode of syncope the week after the ablation  And was found to have  complete AV block    He had Barton pacer placed, he did well but had Barton brief episode of syncope and was re-hospitalized .   Was found to have orthostasis .   Is feeling much better.   No further episodes of syncope  Is here for wound check and pacer check and to see me for follow up.    September 27, 2017  Michael Barton is doing well.  No CP or dyspnea.  No further episdoes of syncope since getting the pacer   Apr 02, 2020:  Michael Barton is seen today for follow up of his HOCM.  He is s/p alcohol ablation ([facility]) and pacer placement. His visit last year was by telemedicine.  Still is very active.  He hikes on Barton regular basis.  He is put in Barton garden ( squash, peppers, tomatoes, okra, zuchinni) .  He works out on the treadmill on Barton regular basis.  Has lost 6 lbs.      Past Medical History:  Diagnosis Date  . Depression   . History of orchiectomy   . HOCM (hypertrophic obstructive cardiomyopathy) ([place]) 06/24/2016   cMRI 3/18: Severe asymm septal hypertrophy, EF 74, SAM, mod MR, turb flow sugg of significant LVOT gradient, picture c/w HOCM // Echo 6/17: Mild conc LVH, LVOT gradient 14, EF 65-70, mild MR // CP stress test 6/17: NL functional capacity compared to matched sedentary norms. Pt primarily limited by body habitus and deconditioning // Echo 9/12: EF 69.8, LVOT gradient 16   . HOCM (hypertrophic obstructive cardiomyopathy) ([place])   . Hyperlipidemia   . Sleep apnea   . Testicular cancer [facility])     Past Surgical History:  Procedure Laterality Date  .  ORCHIECTOMY    . PACEMAKER IMPLANT N/Barton 07/26/2017   Procedure: Pacemaker Implant;  Surgeon: Evans Lance, MD;  Location: [place] [facility];  Service: Cardiovascular;  Laterality: N/Barton;  . RIGHT/LEFT HEART CATH AND CORONARY ANGIOGRAPHY N/Barton 04/10/2017   Procedure: Right/Left Heart Cath and Coronary Angiography;  Surgeon: Nelva Bush, MD;  Location: [place] [facility];  Service: Cardiovascular;  Laterality: N/Barton;  . TEMPORARY PACEMAKER N/Barton 07/25/2017   Procedure: Temporary Pacemaker;  Surgeon: Sherren Mocha, MD;  Location: [place] [facility];  Service: Cardiovascular;  Laterality: N/Barton;     Current Outpatient Medications  Medication Sig Dispense Refill  . aspirin EC 81 MG tablet Take 1 tablet (81 mg total) by mouth daily.    Marland Kitchen buPROPion (WELLBUTRIN SR) 100 MG 12 hr tablet Take 100 mg by mouth 2 (two) times daily.    Marland Kitchen ibuprofen (ADVIL,MOTRIN) 200 MG tablet Take 200 mg by mouth 2 (two) times Barton day.     . lamoTRIgine (LAMICTAL) 200 MG tablet Take 200 mg by mouth daily.    . metoprolol succinate (TOPROL-XL) 100 MG 24 hr tablet Take 1 tablet (100 mg total) by mouth daily. Take with or immediately following Barton meal. Please keep upcoming appt in May for future refills. Thank you 90 tablet 0  . pravastatin (PRAVACHOL) 40 MG tablet Take 40 mg by mouth daily.    . QUEtiapine (SEROQUEL) 200 MG tablet Take 200 mg by mouth at bedtime.    . Testosterone 25 MG/2.5GM (1%) GEL Place onto the skin.    Marland Kitchen venlafaxine XR (EFFEXOR-XR) 150 MG 24 hr capsule Take 150 mg by mouth daily with breakfast.     No current facility-administered medications for this visit.    Allergies:   Ambien [zolpidem tartrate], Ambien [zolpidem tartrate], Prozac [fluoxetine hcl], and Prozac [fluoxetine]    Social History:  The patient  reports that he has never smoked. He has never used smokeless tobacco. He reports that he does not drink alcohol or use drugs.   Family History:  The patient's family history includes Cancer in his  father; Depression in his mother; Thyroid disease in his mother.    ROS:  Please see the history of present illness.     All other systems are reviewed and negative.   Physical Exam: Blood pressure 140/80, pulse 64, height 6\' 3"  (1.905 m), weight 206 lb (93.4 kg).  GEN:  Well nourished, well developed in no acute distress HEENT: Normal NECK: No JVD; No  carotid bruits LYMPHATICS: No lymphadenopathy CARDIAC: RRR , no murmurs, rubs, gallops RESPIRATORY:  Clear to auscultation without rales, wheezing or rhonchi  ABDOMEN: Soft, non-tender, non-distended MUSCULOSKELETAL:  No edema; No deformity  SKIN: Warm and dry NEUROLOGIC:  Alert and oriented x 3   EKG: Apr 02, 2020: Normal sinus rhythm at 65.   right bundle branch block.  Recent Labs: No results found for requested labs within last 8760 hours.    Lipid Panel    Component Value Date/Time   CHOL 157 07/25/2017 1145   TRIG 85 07/25/2017 1145   HDL 53 07/25/2017 1145   CHOLHDL 3.0 07/25/2017 1145   VLDL 17 07/25/2017 1145   LDLCALC 87 07/25/2017 1145      Wt Readings from Last 3 Encounters:  04/02/20 206 lb (93.4 kg)  03/28/19 212 lb (96.2 kg)  10/02/18 212 lb (96.2 kg)      Other studies Reviewed: Additional studies/ records that were reviewed today include: . Review of the above records demonstrates:    ASSESSMENT AND PLAN:  1.  Dynamic left ventricular outflow tract obstruction -no significant murmur heard today.  He status post alcohol ablation.  Seems to be doing very well.  He does not have any murmur today.    2. Complete heart block: Status post pacemaker implantation.  This is being followed remotely by our device clinic.  3.   Orthostatic hypotension :   He is not had any symptoms of orthostatic hypotension.  Continue current medications.    Current medicines are reviewed at length with the patient today.  The patient does not have concerns regarding medicines.  The following changes have been  made:  no change  Labs/ tests ordered today include:   No orders of the defined types were placed in this encounter.   Mertie Moores, MD  04/02/2020 5:24 PM    [address], [place], [place]  [id number] Phone: [phone]; Fax: [phone]

## 2020-04-07 NOTE — Addendum Note (Signed)
Addended by: Emmaline Life on: 04/07/2020 08:04 AM   Modules accepted: Orders

## 2020-04-08 ENCOUNTER — Other Ambulatory Visit: Payer: Self-pay

## 2020-04-08 ENCOUNTER — Encounter: Payer: Self-pay | Admitting: Internal Medicine

## 2020-04-08 ENCOUNTER — Ambulatory Visit (INDEPENDENT_AMBULATORY_CARE_PROVIDER_SITE_OTHER): Payer: Medicare Other | Admitting: Internal Medicine

## 2020-04-08 VITALS — BP 122/80 | HR 74 | Ht 75.0 in | Wt 205.0 lb

## 2020-04-08 DIAGNOSIS — Z950 Presence of cardiac pacemaker: Secondary | ICD-10-CM | POA: Diagnosis not present

## 2020-04-08 DIAGNOSIS — I421 Obstructive hypertrophic cardiomyopathy: Secondary | ICD-10-CM

## 2020-04-08 DIAGNOSIS — I442 Atrioventricular block, complete: Secondary | ICD-10-CM

## 2020-04-08 NOTE — Patient Instructions (Signed)

## 2020-04-08 NOTE — Progress Notes (Signed)
HPI Michael Barton returns today for followup. He is a pleasant 74 yo man with a h/o HCM, s/p ETOH septal ablation complicated by CHB. He underwent PPM insertion. In the interim, he has started back exercising and will walk for up to an hour at rates of over 4 mph. He denies chest pain or sob. No syncope. No edema.  Allergies  Allergen Reactions   Ambien [Zolpidem Tartrate]      GERD    Ambien [Zolpidem Tartrate] Other (See Comments)    GERD   Prozac [Fluoxetine Hcl]     Gives a weird feeling    Prozac [Fluoxetine] Other (See Comments)    "makes me feel weird"     Current Outpatient Medications  Medication Sig Dispense Refill   aspirin EC 81 MG tablet Take 1 tablet (81 mg total) by mouth daily.     buPROPion (WELLBUTRIN SR) 100 MG 12 hr tablet Take 100 mg by mouth 2 (two) times daily.     ibuprofen (ADVIL,MOTRIN) 200 MG tablet Take 200 mg by mouth 2 (two) times a day.      lamoTRIgine (LAMICTAL) 200 MG tablet Take 200 mg by mouth daily.     metoprolol succinate (TOPROL-XL) 100 MG 24 hr tablet Take 1 tablet (100 mg total) by mouth daily. Take with or immediately following a meal. Please keep upcoming appt in May for future refills. Thank you 90 tablet 0   pravastatin (PRAVACHOL) 40 MG tablet Take 40 mg by mouth daily.     QUEtiapine (SEROQUEL) 200 MG tablet Take 200 mg by mouth at bedtime.     Testosterone 25 MG/2.5GM (1%) GEL Place onto the skin.     venlafaxine XR (EFFEXOR-XR) 150 MG 24 hr capsule Take 150 mg by mouth daily with breakfast.     No current facility-administered medications for this visit.     Past Medical History:  Diagnosis Date   Depression    History of orchiectomy    HOCM (hypertrophic obstructive cardiomyopathy) ([place]) 06/24/2016   cMRI 3/18: Severe asymm septal hypertrophy, EF 74, SAM, mod MR, turb flow sugg of significant LVOT gradient, picture c/w HOCM // Echo 6/17: Mild conc LVH, LVOT gradient 14, EF 65-70, mild MR // CP stress test  6/17: NL functional capacity compared to matched sedentary norms. Pt primarily limited by body habitus and deconditioning // Echo 9/12: EF 69.8, LVOT gradient 16    HOCM (hypertrophic obstructive cardiomyopathy) ([facility])    Hyperlipidemia    Sleep apnea    Testicular cancer ([place])     ROS:   All systems reviewed and negative except as noted in the HPI.   Past Surgical History:  Procedure Laterality Date   ORCHIECTOMY     PACEMAKER IMPLANT N/A 07/26/2017   Procedure: Pacemaker Implant;  Surgeon: Evans Lance, MD;  Location: [place] [facility];  Service: Cardiovascular;  Laterality: N/A;   RIGHT/LEFT HEART CATH AND CORONARY ANGIOGRAPHY N/A 04/10/2017   Procedure: Right/Left Heart Cath and Coronary Angiography;  Surgeon: Nelva Bush, MD;  Location: [place] [facility];  Service: Cardiovascular;  Laterality: N/A;   TEMPORARY PACEMAKER N/A 07/25/2017   Procedure: Temporary Pacemaker;  Surgeon: Sherren Mocha, MD;  Location: [place] [facility];  Service: Cardiovascular;  Laterality: N/A;     Family History  Problem Relation Age of Onset   Thyroid disease Mother    Depression Mother    Cancer Father      Social History   Socioeconomic  History   Marital status: Single    Spouse name: Not on file   Number of children: Not on file   Years of education: Not on file   Highest education level: Not on file  Occupational History   Not on file  Tobacco Use   Smoking status: Never Smoker   Smokeless tobacco: Never Used  Substance and Sexual Activity   Alcohol use: No   Drug use: No   Sexual activity: Not on file  Other Topics Concern   Not on file  Social History Narrative   ** Merged History Encounter **       Social Determinants of Health   Financial Resource Strain:    Difficulty of Paying Living Expenses:   Food Insecurity:    Worried About Charity fundraiser in the Last Year:    Arboriculturist in the Last Year:   Transportation Needs:     Film/video editor (Medical):    Lack of Transportation (Non-Medical):   Physical Activity:    Days of Exercise per Week:    Minutes of Exercise per Session:   Stress:    Feeling of Stress :   Social Connections:    Frequency of Communication with Friends and Family:    Frequency of Social Gatherings with Friends and Family:    Attends Religious Services:    Active Member of Clubs or Organizations:    Attends Music therapist:    Marital Status:   Intimate Partner Violence:    Fear of Current or Ex-Partner:    Emotionally Abused:    Physically Abused:    Sexually Abused:      Ht 6\' 3"  (1.905 m)    Wt 205 lb (93 kg)    BMI 25.62 kg/m   Physical Exam:  Well appearing NAD HEENT: Unremarkable Neck:  No JVD, no thyromegally Lymphatics:  No adenopathy Back:  No CVA tenderness Lungs:  Clear HEART:  Regular rate rhythm, no murmurs, no rubs, no clicks Abd:  soft, positive bowel sounds, no organomegally, no rebound, no guarding Ext:  2 plus pulses, no edema, no cyanosis, no clubbing Skin:  No rashes no nodules Neuro:  CN II through XII intact, motor grossly intact  DEVICE  Normal device function.  See PaceArt for details.   Assess/Plan: 1. CHB - he is pacing minimally in the ventricle. He will continue his current meds. 2. PPM - his medtronic DDD PPM is working normally.  3. HCM - he is s/p ETOH septal ablation and is asymptomatic. No change. 4. HTN - his bp is well controlled. No change in meds. I have encouraged him to increase his activity.  Michael Barton, M.D.

## 2020-04-23 ENCOUNTER — Other Ambulatory Visit: Payer: Self-pay | Admitting: Cardiovascular Disease

## 2020-04-23 DIAGNOSIS — Z79899 Other long term (current) drug therapy: Secondary | ICD-10-CM | POA: Diagnosis not present

## 2020-04-23 DIAGNOSIS — F319 Bipolar disorder, unspecified: Secondary | ICD-10-CM | POA: Diagnosis not present

## 2020-04-24 MED ORDER — METOPROLOL SUCCINATE ER 100 MG PO TB24: 100.0000 mg | ORAL_TABLET | Freq: Every day | ORAL | 3 refills | Status: DC

## 2020-05-01 ENCOUNTER — Telehealth: Payer: Self-pay

## 2020-05-01 NOTE — Telephone Encounter (Signed)
Left message for patient to remind of missed remote transmission.  

## 2020-05-08 ENCOUNTER — Ambulatory Visit (INDEPENDENT_AMBULATORY_CARE_PROVIDER_SITE_OTHER): Payer: Medicare Other | Admitting: *Deleted

## 2020-05-08 DIAGNOSIS — I442 Atrioventricular block, complete: Secondary | ICD-10-CM

## 2020-05-08 LAB — CUP PACEART REMOTE DEVICE CHECK
Battery Remaining Longevity: 136 mo
Battery Voltage: 3.02 V
Brady Statistic AP VP Percent: 1.15 %
Brady Statistic AP VS Percent: 74.27 %
Brady Statistic AS VP Percent: 0.12 %
Brady Statistic AS VS Percent: 24.47 %
Brady Statistic RA Percent Paced: 75.52 %
Brady Statistic RV Percent Paced: 1.26 %
Date Time Interrogation Session: 20210701114637
Implantable Lead Implant Date: 20180919
Implantable Lead Implant Date: 20180919
Implantable Lead Location: 753859
Implantable Lead Location: 753860
Implantable Lead Model: 5076
Implantable Lead Model: 5076
Implantable Pulse Generator Implant Date: 20180919
Lead Channel Impedance Value: 285 Ohm
Lead Channel Impedance Value: 323 Ohm
Lead Channel Impedance Value: 437 Ohm
Lead Channel Impedance Value: 456 Ohm
Lead Channel Pacing Threshold Amplitude: 0.75 V
Lead Channel Pacing Threshold Amplitude: 1.5 V
Lead Channel Pacing Threshold Pulse Width: 0.4 ms
Lead Channel Pacing Threshold Pulse Width: 0.4 ms
Lead Channel Sensing Intrinsic Amplitude: 3.25 mV
Lead Channel Sensing Intrinsic Amplitude: 3.25 mV
Lead Channel Sensing Intrinsic Amplitude: 4.5 mV
Lead Channel Sensing Intrinsic Amplitude: 4.5 mV
Lead Channel Setting Pacing Amplitude: 2 V
Lead Channel Setting Pacing Amplitude: 2.5 V
Lead Channel Setting Pacing Pulse Width: 0.8 ms
Lead Channel Setting Sensing Sensitivity: 1.2 mV

## 2020-05-13 NOTE — Progress Notes (Signed)
Remote pacemaker transmission.   

## 2020-05-21 DIAGNOSIS — G4733 Obstructive sleep apnea (adult) (pediatric): Secondary | ICD-10-CM | POA: Diagnosis not present

## 2020-06-01 DIAGNOSIS — N183 Chronic kidney disease, stage 3 unspecified: Secondary | ICD-10-CM | POA: Diagnosis not present

## 2020-06-01 DIAGNOSIS — F322 Major depressive disorder, single episode, severe without psychotic features: Secondary | ICD-10-CM | POA: Diagnosis not present

## 2020-06-01 DIAGNOSIS — E7800 Pure hypercholesterolemia, unspecified: Secondary | ICD-10-CM | POA: Diagnosis not present

## 2020-07-06 DIAGNOSIS — E7800 Pure hypercholesterolemia, unspecified: Secondary | ICD-10-CM | POA: Diagnosis not present

## 2020-07-06 DIAGNOSIS — F322 Major depressive disorder, single episode, severe without psychotic features: Secondary | ICD-10-CM | POA: Diagnosis not present

## 2020-07-06 DIAGNOSIS — N183 Chronic kidney disease, stage 3 unspecified: Secondary | ICD-10-CM | POA: Diagnosis not present

## 2020-07-29 DIAGNOSIS — E291 Testicular hypofunction: Secondary | ICD-10-CM | POA: Diagnosis not present

## 2020-08-03 DIAGNOSIS — E291 Testicular hypofunction: Secondary | ICD-10-CM | POA: Diagnosis not present

## 2020-08-07 ENCOUNTER — Ambulatory Visit (INDEPENDENT_AMBULATORY_CARE_PROVIDER_SITE_OTHER): Payer: Medicare Other

## 2020-08-07 DIAGNOSIS — I442 Atrioventricular block, complete: Secondary | ICD-10-CM

## 2020-08-09 LAB — CUP PACEART REMOTE DEVICE CHECK
Battery Remaining Longevity: 132 mo
Battery Voltage: 3.02 V
Brady Statistic AP VP Percent: 3.88 %
Brady Statistic AP VS Percent: 69.76 %
Brady Statistic AS VP Percent: 0.16 %
Brady Statistic AS VS Percent: 26.2 %
Brady Statistic RA Percent Paced: 73.75 %
Brady Statistic RV Percent Paced: 4.04 %
Date Time Interrogation Session: 20211001131906
Implantable Lead Implant Date: 20180919
Implantable Lead Implant Date: 20180919
Implantable Lead Location: 753859
Implantable Lead Location: 753860
Implantable Lead Model: 5076
Implantable Lead Model: 5076
Implantable Pulse Generator Implant Date: 20180919
Lead Channel Impedance Value: 304 Ohm
Lead Channel Impedance Value: 342 Ohm
Lead Channel Impedance Value: 456 Ohm
Lead Channel Impedance Value: 475 Ohm
Lead Channel Pacing Threshold Amplitude: 0.75 V
Lead Channel Pacing Threshold Amplitude: 1.5 V
Lead Channel Pacing Threshold Pulse Width: 0.4 ms
Lead Channel Pacing Threshold Pulse Width: 0.4 ms
Lead Channel Sensing Intrinsic Amplitude: 3.375 mV
Lead Channel Sensing Intrinsic Amplitude: 3.375 mV
Lead Channel Sensing Intrinsic Amplitude: 7.125 mV
Lead Channel Sensing Intrinsic Amplitude: 7.125 mV
Lead Channel Setting Pacing Amplitude: 2 V
Lead Channel Setting Pacing Amplitude: 2.5 V
Lead Channel Setting Pacing Pulse Width: 0.8 ms
Lead Channel Setting Sensing Sensitivity: 1.2 mV

## 2020-08-10 NOTE — Progress Notes (Signed)
Remote pacemaker transmission.   

## 2020-08-26 DIAGNOSIS — N183 Chronic kidney disease, stage 3 unspecified: Secondary | ICD-10-CM | POA: Diagnosis not present

## 2020-08-26 DIAGNOSIS — F322 Major depressive disorder, single episode, severe without psychotic features: Secondary | ICD-10-CM | POA: Diagnosis not present

## 2020-08-26 DIAGNOSIS — E7800 Pure hypercholesterolemia, unspecified: Secondary | ICD-10-CM | POA: Diagnosis not present

## 2020-08-31 DIAGNOSIS — Z23 Encounter for immunization: Secondary | ICD-10-CM | POA: Diagnosis not present

## 2020-08-31 DIAGNOSIS — I421 Obstructive hypertrophic cardiomyopathy: Secondary | ICD-10-CM | POA: Diagnosis not present

## 2020-08-31 DIAGNOSIS — E291 Testicular hypofunction: Secondary | ICD-10-CM | POA: Diagnosis not present

## 2020-08-31 DIAGNOSIS — N183 Chronic kidney disease, stage 3 unspecified: Secondary | ICD-10-CM | POA: Diagnosis not present

## 2020-08-31 DIAGNOSIS — I495 Sick sinus syndrome: Secondary | ICD-10-CM | POA: Diagnosis not present

## 2020-08-31 DIAGNOSIS — Z950 Presence of cardiac pacemaker: Secondary | ICD-10-CM | POA: Diagnosis not present

## 2020-08-31 DIAGNOSIS — Z1389 Encounter for screening for other disorder: Secondary | ICD-10-CM | POA: Diagnosis not present

## 2020-08-31 DIAGNOSIS — E7800 Pure hypercholesterolemia, unspecified: Secondary | ICD-10-CM | POA: Diagnosis not present

## 2020-08-31 DIAGNOSIS — F322 Major depressive disorder, single episode, severe without psychotic features: Secondary | ICD-10-CM | POA: Diagnosis not present

## 2020-08-31 DIAGNOSIS — Z0000 Encounter for general adult medical examination without abnormal findings: Secondary | ICD-10-CM | POA: Diagnosis not present

## 2020-08-31 DIAGNOSIS — G4733 Obstructive sleep apnea (adult) (pediatric): Secondary | ICD-10-CM | POA: Diagnosis not present

## 2020-09-01 DIAGNOSIS — N183 Chronic kidney disease, stage 3 unspecified: Secondary | ICD-10-CM | POA: Diagnosis not present

## 2020-09-01 DIAGNOSIS — E7800 Pure hypercholesterolemia, unspecified: Secondary | ICD-10-CM | POA: Diagnosis not present

## 2020-09-11 DIAGNOSIS — Z23 Encounter for immunization: Secondary | ICD-10-CM | POA: Diagnosis not present

## 2020-09-28 DIAGNOSIS — H25093 Other age-related incipient cataract, bilateral: Secondary | ICD-10-CM | POA: Diagnosis not present

## 2020-09-28 DIAGNOSIS — H52223 Regular astigmatism, bilateral: Secondary | ICD-10-CM | POA: Diagnosis not present

## 2020-10-14 DIAGNOSIS — F322 Major depressive disorder, single episode, severe without psychotic features: Secondary | ICD-10-CM | POA: Diagnosis not present

## 2020-10-14 DIAGNOSIS — E7800 Pure hypercholesterolemia, unspecified: Secondary | ICD-10-CM | POA: Diagnosis not present

## 2020-10-14 DIAGNOSIS — N183 Chronic kidney disease, stage 3 unspecified: Secondary | ICD-10-CM | POA: Diagnosis not present

## 2020-11-04 ENCOUNTER — Observation Stay (HOSPITAL_BASED_OUTPATIENT_CLINIC_OR_DEPARTMENT_OTHER): Payer: Medicare Other

## 2020-11-04 ENCOUNTER — Emergency Department (HOSPITAL_COMMUNITY): Payer: Medicare Other

## 2020-11-04 ENCOUNTER — Other Ambulatory Visit: Payer: Self-pay

## 2020-11-04 ENCOUNTER — Encounter (HOSPITAL_COMMUNITY): Payer: Self-pay | Admitting: Radiology

## 2020-11-04 ENCOUNTER — Observation Stay (HOSPITAL_COMMUNITY)
Admission: EM | Admit: 2020-11-04 | Discharge: 2020-11-05 | Disposition: A | Discharge status: Home / Self Care | Payer: Medicare Other | Attending: Internal Medicine | Admitting: Internal Medicine

## 2020-11-04 DIAGNOSIS — N183 Chronic kidney disease, stage 3 unspecified: Secondary | ICD-10-CM | POA: Insufficient documentation

## 2020-11-04 DIAGNOSIS — R52 Pain, unspecified: Secondary | ICD-10-CM | POA: Diagnosis not present

## 2020-11-04 DIAGNOSIS — Z20822 Contact with and (suspected) exposure to covid-19: Secondary | ICD-10-CM | POA: Insufficient documentation

## 2020-11-04 DIAGNOSIS — R4781 Slurred speech: Secondary | ICD-10-CM

## 2020-11-04 DIAGNOSIS — G459 Transient cerebral ischemic attack, unspecified: Secondary | ICD-10-CM

## 2020-11-04 DIAGNOSIS — R471 Dysarthria and anarthria: Secondary | ICD-10-CM | POA: Diagnosis not present

## 2020-11-04 DIAGNOSIS — Z8547 Personal history of malignant neoplasm of testis: Secondary | ICD-10-CM | POA: Diagnosis not present

## 2020-11-04 DIAGNOSIS — I10 Essential (primary) hypertension: Secondary | ICD-10-CM | POA: Diagnosis present

## 2020-11-04 DIAGNOSIS — R2990 Unspecified symptoms and signs involving the nervous system: Secondary | ICD-10-CM | POA: Diagnosis present

## 2020-11-04 DIAGNOSIS — I951 Orthostatic hypotension: Secondary | ICD-10-CM | POA: Diagnosis not present

## 2020-11-04 DIAGNOSIS — Z950 Presence of cardiac pacemaker: Secondary | ICD-10-CM | POA: Diagnosis not present

## 2020-11-04 DIAGNOSIS — I6389 Other cerebral infarction: Secondary | ICD-10-CM

## 2020-11-04 DIAGNOSIS — I5033 Acute on chronic diastolic (congestive) heart failure: Secondary | ICD-10-CM | POA: Diagnosis not present

## 2020-11-04 DIAGNOSIS — Z79899 Other long term (current) drug therapy: Secondary | ICD-10-CM | POA: Insufficient documentation

## 2020-11-04 DIAGNOSIS — R55 Syncope and collapse: Secondary | ICD-10-CM

## 2020-11-04 DIAGNOSIS — I6523 Occlusion and stenosis of bilateral carotid arteries: Secondary | ICD-10-CM | POA: Diagnosis not present

## 2020-11-04 DIAGNOSIS — I421 Obstructive hypertrophic cardiomyopathy: Secondary | ICD-10-CM | POA: Diagnosis present

## 2020-11-04 DIAGNOSIS — R42 Dizziness and giddiness: Secondary | ICD-10-CM

## 2020-11-04 DIAGNOSIS — R4589 Other symptoms and signs involving emotional state: Secondary | ICD-10-CM

## 2020-11-04 DIAGNOSIS — Z7982 Long term (current) use of aspirin: Secondary | ICD-10-CM | POA: Diagnosis not present

## 2020-11-04 DIAGNOSIS — I130 Hypertensive heart and chronic kidney disease with heart failure and stage 1 through stage 4 chronic kidney disease, or unspecified chronic kidney disease: Secondary | ICD-10-CM | POA: Diagnosis not present

## 2020-11-04 DIAGNOSIS — W19XXXA Unspecified fall, initial encounter: Secondary | ICD-10-CM | POA: Diagnosis not present

## 2020-11-04 DIAGNOSIS — R1111 Vomiting without nausea: Secondary | ICD-10-CM | POA: Diagnosis not present

## 2020-11-04 DIAGNOSIS — F319 Bipolar disorder, unspecified: Secondary | ICD-10-CM | POA: Diagnosis present

## 2020-11-04 DIAGNOSIS — E7800 Pure hypercholesterolemia, unspecified: Secondary | ICD-10-CM | POA: Diagnosis present

## 2020-11-04 LAB — DIFFERENTIAL
Abs Immature Granulocytes: 0.01 10*3/uL (ref 0.00–0.07)
Basophils Absolute: 0 10*3/uL (ref 0.0–0.1)
Basophils Relative: 1 %
Eosinophils Absolute: 0.2 10*3/uL (ref 0.0–0.5)
Eosinophils Relative: 4 %
Immature Granulocytes: 0 %
Lymphocytes Relative: 32 %
Lymphs Abs: 1.4 10*3/uL (ref 0.7–4.0)
Monocytes Absolute: 0.6 10*3/uL (ref 0.1–1.0)
Monocytes Relative: 13 %
Neutro Abs: 2.2 10*3/uL (ref 1.7–7.7)
Neutrophils Relative %: 50 %

## 2020-11-04 LAB — COMPREHENSIVE METABOLIC PANEL
ALT: 20 U/L (ref 0–44)
AST: 21 U/L (ref 15–41)
Albumin: 4 g/dL (ref 3.5–5.0)
Alkaline Phosphatase: 55 U/L (ref 38–126)
Anion gap: 10 (ref 5–15)
BUN: 19 mg/dL (ref 8–23)
CO2: 24 mmol/L (ref 22–32)
Calcium: 8.8 mg/dL — ABNORMAL LOW (ref 8.9–10.3)
Chloride: 98 mmol/L (ref 98–111)
Creatinine, Ser: 1.54 mg/dL — ABNORMAL HIGH (ref 0.61–1.24)
GFR, Estimated: 47 mL/min — ABNORMAL LOW (ref >60)
Glucose, Bld: 148 mg/dL — ABNORMAL HIGH (ref 70–99)
Potassium: 3.7 mmol/L (ref 3.5–5.1)
Sodium: 132 mmol/L — ABNORMAL LOW (ref 135–145)
Total Bilirubin: 0.2 mg/dL — ABNORMAL LOW (ref 0.3–1.2)
Total Protein: 6.1 g/dL — ABNORMAL LOW (ref 6.5–8.1)

## 2020-11-04 LAB — ECHOCARDIOGRAM COMPLETE
Area-P 1/2: 1.71 cm2
Height: 75 in
S' Lateral: 3.2 cm
Weight: 3360 oz

## 2020-11-04 LAB — I-STAT CHEM 8, ED
BUN: 22 mg/dL (ref 8–23)
Calcium, Ion: 1.09 mmol/L — ABNORMAL LOW (ref 1.15–1.40)
Chloride: 96 mmol/L — ABNORMAL LOW (ref 98–111)
Creatinine, Ser: 1.5 mg/dL — ABNORMAL HIGH (ref 0.61–1.24)
Glucose, Bld: 141 mg/dL — ABNORMAL HIGH (ref 70–99)
HCT: 40 % (ref 39.0–52.0)
Hemoglobin: 13.6 g/dL (ref 13.0–17.0)
Potassium: 3.8 mmol/L (ref 3.5–5.1)
Sodium: 133 mmol/L — ABNORMAL LOW (ref 135–145)
TCO2: 24 mmol/L (ref 22–32)

## 2020-11-04 LAB — PROTIME-INR
INR: 1.1 (ref 0.8–1.2)
Prothrombin Time: 13.7 seconds (ref 11.4–15.2)

## 2020-11-04 LAB — CBC
HCT: 40.7 % (ref 39.0–52.0)
Hemoglobin: 13.7 g/dL (ref 13.0–17.0)
MCH: 29.8 pg (ref 26.0–34.0)
MCHC: 33.7 g/dL (ref 30.0–36.0)
MCV: 88.5 fL (ref 80.0–100.0)
Platelets: 219 10*3/uL (ref 150–400)
RBC: 4.6 MIL/uL (ref 4.22–5.81)
RDW: 12.6 % (ref 11.5–15.5)
WBC: 4.4 10*3/uL (ref 4.0–10.5)
nRBC: 0 % (ref 0.0–0.2)

## 2020-11-04 LAB — SARS CORONAVIRUS 2 (TAT 6-24 HRS): SARS Coronavirus 2: NEGATIVE

## 2020-11-04 LAB — APTT: aPTT: 27 seconds (ref 24–36)

## 2020-11-04 LAB — CBG MONITORING, ED: Glucose-Capillary: 133 mg/dL — ABNORMAL HIGH (ref 70–99)

## 2020-11-04 MED ORDER — ACETAMINOPHEN 325 MG PO TABS: 650.0000 mg | ORAL_TABLET | ORAL | Status: DC | PRN

## 2020-11-04 MED ORDER — QUETIAPINE FUMARATE 200 MG PO TABS
200.0000 mg | ORAL_TABLET | Freq: Every day | ORAL | Status: DC
  Administered 2020-11-04: 23:00:00 200 mg via ORAL
  Filled 2020-11-04 (×3): qty 1

## 2020-11-04 MED ORDER — VENLAFAXINE HCL ER 150 MG PO CP24
150.0000 mg | ORAL_CAPSULE | Freq: Every day | ORAL | Status: DC
  Administered 2020-11-05: 09:00:00 150 mg via ORAL
  Filled 2020-11-04 (×2): qty 1

## 2020-11-04 MED ORDER — SENNOSIDES-DOCUSATE SODIUM 8.6-50 MG PO TABS: 1.0000 | ORAL_TABLET | Freq: Every evening | ORAL | Status: DC | PRN

## 2020-11-04 MED ORDER — SODIUM CHLORIDE 0.9% FLUSH
3.0000 mL | Freq: Once | INTRAVENOUS | Status: AC
  Administered 2020-11-04: 11:00:00 3 mL via INTRAVENOUS

## 2020-11-04 MED ORDER — ACETAMINOPHEN 160 MG/5ML PO SOLN: 650.0000 mg | ORAL | Status: DC | PRN

## 2020-11-04 MED ORDER — ATORVASTATIN CALCIUM 40 MG PO TABS
40.0000 mg | ORAL_TABLET | Freq: Every day | ORAL | Status: DC
  Administered 2020-11-04 – 2020-11-05 (×2): 40 mg via ORAL
  Filled 2020-11-04 (×2): qty 1

## 2020-11-04 MED ORDER — SODIUM CHLORIDE 0.9 % IV BOLUS
1000.0000 mL | Freq: Once | INTRAVENOUS | Status: AC
  Administered 2020-11-04: 13:00:00 1000 mL via INTRAVENOUS

## 2020-11-04 MED ORDER — ENOXAPARIN SODIUM 40 MG/0.4ML ~~LOC~~ SOLN
40.0000 mg | SUBCUTANEOUS | Status: DC
  Administered 2020-11-04 – 2020-11-05 (×2): 40 mg via SUBCUTANEOUS
  Filled 2020-11-04 (×2): qty 0.4

## 2020-11-04 MED ORDER — LAMOTRIGINE 100 MG PO TABS
200.0000 mg | ORAL_TABLET | Freq: Every day | ORAL | Status: DC
  Administered 2020-11-05: 09:00:00 200 mg via ORAL
  Filled 2020-11-04: qty 2

## 2020-11-04 MED ORDER — ASPIRIN EC 81 MG PO TBEC
81.0000 mg | DELAYED_RELEASE_TABLET | Freq: Every day | ORAL | Status: DC
  Administered 2020-11-04 – 2020-11-05 (×2): 81 mg via ORAL
  Filled 2020-11-04 (×2): qty 1

## 2020-11-04 MED ORDER — SODIUM CHLORIDE 0.9 % IV SOLN: INTRAVENOUS | Status: DC

## 2020-11-04 MED ORDER — IOHEXOL 350 MG/ML SOLN
50.0000 mL | Freq: Once | INTRAVENOUS | Status: AC | PRN
  Administered 2020-11-04: 50 mL via INTRAVENOUS

## 2020-11-04 MED ORDER — BUPROPION HCL ER (SR) 100 MG PO TB12
100.0000 mg | ORAL_TABLET | Freq: Two times a day (BID) | ORAL | Status: DC
  Administered 2020-11-04 – 2020-11-05 (×2): 100 mg via ORAL
  Filled 2020-11-04 (×3): qty 1

## 2020-11-04 MED ORDER — STROKE: EARLY STAGES OF RECOVERY BOOK
Freq: Once | Status: AC
  Filled 2020-11-04: qty 1

## 2020-11-04 MED ORDER — ACETAMINOPHEN 650 MG RE SUPP: 650.0000 mg | RECTAL | Status: DC | PRN

## 2020-11-04 NOTE — Code Documentation (Signed)
Stroke Response Nurse Documentation Code Documentation  Michael Barton is a 74 y.o. male arriving to [facility] ED via [facility] on 11/04/2020 with past medical hx of HOCM, Sleep apnea, Hyperlipidemia, Testicular cancer, and Depression. Code stroke was activated by EMS after they noted slurred speech and dizziness. Patient from work where he was LKW at 0900 while looking at his computer. Pt had a sudden onset of dizziness where he reported having trouble speaking. The patient started crying and reported everything getting worse. Staff called EMS.   Stroke team at the bedside on patient arrival. Labs drawn and patient cleared for CT by Dr. Criss Alvine. Patient to CT with team. Upon arrival to CT, slight left leg weakness noted and patient reported dizziness was resolving. By end of exam, NIHSS is 0. Walked patient to bed with no gait ataxia. Pt reported feeling a little unsteady and lightheaded. When asked about his dizziness, pt reported the "room was getting dark and I felt like I was going to pass out." NIHSS 0, see documentation for details and code stroke times.The following imaging was completed:  CT, CTA head and neck. Patient is not a candidate for tPA due to symptoms resolving. Patient continues to complain of slight dizziness with turning of the stretcher, but continues to resolve. Care/Plan: Recheck in 30 minutes. q2 mNIHSS/VS, TIA Alert. Bedside handoff with ED RN Irving Burton.    Lucila Maine  Stroke Response RN

## 2020-11-04 NOTE — ED Notes (Signed)
Dinner Tray Ordered @ 1714. 

## 2020-11-04 NOTE — ED Notes (Signed)
Cardiac ECHO being done at bedside  

## 2020-11-04 NOTE — Progress Notes (Signed)
Pt has MR Conditional pacer and leads. Per Consulting civil engineer, neither SWOT can monitor pt today. Exam TBD tomorrow morning. Secure chat sent to ordering physician.

## 2020-11-04 NOTE — Progress Notes (Signed)
  Echocardiogram 2D Echocardiogram has been performed.  Augustine Radar 11/04/2020, 4:04 PM

## 2020-11-04 NOTE — ED Provider Notes (Signed)
[place] EMERGENCY DEPARTMENT Provider Note   CSN: [id number] Arrival date & time: 11/04/20  1017  An emergency department physician performed an initial assessment on this suspected stroke patient at 1014.  History Chief Complaint  Patient presents with  . Code Stroke    Michael Barton is a 74 y.o. male.  Patient presents via EMS as a code stroke activation. Pt indicates around 9 am, sitting at desk on computer, had acute onset dizziness. Symptoms acute onset, mod-severe, constant, persistent, now resolved. Pt describes dizziness as being lightheaded or feeling faint. No room spinning. States at time, he began crying (states not sure why), and that he was having trouble speaking. +also felt nauseated. Denies any change in vision. No unilateral numbness or weakness or loss of normal functional ability.  Denies palpitations or sense of rapid or slow heart beat - has hx pacemaker, hx CHB. Denies headache. No chest pain. No sob or unusual doe. No abd pain. No leg pain or swelling. Denies fever or chills. Denies any recent change in meds. No recent stressors.   The history is provided by the patient and the EMS personnel.       Past Medical History:  Diagnosis Date  . Depression   . History of orchiectomy   . HOCM (hypertrophic obstructive cardiomyopathy) ([place]) 06/24/2016   cMRI 3/18: Severe asymm septal hypertrophy, EF 74, SAM, mod MR, turb flow sugg of significant LVOT gradient, picture c/w HOCM // Echo 6/17: Mild conc LVH, LVOT gradient 14, EF 65-70, mild MR // CP stress test 6/17: NL functional capacity compared to matched sedentary norms. Pt primarily limited by body habitus and deconditioning // Echo 9/12: EF 69.8, LVOT gradient 16   . HOCM (hypertrophic obstructive cardiomyopathy) ([place])   . Hyperlipidemia   . Sleep apnea   . Testicular cancer [facility])     Patient Active Problem List   Diagnosis Date Noted  . Pacemaker 04/08/2020  . Chronic kidney disease  (CKD), stage III (moderate) ([place]) 10/27/2017  . Chromophytosis 10/27/2017  . 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 Major depressive disorder, single episode, severe without psychotic features ([place]) 10/27/2017  . Syncope 07/29/2017  . Elevated troponin 07/29/2017  . Depression 07/29/2017  . Hypercholesterolemia 07/29/2017  . Syncope and collapse 07/29/2017  . Complete heart block ([place]) 07/25/2017  . Cardiac arrest ([place]) 07/25/2017  . HOCM (hypertrophic obstructive cardiomyopathy) ([place]) 07/25/2017  . Acute respiratory failure with hypoxia ([place])   . Cardiogenic shock ([place])   . Obstructive sleep apnea 07/19/2017  . History of testicular cancer 06/01/2017  . Hypertrophic cardiomyopathy ([place]) 06/01/2017  . HOCM (hypertrophic obstructive cardiomyopathy) ([place]) 06/24/2016  . Breath shortness 02/23/2016  . Below normal amount of sodium in the blood 01/29/2016  . Cardiac murmur 01/29/2016  . Bipolar affective disorder ([place]) 11/16/2011    Past Surgical History:  Procedure Laterality Date  . ORCHIECTOMY    . PACEMAKER IMPLANT N/A 07/26/2017   Procedure: Pacemaker Implant;  Surgeon: Evans Lance, MD;  Location: [place] [facility];  Service: Cardiovascular;  Laterality: N/A;  . RIGHT/LEFT HEART CATH AND CORONARY ANGIOGRAPHY N/A 04/10/2017   Procedure: Right/Left Heart Cath and Coronary Angiography;  Surgeon: Nelva Bush, MD;  Location: [place] [facility];  Service: Cardiovascular;  Laterality: N/A;  . TEMPORARY PACEMAKER N/A 07/25/2017   Procedure: Temporary Pacemaker;  Surgeon: Sherren Mocha, MD;  Location: [place] [facility];  Service: Cardiovascular;  Laterality: N/A;       Family History  Problem Relation  Age of Onset  . Thyroid disease Mother   . Depression Mother   . Cancer Father     Social History   Tobacco Use  . Smoking status: Never Smoker  . Smokeless tobacco: Never Used  Vaping Use  . Vaping Use: Never used  Substance Use Topics  . Alcohol use: No  . Drug use: No     Home Medications Prior to Admission medications   Medication Sig Start Date End Date Taking? Authorizing Provider  aspirin EC 81 MG tablet Take 1 tablet (81 mg total) by mouth daily. 04/06/17   Nahser, Wonda Cheng, MD  buPROPion (WELLBUTRIN SR) 100 MG 12 hr tablet Take 100 mg by mouth 2 (two) times daily.    [provider]  ibuprofen (ADVIL,MOTRIN) 200 MG tablet Take 200 mg by mouth 2 (two) times a day.     [provider]  lamoTRIgine (LAMICTAL) 200 MG tablet Take 200 mg by mouth daily.    [provider]  metoprolol succinate (TOPROL-XL) 100 MG 24 hr tablet Take 1 tablet (100 mg total) by mouth daily. Take with or immediately following a meal. 04/24/20   Nahser, Wonda Cheng, MD  pravastatin (PRAVACHOL) 40 MG tablet Take 40 mg by mouth daily.    [provider]  QUEtiapine (SEROQUEL) 200 MG tablet Take 200 mg by mouth at bedtime.    [provider]  Testosterone 1.62 % GEL Apply 40.5 mg topically in the morning. 09/17/20   [provider]  venlafaxine XR (EFFEXOR-XR) 150 MG 24 hr capsule Take 150 mg by mouth daily with breakfast.    [provider]    Allergies    Ambien [zolpidem tartrate] and Prozac [fluoxetine]  Review of Systems   Review of Systems  Constitutional: Negative for fever.  HENT: Negative for sore throat.   Eyes: Negative for visual disturbance.  Respiratory: Negative for shortness of breath.   Cardiovascular: Negative for chest pain.  Gastrointestinal: Positive for nausea. Negative for abdominal pain.  Genitourinary: Negative for flank pain.  Musculoskeletal: Negative for back pain and neck pain.  Skin: Negative for rash.  Neurological: Positive for dizziness and light-headedness. Negative for weakness, numbness and headaches.  Hematological: Does not bruise/bleed easily.  Psychiatric/Behavioral: Negative for confusion.    Physical Exam Updated Vital Signs BP 121/75   Pulse 61   Temp 97.7 F (36.5 C)  (Oral)   Resp (!) 9 Comment: Count is accurate.  Ht 1.905 m (6\' 3" )   Wt 95.3 kg   SpO2 94%   BMI 26.25 kg/m   Physical Exam  ED Results / Procedures / Treatments   Labs (all labs ordered are listed, but only abnormal results are displayed) Results for orders placed or performed during the hospital encounter of 11/04/20  Protime-INR  Result Value Ref Range   Prothrombin Time 13.7 11.4 - 15.2 seconds   INR 1.1 0.8 - 1.2  APTT  Result Value Ref Range   aPTT 27 24 - 36 seconds  CBC  Result Value Ref Range   WBC 4.4 4.0 - 10.5 K/uL   RBC 4.60 4.22 - 5.81 MIL/uL   Hemoglobin 13.7 13.0 - 17.0 g/dL   HCT 40.7 39.0 - 52.0 %   MCV 88.5 80.0 - 100.0 fL   MCH 29.8 26.0 - 34.0 pg   MCHC 33.7 30.0 - 36.0 g/dL   RDW 12.6 11.5 - 15.5 %   Platelets 219 150 - 400 K/uL   nRBC 0.0 0.0 -  0.2 %  Differential  Result Value Ref Range   Neutrophils Relative % 50 %   Neutro Abs 2.2 1.7 - 7.7 K/uL   Lymphocytes Relative 32 %   Lymphs Abs 1.4 0.7 - 4.0 K/uL   Monocytes Relative 13 %   Monocytes Absolute 0.6 0.1 - 1.0 K/uL   Eosinophils Relative 4 %   Eosinophils Absolute 0.2 0.0 - 0.5 K/uL   Basophils Relative 1 %   Basophils Absolute 0.0 0.0 - 0.1 K/uL   Immature Granulocytes 0 %   Abs Immature Granulocytes 0.01 0.00 - 0.07 K/uL  Comprehensive metabolic panel  Result Value Ref Range   Sodium 132 (L) 135 - 145 mmol/L   Potassium 3.7 3.5 - 5.1 mmol/L   Chloride 98 98 - 111 mmol/L   CO2 24 22 - 32 mmol/L   Glucose, Bld 148 (H) 70 - 99 mg/dL   BUN 19 8 - 23 mg/dL   Creatinine, Ser 6.29 (H) 0.61 - 1.24 mg/dL   Calcium 8.8 (L) 8.9 - 10.3 mg/dL   Total Protein 6.1 (L) 6.5 - 8.1 g/dL   Albumin 4.0 3.5 - 5.0 g/dL   AST 21 15 - 41 U/L   ALT 20 0 - 44 U/L   Alkaline Phosphatase 55 38 - 126 U/L   Total Bilirubin 0.2 (L) 0.3 - 1.2 mg/dL   GFR, Estimated 47 (L) >60 mL/min   Anion gap 10 5 - 15  I-stat chem 8, ED  Result Value Ref Range   Sodium 133 (L) 135 - 145 mmol/L   Potassium 3.8 3.5  - 5.1 mmol/L   Chloride 96 (L) 98 - 111 mmol/L   BUN 22 8 - 23 mg/dL   Creatinine, Ser 5.28 (H) 0.61 - 1.24 mg/dL   Glucose, Bld 413 (H) 70 - 99 mg/dL   Calcium, Ion 2.44 (L) 1.15 - 1.40 mmol/L   TCO2 24 22 - 32 mmol/L   Hemoglobin 13.6 13.0 - 17.0 g/dL   HCT 01.0 27.2 - 53.6 %  CBG monitoring, ED  Result Value Ref Range   Glucose-Capillary 133 (H) 70 - 99 mg/dL   CT Code Stroke CTA Head W/WO contrast  Result Date: 11/04/2020 CLINICAL DATA:  Acute stroke presentation. Dizziness and slurred speech. EXAM: CT ANGIOGRAPHY HEAD AND NECK TECHNIQUE: Multidetector CT imaging of the head and neck was performed using the standard protocol during bolus administration of intravenous contrast. Multiplanar CT image reconstructions and MIPs were obtained to evaluate the vascular anatomy. Carotid stenosis measurements (when applicable) are obtained utilizing NASCET criteria, using the distal internal carotid diameter as the denominator. CONTRAST:  79mL OMNIPAQUE IOHEXOL 350 MG/ML SOLN COMPARISON:  Head CT earlier same day FINDINGS: CTA NECK FINDINGS Aortic arch: Mild aortic atherosclerosis. Branching pattern is normal without origin stenosis. Right carotid system: Common carotid artery widely patent to the bifurcation. Mild calcified plaque at the carotid bifurcation and ICA bulb but no stenosis. Cervical ICA normal beyond that. Left carotid system: Common carotid artery widely patent to the bifurcation. Soft and calcified plaque at the carotid bifurcation and ICA bulb. No stenosis. Cervical ICA normal beyond that. Vertebral arteries: Both vertebral artery origins are widely patent. Both vertebral arteries appear normal through the cervical region to the foramen magnum. Skeleton: Ordinary cervical spondylosis. Other neck: No worrisome mass or lymphadenopathy. Lipoma of the posterior midline neck. Upper chest: Normal Review of the MIP images confirms the above findings CTA HEAD FINDINGS Anterior circulation: Both  internal carotid arteries are patent  through the skull base and siphon regions. No siphon stenosis. The anterior and middle cerebral vessels are patent without large or medium vessel occlusion or stenosis. No aneurysm or vascular malformation. Posterior circulation: Both vertebral arteries widely patent the basilar. No basilar stenosis. Posterior circulation branch vessels are patent. Venous sinuses: Patent and normal. Anatomic variants: None significant. Review of the MIP images confirms the above findings IMPRESSION: 1. No large or medium vessel occlusion. 2. Mild atherosclerotic disease at both carotid bifurcations but without stenosis. 3. These results were communicated to Dr. Cheral Marker at 10:46 amon 12/29/2021by text page via the [facility] messaging system. Aortic Atherosclerosis (ICD10-I70.0). Electronically Signed   By: Nelson Chimes M.D.   On: 11/04/2020 10:46   CT Code Stroke CTA Neck W/WO contrast  Result Date: 11/04/2020 CLINICAL DATA:  Acute stroke presentation. Dizziness and slurred speech. EXAM: CT ANGIOGRAPHY HEAD AND NECK TECHNIQUE: Multidetector CT imaging of the head and neck was performed using the standard protocol during bolus administration of intravenous contrast. Multiplanar CT image reconstructions and MIPs were obtained to evaluate the vascular anatomy. Carotid stenosis measurements (when applicable) are obtained utilizing NASCET criteria, using the distal internal carotid diameter as the denominator. CONTRAST:  61mL OMNIPAQUE IOHEXOL 350 MG/ML SOLN COMPARISON:  Head CT earlier same day FINDINGS: CTA NECK FINDINGS Aortic arch: Mild aortic atherosclerosis. Branching pattern is normal without origin stenosis. Right carotid system: Common carotid artery widely patent to the bifurcation. Mild calcified plaque at the carotid bifurcation and ICA bulb but no stenosis. Cervical ICA normal beyond that. Left carotid system: Common carotid artery widely patent to the bifurcation. Soft and calcified  plaque at the carotid bifurcation and ICA bulb. No stenosis. Cervical ICA normal beyond that. Vertebral arteries: Both vertebral artery origins are widely patent. Both vertebral arteries appear normal through the cervical region to the foramen magnum. Skeleton: Ordinary cervical spondylosis. Other neck: No worrisome mass or lymphadenopathy. Lipoma of the posterior midline neck. Upper chest: Normal Review of the MIP images confirms the above findings CTA HEAD FINDINGS Anterior circulation: Both internal carotid arteries are patent through the skull base and siphon regions. No siphon stenosis. The anterior and middle cerebral vessels are patent without large or medium vessel occlusion or stenosis. No aneurysm or vascular malformation. Posterior circulation: Both vertebral arteries widely patent the basilar. No basilar stenosis. Posterior circulation branch vessels are patent. Venous sinuses: Patent and normal. Anatomic variants: None significant. Review of the MIP images confirms the above findings IMPRESSION: 1. No large or medium vessel occlusion. 2. Mild atherosclerotic disease at both carotid bifurcations but without stenosis. 3. These results were communicated to Dr. Cheral Marker at 10:46 amon 12/29/2021by text page via the [facility] messaging system. Aortic Atherosclerosis (ICD10-I70.0). Electronically Signed   By: Nelson Chimes M.D.   On: 11/04/2020 10:46   CT HEAD CODE STROKE WO CONTRAST  Result Date: 11/04/2020 CLINICAL DATA:  Code stroke.  Slurred speech.  Dizziness. EXAM: CT HEAD WITHOUT CONTRAST TECHNIQUE: Contiguous axial images were obtained from the base of the skull through the vertex without intravenous contrast. COMPARISON:  07/29/2017 FINDINGS: Brain: Age related volume loss. Evidence of acute infarction, mass lesion, hemorrhage, hydrocephalus or extra-axial collection. Vascular: There is atherosclerotic calcification of the major vessels at the base of the brain. Skull: Negative Sinuses/Orbits:  Clear/normal Other: None ASPECTS ([place] Stroke Program Early CT Score) - Ganglionic level infarction (caudate, lentiform nuclei, internal capsule, insula, M1-M3 cortex): 7 - Supraganglionic infarction (M4-M6 cortex): 3 Total score (0-10 with 10 being normal): 10 IMPRESSION:  1. No acute finding by CT. Age related volume loss. 2. ASPECTS is 10. 3. These results were communicated to Dr. Otelia LimesLindzen at 10:26 amon 12/29/2021by text page via the [facility] messaging system. Electronically Signed   By: Paulina FusiMark  Shogry M.D.   On: 11/04/2020 10:27    EKG EKG Interpretation  Date/Time:  Wednesday November 04 2020 10:45:38 EST Ventricular Rate:  63 PR Interval:    QRS Duration: 163 QT Interval:  452 QTC Calculation: 463 R Axis:   43 Text Interpretation: Sinus rhythm Right bundle branch block Non-specific ST-t changes No significant change since last tracing Confirmed by Cathren LaineSteinl, Sharilynn Cassity ([id number]) on 11/04/2020 10:55:48 AM   Radiology CT Code Stroke CTA Head W/WO contrast  Result Date: 11/04/2020 CLINICAL DATA:  Acute stroke presentation. Dizziness and slurred speech. EXAM: CT ANGIOGRAPHY HEAD AND NECK TECHNIQUE: Multidetector CT imaging of the head and neck was performed using the standard protocol during bolus administration of intravenous contrast. Multiplanar CT image reconstructions and MIPs were obtained to evaluate the vascular anatomy. Carotid stenosis measurements (when applicable) are obtained utilizing NASCET criteria, using the distal internal carotid diameter as the denominator. CONTRAST:  50mL OMNIPAQUE IOHEXOL 350 MG/ML SOLN COMPARISON:  Head CT earlier same day FINDINGS: CTA NECK FINDINGS Aortic arch: Mild aortic atherosclerosis. Branching pattern is normal without origin stenosis. Right carotid system: Common carotid artery widely patent to the bifurcation. Mild calcified plaque at the carotid bifurcation and ICA bulb but no stenosis. Cervical ICA normal beyond that. Left carotid system: Common carotid  artery widely patent to the bifurcation. Soft and calcified plaque at the carotid bifurcation and ICA bulb. No stenosis. Cervical ICA normal beyond that. Vertebral arteries: Both vertebral artery origins are widely patent. Both vertebral arteries appear normal through the cervical region to the foramen magnum. Skeleton: Ordinary cervical spondylosis. Other neck: No worrisome mass or lymphadenopathy. Lipoma of the posterior midline neck. Upper chest: Normal Review of the MIP images confirms the above findings CTA HEAD FINDINGS Anterior circulation: Both internal carotid arteries are patent through the skull base and siphon regions. No siphon stenosis. The anterior and middle cerebral vessels are patent without large or medium vessel occlusion or stenosis. No aneurysm or vascular malformation. Posterior circulation: Both vertebral arteries widely patent the basilar. No basilar stenosis. Posterior circulation branch vessels are patent. Venous sinuses: Patent and normal. Anatomic variants: None significant. Review of the MIP images confirms the above findings IMPRESSION: 1. No large or medium vessel occlusion. 2. Mild atherosclerotic disease at both carotid bifurcations but without stenosis. 3. These results were communicated to Dr. Otelia LimesLindzen at 10:46 amon 12/29/2021by text page via the [facility]. Aortic Atherosclerosis (ICD10-I70.0). Electronically Signed   By: Paulina FusiMark  Shogry M.D.   On: 11/04/2020 10:46   CT Code Stroke CTA Neck W/WO contrast  Result Date: 11/04/2020 CLINICAL DATA:  Acute stroke presentation. Dizziness and slurred speech. EXAM: CT ANGIOGRAPHY HEAD AND NECK TECHNIQUE: Multidetector CT imaging of the head and neck was performed using the standard protocol during bolus administration of intravenous contrast. Multiplanar CT image reconstructions and MIPs were obtained to evaluate the vascular anatomy. Carotid stenosis measurements (when applicable) are obtained utilizing NASCET criteria,  using the distal internal carotid diameter as the denominator. CONTRAST:  50mL OMNIPAQUE IOHEXOL 350 MG/ML SOLN COMPARISON:  Head CT earlier same day FINDINGS: CTA NECK FINDINGS Aortic arch: Mild aortic atherosclerosis. Branching pattern is normal without origin stenosis. Right carotid system: Common carotid artery widely patent to the bifurcation. Mild calcified plaque at the carotid  bifurcation and ICA bulb but no stenosis. Cervical ICA normal beyond that. Left carotid system: Common carotid artery widely patent to the bifurcation. Soft and calcified plaque at the carotid bifurcation and ICA bulb. No stenosis. Cervical ICA normal beyond that. Vertebral arteries: Both vertebral artery origins are widely patent. Both vertebral arteries appear normal through the cervical region to the foramen magnum. Skeleton: Ordinary cervical spondylosis. Other neck: No worrisome mass or lymphadenopathy. Lipoma of the posterior midline neck. Upper chest: Normal Review of the MIP images confirms the above findings CTA HEAD FINDINGS Anterior circulation: Both internal carotid arteries are patent through the skull base and siphon regions. No siphon stenosis. The anterior and middle cerebral vessels are patent without large or medium vessel occlusion or stenosis. No aneurysm or vascular malformation. Posterior circulation: Both vertebral arteries widely patent the basilar. No basilar stenosis. Posterior circulation branch vessels are patent. Venous sinuses: Patent and normal. Anatomic variants: None significant. Review of the MIP images confirms the above findings IMPRESSION: 1. No large or medium vessel occlusion. 2. Mild atherosclerotic disease at both carotid bifurcations but without stenosis. 3. These results were communicated to Dr. Cheral Marker at 10:46 amon 12/29/2021by text page via the [facility] messaging system. Aortic Atherosclerosis (ICD10-I70.0). Electronically Signed   By: Nelson Chimes M.D.   On: 11/04/2020 10:46   CT HEAD CODE  STROKE WO CONTRAST  Result Date: 11/04/2020 CLINICAL DATA:  Code stroke.  Slurred speech.  Dizziness. EXAM: CT HEAD WITHOUT CONTRAST TECHNIQUE: Contiguous axial images were obtained from the base of the skull through the vertex without intravenous contrast. COMPARISON:  07/29/2017 FINDINGS: Brain: Age related volume loss. Evidence of acute infarction, mass lesion, hemorrhage, hydrocephalus or extra-axial collection. Vascular: There is atherosclerotic calcification of the major vessels at the base of the brain. Skull: Negative Sinuses/Orbits: Clear/normal Other: None ASPECTS ([employer] Stroke Program Early CT Score) - Ganglionic level infarction (caudate, lentiform nuclei, internal capsule, insula, M1-M3 cortex): 7 - Supraganglionic infarction (M4-M6 cortex): 3 Total score (0-10 with 10 being normal): 10 IMPRESSION: 1. No acute finding by CT. Age related volume loss. 2. ASPECTS is 10. 3. These results were communicated to Dr. Cheral Marker at [place] 12/29/2021by text page via the [facility] messaging system. Electronically Signed   By: Nelson Chimes M.D.   On: 11/04/2020 10:27    Procedures Procedures (including critical care time)  Medications Ordered in ED Medications  sodium chloride flush (NS) 0.9 % injection 3 mL (3 mLs Intravenous Given 11/04/20 1056)  iohexol (OMNIPAQUE) 350 MG/ML injection 50 mL (50 mLs Intravenous Contrast Given 11/04/20 1038)    ED Course  I have reviewed the triage vital signs and the nursing notes.  Pertinent labs & imaging results that were available during my care of the patient were reviewed by me and considered in my medical decision making (see chart for details).    MDM Rules/Calculators/A&P                          Iv ns. Continuous pulse ox and cardiac monitoring - sinus rhythm. Stat labs and imaging.   Reviewed nursing notes and prior charts for additional history.   Neurology consulted pta, and at bedside.   Labs reviewed/interpreted by me - chem normal  except na slightly low. hgb is normal.   CT reviewed/interpreted by me - no hem.   RN will interrogate pts medtronic device.  Rep called back -  Indicates device appears to be working normally, no dysrhythmia  or significant bradycardic episode noted.   Neurology has recommended admission for additional workup.  Medicine/hospitalists consulted for admission.     Final Clinical Impression(s) / ED Diagnoses Final diagnoses:  None    Rx / DC Orders ED Discharge Orders    None       Lajean Saver, MD 11/04/20 1203

## 2020-11-04 NOTE — H&P (Signed)
History and Physical    Michael Barton [id number] DOB: 09-22-1946 DOA: 11/04/2020  PCP: Gaynelle Arabian, MD Consultants:  Charlsie Quest - psychiatry; Taylor/Nahser - cardiology; Louis Meckel - urology Patient coming from:  Home - lives with friend; NOK: [place], [place], [phone]   Chief Complaint: Code stroke  HPI: Michael Barton is a 74 y.o. male with medical history significant of testicular cancer; OSA; HLD; HOCM; pacemaker placement; and bipolar depression presenting with code stroke.  He became dizzy for a period of time and hoped it would get better but it worsened.  It started about 930AM.  No N/W/T.  No dysphagia.  +dysarthria, hard to make full words and sentences - different sounds than the words he was trying to pronounce.  He also had a lot of crying and that is very unusual for him.   ED Course:  Code stroke - dizziness, word finding difficulty, became very emotional and labile.   Neurology has seen, CT negative, recommends completion of stroke evaluation. Pacer interrogated and ok.  Review of Systems: As per HPI; otherwise review of systems reviewed and negative.   Ambulatory Status:  Ambulates without assistance  COVID Vaccine Status:   Complete plus booster  Past Medical History:  Diagnosis Date  . Depression   . HOCM (hypertrophic obstructive cardiomyopathy) ([place]) 06/24/2016   cMRI 3/18: Severe asymm septal hypertrophy, EF 74, SAM, mod MR, turb flow sugg of significant LVOT gradient, picture c/w HOCM // Echo 6/17: Mild conc LVH, LVOT gradient 14, EF 65-70, mild MR // CP stress test 6/17: NL functional capacity compared to matched sedentary norms. Pt primarily limited by body habitus and deconditioning // Echo 9/12: EF 69.8, LVOT gradient 16   . Hyperlipidemia   . Sleep apnea   . Testicular cancer ([place]) 1990   h/o orchiectomy    Past Surgical History:  Procedure Laterality Date  . ORCHIECTOMY    . PACEMAKER IMPLANT N/A 07/26/2017   Procedure:  Pacemaker Implant;  Surgeon: Evans Lance, MD;  Location: [place] [facility];  Service: Cardiovascular;  Laterality: N/A;  . RIGHT/LEFT HEART CATH AND CORONARY ANGIOGRAPHY N/A 04/10/2017   Procedure: Right/Left Heart Cath and Coronary Angiography;  Surgeon: Nelva Bush, MD;  Location: [place] [facility];  Service: Cardiovascular;  Laterality: N/A;  . TEMPORARY PACEMAKER N/A 07/25/2017   Procedure: Temporary Pacemaker;  Surgeon: Sherren Mocha, MD;  Location: [place] [facility];  Service: Cardiovascular;  Laterality: N/A;    Social History   Socioeconomic History  . Marital status: Single    Spouse name: Not on file  . Number of children: Not on file  . Years of education: Not on file  . Highest education level: Not on file  Occupational History  . Occupation: Lobbyist  Tobacco Use  . Smoking status: Never Smoker  . Smokeless tobacco: Never Used  Vaping Use  . Vaping Use: Never used  Substance and Sexual Activity  . Alcohol use: Yes    Comment: minimal, maybe 1 a week  . Drug use: No  . Sexual activity: Not on file  Other Topics Concern  . Not on file  Social History Narrative   ** Merged History Encounter **       Social Determinants of Health   Financial Resource Strain: Not on file  Food Insecurity: Not on file  Transportation Needs: Not on file  Physical Activity: Not on file  Stress: Not on file  Social Connections: Not on file  Intimate Partner Violence:  Not on file    Allergies  Allergen Reactions  . Ambien [Zolpidem Tartrate] Other (See Comments)    GERD  . Prozac [Fluoxetine] Other (See Comments)    "makes me feel weird"    Family History  Problem Relation Age of Onset  . Thyroid disease Mother   . Depression Mother   . Cancer Father   . Stroke Neg Hx     Prior to Admission medications   Medication Sig Start Date End Date Taking? Authorizing Provider  aspirin EC 81 MG tablet Take 1 tablet (81 mg total) by mouth daily. 04/06/17    Nahser, Wonda Cheng, MD  buPROPion (WELLBUTRIN SR) 100 MG 12 hr tablet Take 100 mg by mouth 2 (two) times daily.    [provider]  ibuprofen (ADVIL,MOTRIN) 200 MG tablet Take 200 mg by mouth 2 (two) times a day.     [provider]  lamoTRIgine (LAMICTAL) 200 MG tablet Take 200 mg by mouth daily.    [provider]  metoprolol succinate (TOPROL-XL) 100 MG 24 hr tablet Take 1 tablet (100 mg total) by mouth daily. Take with or immediately following a meal. 04/24/20   Nahser, Wonda Cheng, MD  pravastatin (PRAVACHOL) 40 MG tablet Take 40 mg by mouth daily.    [provider]  QUEtiapine (SEROQUEL) 200 MG tablet Take 200 mg by mouth at bedtime.    [provider]  Testosterone 1.62 % GEL Apply 40.5 mg topically in the morning. 09/17/20   [provider]  venlafaxine XR (EFFEXOR-XR) 150 MG 24 hr capsule Take 150 mg by mouth daily with breakfast.    [provider]    Physical Exam: Vitals:   11/04/20 1615 11/04/20 1700 11/04/20 1715 11/04/20 1800  BP: 138/83 135/89 134/77 (!) 145/75  Pulse: 60 71 66 62  Resp: 17 18 16 15   Temp:      TempSrc:      SpO2: 98% 96% 100% 99%  Weight:      Height:         . General:  Appears calm and comfortable and is in NAD . Eyes:  PERRL, EOMI, normal lids, iris . ENT:  grossly normal hearing, lips & tongue, mmm . Neck:  no LAD, masses or thyromegaly; no carotid bruits . Cardiovascular:  RRR, no m/r/g. No LE edema.  Marland Kitchen Respiratory:   CTA bilaterally with no wheezes/rales/rhonchi.  Normal respiratory effort. . Abdomen:  soft, NT, ND, NABS . Skin:  no rash or induration seen on limited exam . Musculoskeletal:  grossly normal tone BUE/BLE, good ROM, no bony abnormality . Psychiatric:  grossly normal mood and affect, speech fluent and appropriate with minimal word-finding difficulties, AOx3; no longer emotionally labile . Neurologic:  CN 2-12 grossly intact, moves all extremities in coordinated fashion,  sensation intact    Radiological Exams on Admission: Independently reviewed - see discussion in A/P where applicable  CT Code Stroke CTA Head W/WO contrast  Result Date: 11/04/2020 CLINICAL DATA:  Acute stroke presentation. Dizziness and slurred speech. EXAM: CT ANGIOGRAPHY HEAD AND NECK TECHNIQUE: Multidetector CT imaging of the head and neck was performed using the standard protocol during bolus administration of intravenous contrast. Multiplanar CT image reconstructions and MIPs were obtained to evaluate the vascular anatomy. Carotid stenosis measurements (when applicable) are obtained utilizing NASCET criteria, using the distal internal carotid diameter as the denominator. CONTRAST:  23mL OMNIPAQUE IOHEXOL 350 MG/ML SOLN COMPARISON:  Head CT earlier same day FINDINGS: CTA NECK FINDINGS  Aortic arch: Mild aortic atherosclerosis. Branching pattern is normal without origin stenosis. Right carotid system: Common carotid artery widely patent to the bifurcation. Mild calcified plaque at the carotid bifurcation and ICA bulb but no stenosis. Cervical ICA normal beyond that. Left carotid system: Common carotid artery widely patent to the bifurcation. Soft and calcified plaque at the carotid bifurcation and ICA bulb. No stenosis. Cervical ICA normal beyond that. Vertebral arteries: Both vertebral artery origins are widely patent. Both vertebral arteries appear normal through the cervical region to the foramen magnum. Skeleton: Ordinary cervical spondylosis. Other neck: No worrisome mass or lymphadenopathy. Lipoma of the posterior midline neck. Upper chest: Normal Review of the MIP images confirms the above findings CTA HEAD FINDINGS Anterior circulation: Both internal carotid arteries are patent through the skull base and siphon regions. No siphon stenosis. The anterior and middle cerebral vessels are patent without large or medium vessel occlusion or stenosis. No aneurysm or vascular malformation. Posterior  circulation: Both vertebral arteries widely patent the basilar. No basilar stenosis. Posterior circulation branch vessels are patent. Venous sinuses: Patent and normal. Anatomic variants: None significant. Review of the MIP images confirms the above findings IMPRESSION: 1. No large or medium vessel occlusion. 2. Mild atherosclerotic disease at both carotid bifurcations but without stenosis. 3. These results were communicated to Dr. Otelia Limes at 10:46 amon 12/29/2021by text page via the [facility] messaging system. Aortic Atherosclerosis (ICD10-I70.0). Electronically Signed   By: Paulina Fusi M.D.   On: 11/04/2020 10:46   CT Code Stroke CTA Neck W/WO contrast  Result Date: 11/04/2020 CLINICAL DATA:  Acute stroke presentation. Dizziness and slurred speech. EXAM: CT ANGIOGRAPHY HEAD AND NECK TECHNIQUE: Multidetector CT imaging of the head and neck was performed using the standard protocol during bolus administration of intravenous contrast. Multiplanar CT image reconstructions and MIPs were obtained to evaluate the vascular anatomy. Carotid stenosis measurements (when applicable) are obtained utilizing NASCET criteria, using the distal internal carotid diameter as the denominator. CONTRAST:  41mL OMNIPAQUE IOHEXOL 350 MG/ML SOLN COMPARISON:  Head CT earlier same day FINDINGS: CTA NECK FINDINGS Aortic arch: Mild aortic atherosclerosis. Branching pattern is normal without origin stenosis. Right carotid system: Common carotid artery widely patent to the bifurcation. Mild calcified plaque at the carotid bifurcation and ICA bulb but no stenosis. Cervical ICA normal beyond that. Left carotid system: Common carotid artery widely patent to the bifurcation. Soft and calcified plaque at the carotid bifurcation and ICA bulb. No stenosis. Cervical ICA normal beyond that. Vertebral arteries: Both vertebral artery origins are widely patent. Both vertebral arteries appear normal through the cervical region to the foramen magnum.  Skeleton: Ordinary cervical spondylosis. Other neck: No worrisome mass or lymphadenopathy. Lipoma of the posterior midline neck. Upper chest: Normal Review of the MIP images confirms the above findings CTA HEAD FINDINGS Anterior circulation: Both internal carotid arteries are patent through the skull base and siphon regions. No siphon stenosis. The anterior and middle cerebral vessels are patent without large or medium vessel occlusion or stenosis. No aneurysm or vascular malformation. Posterior circulation: Both vertebral arteries widely patent the basilar. No basilar stenosis. Posterior circulation branch vessels are patent. Venous sinuses: Patent and normal. Anatomic variants: None significant. Review of the MIP images confirms the above findings IMPRESSION: 1. No large or medium vessel occlusion. 2. Mild atherosclerotic disease at both carotid bifurcations but without stenosis. 3. These results were communicated to Dr. Otelia Limes at 10:46 amon 12/29/2021by text page via the [facility] messaging system. Aortic Atherosclerosis (ICD10-I70.0). Electronically Signed  By: Nelson Chimes M.D.   On: 11/04/2020 10:46   CT HEAD CODE STROKE WO CONTRAST  Result Date: 11/04/2020 CLINICAL DATA:  Code stroke.  Slurred speech.  Dizziness. EXAM: CT HEAD WITHOUT CONTRAST TECHNIQUE: Contiguous axial images were obtained from the base of the skull through the vertex without intravenous contrast. COMPARISON:  07/29/2017 FINDINGS: Brain: Age related volume loss. Evidence of acute infarction, mass lesion, hemorrhage, hydrocephalus or extra-axial collection. Vascular: There is atherosclerotic calcification of the major vessels at the base of the brain. Skull: Negative Sinuses/Orbits: Clear/normal Other: None ASPECTS ([place] Stroke Program Early CT Score) - Ganglionic level infarction (caudate, lentiform nuclei, internal capsule, insula, M1-M3 cortex): 7 - Supraganglionic infarction (M4-M6 cortex): 3 Total score (0-10 with 10 being  normal): 10 IMPRESSION: 1. No acute finding by CT. Age related volume loss. 2. ASPECTS is 10. 3. These results were communicated to Dr. Cheral Marker at [place] 12/29/2021by text page via the [facility] messaging system. Electronically Signed   By: Nelson Chimes M.D.   On: 11/04/2020 10:27    EKG: Independently reviewed.  NSR with rate 63; RBBB; nonspecific ST changes; NSCSLT   Labs on Admission: I have personally reviewed the available labs and imaging studies at the time of the admission.  Pertinent labs:   Glucose 148 BUN 19/Creatinine 1.54/GFR 47 Normal CBC INR 1.1   Assessment/Plan Principal Problem:   Stroke-like symptoms Active Problems:   HOCM (hypertrophic obstructive cardiomyopathy) (HCC)   Hypercholesterolemia   Chronic kidney disease (CKD), stage III (moderate) (HCC)   History of testicular cancer   Bipolar affective disorder (HCC)   Possible CVA -Concerning for TIA/CVA -ABCD2 score is 4 -tPA can be given within 4.5 hours of symptom onset; this patient was not deemed to be a candidate for tPA therapy due to mild symptoms -Aspirin has been given to reduce stroke mortality and decrease morbidity -Will place in observation status for CVA/TIA evaluation -Telemetry monitoring -MRI -Echo -Risk stratification with FLP, A1c; will also check TSH -Patient will need DAPT for 21 days when ABCD2 score is at least 4 and NIH score is 3 or less, and then can transition to monotherapy with a single antiplatelet agent.  Since this patient has failed primary prevention with ASA, transition to Plavix appears to be reasonable.  Will defer to neurology for now. -Neurology consult -PT/OT/ST/Nutrition Consults  HTN -Allow permissive HTN for now -Treat BP only if >220/120, and then with goal of 15% reduction -Hold Toprol and plan to restart in 48-72 hours -Thiazide diuretics are recommended as first-line therapeutic agents vs. ACE/ARB for diabetic patients   HLD -Check FLP -Resume statin  but will change Pravachol to Lipitor 40 mg daily   H/o HOCM -Had ablation -Has AICD but it has not fired  Remote testicular cancer -s/p orchiectomy -Hold testosterone gel for now  Bipolar depression  -Significant new emotional lability on presentation, which has now resolved -Continue Wellbutrin, Lamictal, Seroquel, Effexor  Stage 3a CKD -Appears to be stable at this time -Will recheck BMP in AM    Note: This patient has been tested and is pending for the novel coronavirus COVID-19. He has been fully vaccinated against COVID-19.    DVT prophylaxis:  Lovenox  Code Status: DNR - confirmed with patient Family Communication: None present Disposition Plan:  The patient is from: home  Anticipated d/c is to: home without [facility] services   Anticipated d/c date will depend on clinical response to treatment, but possibly as early as tomorrow if hhe  has excellent response to treatment  Patient is currently: acutely ill Consults called: Neurology; PT/OT/ST/Nutrition Admission status: It is my clinical opinion that referral for OBSERVATION is reasonable and necessary in this patient based on the above information provided. The aforementioned taken together are felt to place the patient at high risk for further clinical deterioration. However it is anticipated that the patient may be medically stable for discharge from the hospital within 24 to 48 hours.    Karmen Bongo MD [facility]   How to contact the [facility] Attending or Consulting provider [place] or covering provider during after hours [place], for this patient?  1. Check the care team in [facility] and look for a) attending/consulting [facility] provider listed and b) the [facility] team listed 2. Log into [email or web address] and use [place]'s universal password to access. If you do not have the password, please contact the hospital operator. 3. Locate the [facility] provider you are looking for under [facility] and page to a number that you can be  directly reached. 4. If you still have difficulty reaching the provider, please page the [facility] (Director on Call) for the Hospitalists listed on amion for assistance.   11/04/2020, 6:48 PM

## 2020-11-04 NOTE — ED Notes (Signed)
Notified Dr. Denton Lank that pt is positive for orthostatic vital signs.

## 2020-11-04 NOTE — ED Triage Notes (Signed)
Pt BIB [facility] w/ complaints of stroke symptoms. Pt was sitting at desk at work and started to become dizzy around 0900 hrs this morning. Pt felt as though speech was slurred, became upset started crying, had coworkers call 911. Pt also became nauseated and had 3 episodes of emesis. EMS activated code stroke. VSS w/ EMS.

## 2020-11-04 NOTE — Consult Note (Signed)
Referring Physician: Dr. Denton Lank    Chief Complaint: Acute onset of dizziness and slurred speech  HPI: Michael Barton is an 74 y.o. male with a history of depression, testicular cancer s/p orchiectomy, HOCM, HLD and sleep apnea, presenting after acute onset of slurred speech and vertigo while at work today. Fellow employees found him "rolling around on the ground". EMS was called and noted same. The patient takes an occasional aspirin. He is not on a blood thinner.   LSN: [id number] tPA Given: No: NIHSS 0  Past Medical History:  Diagnosis Date  . Depression   . History of orchiectomy   . HOCM (hypertrophic obstructive cardiomyopathy) (HCC) 06/24/2016   cMRI 3/18: Severe asymm septal hypertrophy, EF 74, SAM, mod MR, turb flow sugg of significant LVOT gradient, picture c/w HOCM // Echo 6/17: Mild conc LVH, LVOT gradient 14, EF 65-70, mild MR // CP stress test 6/17: NL functional capacity compared to matched sedentary norms. Pt primarily limited by body habitus and deconditioning // Echo 9/12: EF 69.8, LVOT gradient 16   . HOCM (hypertrophic obstructive cardiomyopathy) (HCC)   . Hyperlipidemia   . Sleep apnea   . Testicular cancer [facility])     Past Surgical History:  Procedure Laterality Date  . ORCHIECTOMY    . PACEMAKER IMPLANT N/A 07/26/2017   Procedure: Pacemaker Implant;  Surgeon: Marinus Maw, MD;  Location: [facility] INVASIVE [facility];  Service: Cardiovascular;  Laterality: N/A;  . RIGHT/LEFT HEART CATH AND CORONARY ANGIOGRAPHY N/A 04/10/2017   Procedure: Right/Left Heart Cath and Coronary Angiography;  Surgeon: Yvonne Kendall, MD;  Location: [facility]VASIVE [facility];  Service: Cardiovascular;  Laterality: N/A;  . TEMPORARY PACEMAKER N/A 07/25/2017   Procedure: Temporary Pacemaker;  Surgeon: Tonny Bollman, MD;  Location: [facility] INVASIVE CV LAB;  Service: Cardiovascular;  Laterality: N/A;    Family History  Problem Relation Age of Onset  . Thyroid disease Mother   . Depression Mother   . Cancer  Father    Social History:  reports that he has never smoked. He has never used smokeless tobacco. He reports that he does not drink alcohol and does not use drugs.  Allergies:  Allergies  Allergen Reactions  . Ambien [Zolpidem Tartrate]      GERD   . Ambien [Zolpidem Tartrate] Other (See Comments)    GERD  . Prozac [Fluoxetine Hcl]     Gives a weird feeling   . Prozac [Fluoxetine] Other (See Comments)    "makes me feel weird"    Home Medications:  No current facility-administered medications on file prior to encounter.   Current Outpatient Medications on File Prior to Encounter  Medication Sig Dispense Refill  . aspirin EC 81 MG tablet Take 1 tablet (81 mg total) by mouth daily.    Marland Kitchen buPROPion (WELLBUTRIN SR) 100 MG 12 hr tablet Take 100 mg by mouth 2 (two) times daily.    Marland Kitchen ibuprofen (ADVIL,MOTRIN) 200 MG tablet Take 200 mg by mouth 2 (two) times a day.     . lamoTRIgine (LAMICTAL) 200 MG tablet Take 200 mg by mouth daily.    . metoprolol succinate (TOPROL-XL) 100 MG 24 hr tablet Take 1 tablet (100 mg total) by mouth daily. Take with or immediately following a meal. 90 tablet 3  . pravastatin (PRAVACHOL) 40 MG tablet Take 40 mg by mouth daily.    . QUEtiapine (SEROQUEL) 200 MG tablet Take 200 mg by mouth at bedtime.    . Testosterone 25 MG/2.5GM (1%) GEL  Place onto the skin.    Marland Kitchen venlafaxine XR (EFFEXOR-XR) 150 MG 24 hr capsule Take 150 mg by mouth daily with breakfast.       ROS: As per HPI. Detailed ROS deferred in the context of acuity of presentation.    Physical Examination: There were no vitals taken for this visit.  HEENT: [place]/AT Lungs: Respirations uinlabored Ext: No edema  Neurologic Examination: Mental Status: Awake and alert, but with odd affect initially, staring straight ahead with a blank facial expression. During the assessment his facial expressivity normalized somewhat. Equivocal embellishment of flattened affect. Speech with nasal quality, but no  definite dysarthria. Speech is fluent with intact comprehension and naming. Oriented x 5. Cranial Nerves: II:  Visual fields intact with no extinction to DSS. PERRL III,IV, VI: No ptosis. EOMI. No nystagmus.   V,VII: Smile symmetric, facial temp sensation equal bilaterally VIII: hearing intact to voice IX,X: No hypophonia XI: Symmetric XII: midline tongue extension  Motor: Right : Upper extremity   5/5    Left:     Upper extremity   5/5  Lower extremity   5/5     Lower extremity   5/5 No pronator drift Equivocal embellished/inconsistent drift that resolved with coaching Sensory: Temp and light touch intact throughout, bilaterally. No extinction to DSS.  Deep Tendon Reflexes:  Normoactive x 4 Toes equivocal bilaterally  Cerebellar: No ataxia with FNF or H-S bilaterally  Gait: Able to stand with own power. Small steps that appear most c/w embellishment versus joint stiffness  No results found for this or any previous visit (from the past 48 hour(s)). No results found.  Assessment: 74 y.o. male presenting with acute onset of vertigo and slurred speech 1. NIHSS 0. Question of some embellishment on exam 2. CT head negative for acute abnormality 3. CTA head and neck negative for LVO 4. Not a tPA candidate.  5. Stroke Risk Factors - HLD and sleep apnea 6. DDx for presentation includes BPPV, vestibular neuritis, posterior fossa TIA and psychogenic   Recommendations: 1. HgbA1c, fasting lipid panel 2. MRI  of the brain without contrast if his pacemaker is MRI-compatible 3. PT consult, OT consult, Speech consult 4. TTE 5. Cardiac telemetry 6. Prophylactic therapy- Start regular ASA 81 mg po qd 7. Risk factor modification 8. Telemetry monitoring 9. Frequent neuro checks 10. Continue Lamictal. He takes this medication for mood stabilization.  11. Permissive HTN x 24 hours 12. Continue pravastatin   @Electronically  signed: Dr. Kerney Elbe 11/04/2020, 10:20 AM

## 2020-11-05 ENCOUNTER — Observation Stay (HOSPITAL_COMMUNITY): Payer: Medicare Other

## 2020-11-05 DIAGNOSIS — I5033 Acute on chronic diastolic (congestive) heart failure: Secondary | ICD-10-CM | POA: Diagnosis not present

## 2020-11-05 DIAGNOSIS — R471 Dysarthria and anarthria: Secondary | ICD-10-CM | POA: Diagnosis present

## 2020-11-05 DIAGNOSIS — R42 Dizziness and giddiness: Secondary | ICD-10-CM | POA: Diagnosis not present

## 2020-11-05 DIAGNOSIS — N1831 Chronic kidney disease, stage 3a: Secondary | ICD-10-CM | POA: Diagnosis not present

## 2020-11-05 DIAGNOSIS — R29818 Other symptoms and signs involving the nervous system: Secondary | ICD-10-CM | POA: Diagnosis not present

## 2020-11-05 DIAGNOSIS — I951 Orthostatic hypotension: Secondary | ICD-10-CM | POA: Diagnosis present

## 2020-11-05 DIAGNOSIS — I10 Essential (primary) hypertension: Secondary | ICD-10-CM | POA: Diagnosis present

## 2020-11-05 DIAGNOSIS — H748X3 Other specified disorders of middle ear and mastoid, bilateral: Secondary | ICD-10-CM | POA: Diagnosis not present

## 2020-11-05 LAB — RAPID URINE DRUG SCREEN, HOSP PERFORMED
Amphetamines: NOT DETECTED
Barbiturates: NOT DETECTED
Benzodiazepines: NOT DETECTED
Cocaine: NOT DETECTED
Opiates: NOT DETECTED
Tetrahydrocannabinol: NOT DETECTED

## 2020-11-05 LAB — HEMOGLOBIN A1C
Hgb A1c MFr Bld: 5.4 % (ref 4.8–5.6)
Mean Plasma Glucose: 108.28 mg/dL

## 2020-11-05 LAB — LIPID PANEL
Cholesterol: 147 mg/dL (ref 0–200)
HDL: 45 mg/dL (ref >40)
LDL Cholesterol: 80 mg/dL (ref 0–99)
Total CHOL/HDL Ratio: 3.3 RATIO
Triglycerides: 112 mg/dL (ref <150)
VLDL: 22 mg/dL (ref 0–40)

## 2020-11-05 MED ORDER — METOPROLOL SUCCINATE ER 50 MG PO TB24: 50.0000 mg | ORAL_TABLET | Freq: Every day | ORAL | 1 refills | Status: DC

## 2020-11-05 MED ORDER — HYDROCHLOROTHIAZIDE 12.5 MG PO TABS: 12.5000 mg | ORAL_TABLET | Freq: Every day | ORAL | 1 refills | Status: DC

## 2020-11-05 MED ORDER — PRAVASTATIN SODIUM 80 MG PO TABS
80.0000 mg | ORAL_TABLET | Freq: Every day | ORAL | 1 refills | Status: AC
Start: 2020-11-05 — End: ?

## 2020-11-05 MED ORDER — HYDROCHLOROTHIAZIDE 25 MG PO TABS
25.0000 mg | ORAL_TABLET | ORAL | Status: AC
  Administered 2020-11-05: 20:00:00 25 mg via ORAL
  Filled 2020-11-05: qty 1

## 2020-11-05 NOTE — Discharge Instructions (Signed)
Heart Failure, Self Care Heart failure is a serious condition. This document explains the things you need to do to take care of yourself after a heart failure diagnosis. You may be asked to change your diet, take certain medicines, and make other lifestyle changes in order to stay as healthy as possible. Your health care provider may also give you more specific instructions. If you have problems or questions, contact your health care provider. What are the risks? Having heart failure puts you at higher risk for certain problems. These problems can get worse if you do not take good care of yourself. Problems may include:  Blood clotting problems. This may cause a stroke.  Damage to the kidneys, liver, or lungs.  Abnormal heart rhythms. Supplies needed:  Scale for monitoring weight.  Blood pressure monitor.  Notebook.  Medicines. How to care for yourself when you have heart failure Medicines Take over-the-counter and prescription medicines only as told by your health care provider. Medicines reduce the workload of your heart, slow the progression of heart failure, and improve symptoms. Take your medicines every day.  Do not stop taking your medicine unless your health care provider tells you to do so.  Do not skip any dose of medicine.  Refill your prescriptions before you run out of medicine. Eating and drinking   Eat heart-healthy foods. Talk with a dietitian to make an eating plan that is right for you. ? Choose foods that contain no trans fat and are low in saturated fat and cholesterol. Healthy choices include fresh or frozen fruits and vegetables, fish, lean meats, legumes, fat-free or low-fat dairy products, and whole-grain or high-fiber foods. ? Limit salt (sodium) if told by your health care provider. Sodium restriction may reduce symptoms of heart failure. Ask a dietitian to recommend heart-healthy seasonings. ? Use healthy cooking methods instead of frying. Healthy methods  include roasting, grilling, broiling, baking, poaching, steaming, and stir-frying.  Limit your fluid intake, if directed by your health care provider. Fluid restriction may reduce symptoms of heart failure. Alcohol use  Do not drink alcohol if: ? Your health care provider tells you not to drink. ? Your heart was damaged by alcohol, or you have severe heart failure. ? You are pregnant, may be pregnant, or are planning to become pregnant.  If you drink alcohol: ? Limit how much you use to:  0-1 drink a day for women.  0-2 drinks a day for men. ? Be aware of how much alcohol is in your drink. In the [place] one drink equals one 12 oz bottle of beer (355 mL), one 5 oz glass of wine (148 mL), or one 1 oz glass of hard liquor (44 mL). Lifestyle   Do not use any products that contain nicotine or tobacco, such as cigarettes, e-cigarettes, and chewing tobacco. If you need help quitting, ask your health care provider. ? Do not use nicotine gum or patches before talking to your health care provider.  Do not use illegal drugs.  Work with your health care provider to safely reach the right body weight.  Do physical activity if told by your health care provider. Talk to your health care provider before you begin an exercise if: ? You are an older adult. ? You have severe heart failure.  Learn to manage stress. If you need help to do this, ask your health care provider.  Participate in or seek rehabilitation as needed to keep or improve your independence and quality of life.  Plan  rest periods when you get tired. Monitoring important information   Weigh yourself every day. This will help you to notice if too much fluid is building up in your body. ? Weigh yourself every morning after you urinate and before you eat breakfast. ? Wear the same amount of clothing each time you weigh yourself. ? Record your daily weight. Provide your health care provider with your weight record.  Monitor and  record your pulse and blood pressure as told by your health care provider. Dealing with extreme temperatures  If the weather is extremely hot: ? Avoid vigorous physical activity. ? Use air conditioning or fans, or find a cooler location. ? Avoid caffeine and alcohol. ? Wear loose-fitting, lightweight, and light-colored clothing.  If the weather is extremely cold: ? Avoid vigorous activity. ? Layer your clothes. ? Wear mittens or gloves, a hat, and a scarf when you go outside. ? Avoid alcohol. Follow these instructions at home:  Stay up to date with vaccines. Pneumococcal and flu (influenza) vaccines are especially important in preventing infections of the airways.  Keep all follow-up visits as told by your health care provider. This is important. Contact a health care provider if you:  Have a rapid weight gain.  Have increasing shortness of breath.  Are unable to participate in your usual physical activities.  Get tired easily.  Cough more than normal, especially with physical activity.  Lose your appetite or feel nauseous.  Have any swelling or more swelling in areas such as your hands, feet, ankles, or abdomen.  Are unable to sleep because it is hard to breathe.  Feel like your heart is beating quickly (palpitations).  Become dizzy or light-headed when you stand up. Get help right away if you:  Have trouble breathing.  Notice or your family notices a change in your awareness, such as having trouble staying awake or concentrating.  Have pain or discomfort in your chest.  Have an episode of fainting (syncope). These symptoms may represent a serious problem that is an emergency. Do not wait to see if the symptoms will go away. Get medical help right away. Call your local emergency services (911 in the [place] Do not drive yourself to the hospital. Summary  Heart failure is a serious condition. To care for yourself, you may be asked to change your diet, take certain  medicines, and make other lifestyle changes.  Take your medicines every day. Do not stop taking them unless your health care provider tells you to do so.  Eat heart-healthy foods, such as fresh or frozen fruits and vegetables, fish, lean meats, legumes, fat-free or low-fat dairy products, and whole-grain or high-fiber foods.  Ask your health care provider if you have any alcohol restrictions. You may have to stop drinking alcohol if you have severe heart failure.  Contact your health care provider if you notice problems, such as rapid weight gain or a fast heartbeat. Get help right away if you faint, or have chest pain or trouble breathing. This information is not intended to replace advice given to you by your health care provider. Make sure you discuss any questions you have with your health care provider. Document Revised: 02/05/2019 Document Reviewed: 02/06/2019 [employer] Patient Education  2020 [employer]  

## 2020-11-05 NOTE — ED Notes (Signed)
Pt in MRI at this time 

## 2020-11-05 NOTE — ED Notes (Signed)
Placed on hospital bed. Pt ambulated to bathroom on his own power, gait even and steady. Pt back in hospital bed with monitoring devices. No needs voiced at this time

## 2020-11-05 NOTE — Evaluation (Signed)
Physical Therapy Evaluation Patient Details Name: Michael Barton MRN: [id number] DOB: [birth year] Today's Date: 11/05/2020   History of Present Illness  Pt is 74 yo male with PMH including testicular CA, OSA, HLD, pacemaker, and bipolar depression.  Pt presented with dizziness and dysarthria.  Admitted for CVA workup.  MRI negative.  Clinical Impression  Pt admitted for CVA workup with symptoms of dysarthria and dizziness that have now resolved.  Pt presents with independent mobility and no episodes of dizziness.  He is at baseline mobility.  No acute PT needs.     Follow Up Recommendations No PT follow up    Equipment Recommendations  None recommended by PT    Recommendations for Other Services       Precautions / Restrictions Precautions Precautions: None      Mobility  Bed Mobility Overal bed mobility: Independent                  Transfers Overall transfer level: Independent                  Ambulation/Gait Ambulation/Gait assistance: Supervision Gait Distance (Feet): 400 Feet Assistive device: None Gait Pattern/deviations: WFL(Within Functional Limits)     General Gait Details: Had supervision but able to perform safely with normal pattern and speed  Stairs            Wheelchair Mobility    Modified Rankin (Stroke Patients Only) Modified Rankin (Stroke Patients Only) Pre-Morbid Rankin Score: No symptoms Modified Rankin: No symptoms     Balance Overall balance assessment: Independent   Sitting balance-Leahy Scale: Normal       Standing balance-Leahy Scale: Normal               High level balance activites: Side stepping;Backward walking;Direction changes;Turns;Sudden stops;Head turns High Level Balance Comments: All above without difficulty Standardized Balance Assessment Standardized Balance Assessment : Dynamic Gait Index   Dynamic Gait Index Level Surface: Normal Change in Gait Speed: Normal Gait with  Horizontal Head Turns: Normal Gait with Vertical Head Turns: Normal Gait and Pivot Turn: Normal Step Over Obstacle: Normal Step Around Obstacles: Normal Steps: Normal Total Score: 24       Pertinent Vitals/Pain Pain Assessment: No/denies pain    Home Living Family/patient expects to be discharged to:: Private residence Living Arrangements: Other (Comment) (housemate) Available Help at Discharge: Family;Available 24 hours/day Type of Home: House Home Access: Stairs to enter Entrance Stairs-Rails: None Entrance Stairs-Number of Steps: 2 Home Layout: Two level;Able to live on main level with bedroom/bathroom Home Equipment: None      Prior Function Level of Independence: Independent         Comments: Very independent and likes hike and bike (road and mountain[place]  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     Hand Dominance        Extremity/Trunk Assessment   Upper Extremity Assessment Upper Extremity Assessment: Overall WFL for tasks assessed (Strength 5/5, ROM WFL, coordination WNL, sensation intact)    Lower Extremity Assessment Lower Extremity Assessment: Overall WFL for tasks assessed (Strength 5/5, ROM WFL, coordination WNL, sensation intact)    Cervical / Trunk Assessment Cervical / Trunk Assessment: Normal  Communication   Communication: No difficulties  Cognition Arousal/Alertness: Awake/alert Behavior During Therapy: WFL for tasks assessed/performed Overall Cognitive Status: Within Functional Limits for tasks assessed  General Comments General comments (skin integrity, edema, etc.): No dizziness during therapy.  Orthostatic BP was stable.  No dizziness with head turns, EOEM, turns, or transfers.    Exercises     Assessment/Plan    PT Assessment Patent does not need any further PT services  PT Problem List         PT Treatment Interventions      PT Goals (Current goals can be found in the Care Plan  section)  Acute Rehab PT Goals PT Goal Formulation: All assessment and education complete, DC therapy    Frequency     Barriers to discharge        Co-evaluation               AM-PAC PT "6 Clicks" Mobility  Outcome Measure Help needed turning from your back to your side while in a flat bed without using bedrails?: None Help needed moving from lying on your back to sitting on the side of a flat bed without using bedrails?: None Help needed moving to and from a bed to a chair (including a wheelchair)?: None Help needed standing up from a chair using your arms (e.g., wheelchair or bedside chair)?: None Help needed to walk in hospital room?: None Help needed climbing 3-5 steps with a railing? : None 6 Click Score: 24    End of Session Equipment Utilized During Treatment: Gait belt Activity Tolerance: Patient tolerated treatment well Patient left: in bed;with call bell/phone within reach Nurse Communication: Mobility status      Time: 1248-1310 PT Time Calculation (min) (ACUTE ONLY): 22 min   Charges:   PT Evaluation $PT Eval Moderate Complexity: 1 Mod          Mailynn Everly, PT Acute Rehab Services Pager [phone] [facility] [phone]    Rayetta Humphrey 11/05/2020, 1:55 PM

## 2020-11-05 NOTE — ED Notes (Signed)
Patient in MRI at this time. 

## 2020-11-05 NOTE — ED Notes (Signed)
Tele Breakfast order placed

## 2020-11-05 NOTE — Discharge Summary (Addendum)
Discharge Summary  Michael Barton:[id number] DOB: Mar 28, 1946  PCP: Blair Heys, MD  Admit date: 11/04/2020 Discharge date: 11/05/2020  Time spent: 45 minutes  Recommendations for Outpatient Follow-up:  1. Medication change: Metoprolol decreased from 100 mg to 50 mg p.o. daily 2. Medication change: Pravachol increased from 40 mg to 80 mg p.o. daily 3. New medication: HCTZ 12.5 mg p.o. daily 4. Patient will follow up with his PCP in approximately 1 month. 5. He will follow-up with his cardiologist in 2 weeks.  Discharge Diagnoses:  Active Hospital Problems   Diagnosis Date Noted  . Stroke-like symptoms 11/04/2020  . Essential hypertension 11/05/2020  . Orthostatic hypotension 11/05/2020  . Dysarthria 11/05/2020  . Acute on chronic diastolic CHF (congestive heart failure) (HCC) 11/05/2020  . Chronic kidney disease (CKD), stage III (moderate) (HCC) 10/27/2017  . Hypercholesterolemia 07/29/2017  . History of testicular cancer 06/01/2017  . HOCM (hypertrophic obstructive cardiomyopathy) (HCC) 06/24/2016  . Bipolar affective disorder (HCC) 11/16/2011    Resolved Hospital Problems  No resolved problems to display.    Discharge Condition: Improved, being discharged home  Diet recommendation: Heart healthy  Vitals:   11/05/20 1757 11/05/20 1830  BP: (!) 180/162 (!) 151/95  Pulse: 74 72  Resp: 20 (!) 21  Temp:    SpO2: 97% 96%    History of present illness:  Patient is a 74 year old with past medical history of testicular cancer, sleep apnea, hyperlipidemia, hyper obstructive cardiomyopathy with rhythm monitor placement and bipolar disorder who presented to the emergency room with complaints of dizziness that started this morning.  He said he had a hard time talking and the words would come out somewhat garbled.  In the emergency room, blood pressure initially on admission was normal and patient was brought in the potential code stroke.  His symptoms resolved after  he was hospitalized.  He was started on fluids.  Neurology was consulted and patient underwent a stroke work-up.  Hospital Course:  Principal Problem:   Stroke-like symptoms with dysarthria: Patient stroke work-up was unremarkable.  Echocardiogram did note some mild diastolic dysfunction.  MRI and CT were negative.  I discussed this with the patient.  He is certainly possible that he may have had TIA however it was unusual was that his blood pressure was normal on admission but then started trending upwards and became elevated throughout the day while he was in the emergency room and receiving IV fluids.  Covering the fact that he may have had a TIA, but the patient on a daily aspirin and increased his pravastatin from 40 mg to 80 mg.  His LDL was at 85 with a target rate to be below 70.  He is supposed already be on a daily aspirin, but he says he rarely takes this and should be more consistent.  That said, I do not think that he actually had a TIA given stable blood pressure on admission.  I suspect that patient likely had some orthostatic hypotension from what he was describing feeling more lightheaded and worse when he was sitting upright.  When the EMS came and he was laid down, he started to feel better at that point.  In review of his medications, he is on a beta-blocker and his heart rate was right at 60.  He is also on Wellbutrin in the combination can potentiate beta-blocker effects.  We agreed for now to decrease his beta-blocker and cover him with a low-dose diuretic of HCTZ to make up for this.  Patient will follow up with his cardiologist in a few weeks to see if he still having symptoms and how his blood pressure is doing.  In addition, patient has started taking testosterone in the past few months and he is on Seroquel, both of which can also cause orthostatic hypotension so if his symptoms are persistent, thought could be given to adjusting none of these other medications.  By the time I  saw the patient, work-up was complete and he was feeling much better, talking clearly without any type of confusion and blood pressure is slightly improved, especially after getting some HCTZ.  We discussed the different possibilities of what this could be and he was amenable to this plan.  He will follow-up with his PCP and with outpatient cardiology. Active Problems:   HOCM (hypertrophic obstructive cardiomyopathy) (HCC)   Hypercholesterolemia   Chronic kidney disease (CKD), stage IIIb (moderate) ([place]): Stable   History of testicular cancer   Bipolar affective disorder ([place]): Continue Seroquel for now and Wellbutrin.  See above as possible medication interactions.    Essential hypertension with suspected orthostatic hypotension: As above.  Have decreased beta-blocker and started HCTZ.    Acute on chronic diastolic CHF (congestive heart failure) ([place]): Diastolic dysfunction incidentally noted on echocardiogram during stroke work-up.  As he received fluids throughout the day, blood pressure that was high as [id number] systolic.  It was normal on admission.  Discussed this with the patient and we started him on some hydrochlorothiazide both upon discharge and received a dose prior to discharge.  Did not start ACE inhibitor given chronic renal disease.   Procedures:  Echocardiogram done [id number]: Diastolic dysfunction  Consultations:  Neurology  Discharge Exam: BP (!) 151/95   Pulse 72   Temp 97.9 F (36.6 C) (Oral)   Resp (!) 21   Ht 6\' 3"  (1.905 m)   Wt 95.3 kg   SpO2 96%   BMI 26.25 kg/m   General: Alert and oriented x3, no acute distress Cardiovascular: Regular rate and rhythm, S1-S2 Respiratory: Clear to auscultation bilaterally  Discharge Instructions You were cared for by a hospitalist during your hospital stay. If you have any questions about your discharge medications or the care you received while you were in the hospital after you are discharged, you can call the unit and asked  to speak with the hospitalist on call if the hospitalist that took care of you is not available. Once you are discharged, your primary care physician will handle any further medical issues. Please note that NO REFILLS for any discharge medications will be authorized once you are discharged, as it is imperative that you return to your primary care physician (or establish a relationship with a primary care physician if you do not have one) for your aftercare needs so that they can reassess your need for medications and monitor your lab values.  Discharge Instructions    Diet - low sodium heart healthy   Complete by: As directed    Increase activity slowly   Complete by: As directed      Allergies as of 11/05/2020      Reactions   Ambien [zolpidem Tartrate] Other (See Comments)   GERD   Prozac [fluoxetine] Other (See Comments)   "makes me feel weird"      Medication List    TAKE these medications   aspirin EC 81 MG tablet Take 1 tablet (81 mg total) by mouth daily.   buPROPion 100 MG 12 hr tablet Commonly known  as: WELLBUTRIN SR Take 100 mg by mouth 2 (two) times daily.   hydrochlorothiazide 12.5 MG tablet Commonly known as: HYDRODIURIL Take 1 tablet (12.5 mg total) by mouth daily.   ibuprofen 200 MG tablet Commonly known as: ADVIL Take 200 mg by mouth 2 (two) times a day.   lamoTRIgine 200 MG tablet Commonly known as: LAMICTAL Take 200 mg by mouth daily.   metoprolol succinate 50 MG 24 hr tablet Commonly known as: TOPROL-XL Take 1 tablet (50 mg total) by mouth daily. Take with or immediately following a meal. What changed:   medication strength  how much to take   pravastatin 80 MG tablet Commonly known as: PRAVACHOL Take 1 tablet (80 mg total) by mouth daily. What changed:   medication strength  how much to take   QUEtiapine 200 MG tablet Commonly known as: SEROQUEL Take 200 mg by mouth at bedtime.   Testosterone 1.62 % Gel Apply 40.5 mg topically in the  morning.   venlafaxine XR 150 MG 24 hr capsule Commonly known as: EFFEXOR-XR Take 150 mg by mouth daily with breakfast.      Allergies  Allergen Reactions  . Ambien [Zolpidem Tartrate] Other (See Comments)    GERD  . Prozac [Fluoxetine] Other (See Comments)    "makes me feel weird"    Follow-up Information    Gaynelle Arabian, MD .   Specialty: Family Medicine Contact information: [place] [place] [address] [phone]        Nahser, Wonda Cheng, MD. Schedule an appointment as soon as possible for a visit in 2 week(s).   Specialty: Cardiology Contact information: [place] [address] [phone]                The results of significant diagnostics from this hospitalization (including imaging, microbiology, ancillary and laboratory) are listed below for reference.    Significant Diagnostic Studies: CT Code Stroke CTA Head W/WO contrast  Result Date: 11/04/2020 CLINICAL DATA:  Acute stroke presentation. Dizziness and slurred speech. EXAM: CT ANGIOGRAPHY HEAD AND NECK TECHNIQUE: Multidetector CT imaging of the head and neck was performed using the standard protocol during bolus administration of intravenous contrast. Multiplanar CT image reconstructions and MIPs were obtained to evaluate the vascular anatomy. Carotid stenosis measurements (when applicable) are obtained utilizing NASCET criteria, using the distal internal carotid diameter as the denominator. CONTRAST:  3mL OMNIPAQUE IOHEXOL 350 MG/ML SOLN COMPARISON:  Head CT earlier same day FINDINGS: CTA NECK FINDINGS Aortic arch: Mild aortic atherosclerosis. Branching pattern is normal without origin stenosis. Right carotid system: Common carotid artery widely patent to the bifurcation. Mild calcified plaque at the carotid bifurcation and ICA bulb but no stenosis. Cervical ICA normal beyond that. Left carotid system: Common carotid artery widely patent to the  bifurcation. Soft and calcified plaque at the carotid bifurcation and ICA bulb. No stenosis. Cervical ICA normal beyond that. Vertebral arteries: Both vertebral artery origins are widely patent. Both vertebral arteries appear normal through the cervical region to the foramen magnum. Skeleton: Ordinary cervical spondylosis. Other neck: No worrisome mass or lymphadenopathy. Lipoma of the posterior midline neck. Upper chest: Normal Review of the MIP images confirms the above findings CTA HEAD FINDINGS Anterior circulation: Both internal carotid arteries are patent through the skull base and siphon regions. No siphon stenosis. The anterior and middle cerebral vessels are patent without large or medium vessel occlusion or stenosis. No aneurysm or vascular malformation. Posterior circulation: Both vertebral  arteries widely patent the basilar. No basilar stenosis. Posterior circulation branch vessels are patent. Venous sinuses: Patent and normal. Anatomic variants: None significant. Review of the MIP images confirms the above findings IMPRESSION: 1. No large or medium vessel occlusion. 2. Mild atherosclerotic disease at both carotid bifurcations but without stenosis. 3. These results were communicated to Dr. Cheral Marker at 10:46 amon 12/29/2021by text page via the [facility] messaging system. Aortic Atherosclerosis (ICD10-I70.0). Electronically Signed   By: Nelson Chimes M.D.   On: 11/04/2020 10:46   CT Code Stroke CTA Neck W/WO contrast  Result Date: 11/04/2020 CLINICAL DATA:  Acute stroke presentation. Dizziness and slurred speech. EXAM: CT ANGIOGRAPHY HEAD AND NECK TECHNIQUE: Multidetector CT imaging of the head and neck was performed using the standard protocol during bolus administration of intravenous contrast. Multiplanar CT image reconstructions and MIPs were obtained to evaluate the vascular anatomy. Carotid stenosis measurements (when applicable) are obtained utilizing NASCET criteria, using the distal internal  carotid diameter as the denominator. CONTRAST:  9mL OMNIPAQUE IOHEXOL 350 MG/ML SOLN COMPARISON:  Head CT earlier same day FINDINGS: CTA NECK FINDINGS Aortic arch: Mild aortic atherosclerosis. Branching pattern is normal without origin stenosis. Right carotid system: Common carotid artery widely patent to the bifurcation. Mild calcified plaque at the carotid bifurcation and ICA bulb but no stenosis. Cervical ICA normal beyond that. Left carotid system: Common carotid artery widely patent to the bifurcation. Soft and calcified plaque at the carotid bifurcation and ICA bulb. No stenosis. Cervical ICA normal beyond that. Vertebral arteries: Both vertebral artery origins are widely patent. Both vertebral arteries appear normal through the cervical region to the foramen magnum. Skeleton: Ordinary cervical spondylosis. Other neck: No worrisome mass or lymphadenopathy. Lipoma of the posterior midline neck. Upper chest: Normal Review of the MIP images confirms the above findings CTA HEAD FINDINGS Anterior circulation: Both internal carotid arteries are patent through the skull base and siphon regions. No siphon stenosis. The anterior and middle cerebral vessels are patent without large or medium vessel occlusion or stenosis. No aneurysm or vascular malformation. Posterior circulation: Both vertebral arteries widely patent the basilar. No basilar stenosis. Posterior circulation branch vessels are patent. Venous sinuses: Patent and normal. Anatomic variants: None significant. Review of the MIP images confirms the above findings IMPRESSION: 1. No large or medium vessel occlusion. 2. Mild atherosclerotic disease at both carotid bifurcations but without stenosis. 3. These results were communicated to Dr. Cheral Marker at 10:46 amon 12/29/2021by text page via the [facility] messaging system. Aortic Atherosclerosis (ICD10-I70.0). Electronically Signed   By: Nelson Chimes M.D.   On: 11/04/2020 10:46   MR BRAIN WO CONTRAST  Result Date:  11/05/2020 CLINICAL DATA:  Neuro deficit, acute stroke suspected.  Dizzy. EXAM: MRI HEAD WITHOUT CONTRAST TECHNIQUE: Multiplanar, multiecho pulse sequences of the brain and surrounding structures were obtained without intravenous contrast. COMPARISON:  CT from December 29, 21. FINDINGS: Brain: No acute infarction, hemorrhage, hydrocephalus, extra-axial collection or mass lesion. Mild for age scattered T2/FLAIR hyperintensities, likely related to chronic microvascular ischemic disease. Vascular: Major arterial flow voids are maintained at the skull base. Skull and upper cervical spine: Normal marrow signal. Sinuses/Orbits: Sinuses are clear.  Unremarkable orbits. Other: Trace bilateral mastoid effusions. IMPRESSION: No evidence of acute intracranial abnormality. Specifically, no acute infarct. Electronically Signed   By: Margaretha Sheffield MD   On: 11/05/2020 11:44   ECHOCARDIOGRAM COMPLETE  Result Date: 11/04/2020    ECHOCARDIOGRAM REPORT   Patient Name:   TAVISH MCGOEY [facility] Date of Exam: 11/04/2020 Medical  Rec #:  [id number]          Height:       75.0 in Accession #:    [id number]         Weight:       210.0 lb Date of Birth:  January 23, 1946         BSA:          2.240 m Patient Age:    63 years           BP:           158/85 mmHg Patient Gender: M                  HR:           74 bpm. Exam Location:  Inpatient Procedure: 2D Echo, Color Doppler and Cardiac Doppler Indications:    Stroke 434.91 / I163.9  History:        Patient has prior history of Echocardiogram examinations, most                 recent 07/25/2017. Cardiomyopathy; Risk Factors:Dyslipidemia.  Sonographer:    Bernadene Person RDCS Referring Phys: [place]  1. Left ventricular ejection fraction, by estimation, is 65 to 70%. The left ventricle has normal function. The left ventricle has no regional wall motion abnormalities. There is moderate asymmetric left ventricular hypertrophy of the basal-septal segment. Left ventricular  diastolic parameters are consistent with Grade I diastolic dysfunction (impaired relaxation).  2. Right ventricular systolic function is normal. The right ventricular size is normal. There is normal pulmonary artery systolic pressure. The estimated right ventricular systolic pressure is [id number] mmHg.  3. The mitral valve is normal in structure. Trivial mitral valve regurgitation. No evidence of mitral stenosis.  4. The aortic valve is tricuspid. Aortic valve regurgitation is not visualized. No aortic stenosis is present.  5. The inferior vena cava is normal in size with greater than 50% respiratory variability, suggesting right atrial pressure of 3 mmHg. FINDINGS  Left Ventricle: Left ventricular ejection fraction, by estimation, is 65 to 70%. The left ventricle has normal function. The left ventricle has no regional wall motion abnormalities. The left ventricular internal cavity size was normal in size. There is  moderate asymmetric left ventricular hypertrophy of the basal-septal segment. Left ventricular diastolic parameters are consistent with Grade I diastolic dysfunction (impaired relaxation). Right Ventricle: The right ventricular size is normal. No increase in right ventricular wall thickness. Right ventricular systolic function is normal. There is normal pulmonary artery systolic pressure. The tricuspid regurgitant velocity is 2.40 m/s, and  with an assumed right atrial pressure of 3 mmHg, the estimated right ventricular systolic pressure is [id number] mmHg. Left Atrium: Left atrial size was normal in size. Right Atrium: Right atrial size was normal in size. Pericardium: Trivial pericardial effusion is present. Mitral Valve: The mitral valve is normal in structure. Trivial mitral valve regurgitation. No evidence of mitral valve stenosis. Tricuspid Valve: The tricuspid valve is normal in structure. Tricuspid valve regurgitation is trivial. Aortic Valve: The aortic valve is tricuspid. Aortic valve regurgitation is not  visualized. No aortic stenosis is present. Pulmonic Valve: The pulmonic valve was not well visualized. Pulmonic valve regurgitation is not visualized. Aorta: The aortic root and ascending aorta are structurally normal, with no evidence of dilitation. Venous: The inferior vena cava is normal in size with greater than 50% respiratory variability, suggesting right atrial pressure of 3 mmHg. IAS/Shunts: The interatrial septum was not well  visualized.  LEFT VENTRICLE PLAX 2D LVIDd:         5.10 cm  Diastology LVIDs:         3.20 cm  LV e' medial:    4.56 cm/s LV PW:         1.10 cm  LV E/e' medial:  12.5 LV IVS:        1.30 cm  LV e' lateral:   7.27 cm/s LVOT diam:     2.00 cm  LV E/e' lateral: 7.8 LV SV:         92 LV SV Index:   41 LVOT Area:     3.14 cm  RIGHT VENTRICLE RV S prime:     12.80 cm/s TAPSE (M-mode): 2.1 cm LEFT ATRIUM             Index       RIGHT ATRIUM           Index LA diam:        4.10 cm 1.83 cm/m  RA Area:     14.40 cm LA Vol (A2C):   49.8 ml 22.23 ml/m RA Volume:   29.00 ml  12.94 ml/m LA Vol (A4C):   52.1 ml 23.26 ml/m LA Biplane Vol: 52.8 ml 23.57 ml/m  AORTIC VALVE LVOT Vmax:   150.00 cm/s LVOT Vmean:  97.500 cm/s LVOT VTI:    0.293 m  AORTA Ao Root diam: 3.30 cm Ao Asc diam:  3.20 cm MITRAL VALVE               TRICUSPID VALVE MV Area (PHT): 1.71 cm    TR Peak grad:   23.0 mmHg MV Decel Time: 444 msec    TR Vmax:        240.00 cm/s MV E velocity: 56.80 cm/s MV A velocity: 94.80 cm/s  SHUNTS MV E/A ratio:  0.60        Systemic VTI:  0.29 m                            Systemic Diam: 2.00 cm Oswaldo Milian MD Electronically signed by Oswaldo Milian MD Signature Date/Time: 11/04/2020/9:35:41 PM    Final    CT HEAD CODE STROKE WO CONTRAST  Result Date: 11/04/2020 CLINICAL DATA:  Code stroke.  Slurred speech.  Dizziness. EXAM: CT HEAD WITHOUT CONTRAST TECHNIQUE: Contiguous axial images were obtained from the base of the skull through the vertex without intravenous contrast.  COMPARISON:  07/29/2017 FINDINGS: Brain: Age related volume loss. Evidence of acute infarction, mass lesion, hemorrhage, hydrocephalus or extra-axial collection. Vascular: There is atherosclerotic calcification of the major vessels at the base of the brain. Skull: Negative Sinuses/Orbits: Clear/normal Other: None ASPECTS ([place] Stroke Program Early CT Score) - Ganglionic level infarction (caudate, lentiform nuclei, internal capsule, insula, M1-M3 cortex): 7 - Supraganglionic infarction (M4-M6 cortex): 3 Total score (0-10 with 10 being normal): 10 IMPRESSION: 1. No acute finding by CT. Age related volume loss. 2. ASPECTS is 10. 3. These results were communicated to Dr. Cheral Marker at [place] 12/29/2021by text page via the [facility] messaging system. Electronically Signed   By: Nelson Chimes M.D.   On: 11/04/2020 10:27    Microbiology: Recent Results (from the past 240 hour(s))  SARS CORONAVIRUS 2 (TAT 6-24 HRS) Nasopharyngeal Nasopharyngeal Swab     Status: None   Collection Time: 11/04/20  1:58 PM   Specimen: Nasopharyngeal Swab  Result Value Ref Range Status  SARS Coronavirus 2 NEGATIVE NEGATIVE Final    Comment: (NOTE) SARS-CoV-2 target nucleic acids are NOT DETECTED.  The SARS-CoV-2 RNA is generally detectable in upper and lower respiratory specimens during the acute phase of infection. Negative results do not preclude SARS-CoV-2 infection, do not rule out co-infections with other pathogens, and should not be used as the sole basis for treatment or other patient management decisions. Negative results must be combined with clinical observations, patient history, and epidemiological information. The expected result is Negative.  Fact Sheet for Patients: SugarRoll.be  Fact Sheet for Healthcare Providers: https://www.woods-mathews.com/  This test is not yet approved or cleared by the [place] FDA and  has been authorized for detection and/or  diagnosis of SARS-CoV-2 by FDA under an Emergency Use Authorization (EUA). This EUA will remain  in effect (meaning this test can be used) for the duration of the COVID-19 declaration under Se ction 564(b)(1) of the Act, 21 U.S.C. section 360bbb-3(b)(1), unless the authorization is terminated or revoked sooner.  Performed at [facility], [place] [address], [place], [place] [address]      Labs: Basic Metabolic Panel: Recent Labs  Lab 11/04/20 1015 11/04/20 1021  NA 132* 133*  K 3.7 3.8  CL 98 96*  CO2 24  --   GLUCOSE 148* 141*  BUN 19 22  CREATININE 1.54* 1.50*  CALCIUM 8.8*  --    Liver Function Tests: Recent Labs  Lab 11/04/20 1015  AST 21  ALT 20  ALKPHOS 55  BILITOT 0.2*  PROT 6.1*  ALBUMIN 4.0   No results for input(s): LIPASE, AMYLASE in the last 168 hours. No results for input(s): AMMONIA in the last 168 hours. CBC: Recent Labs  Lab 11/04/20 1015 11/04/20 1021  WBC 4.4  --   NEUTROABS 2.2  --   HGB 13.7 13.6  HCT 40.7 40.0  MCV 88.5  --   PLT 219  --    Cardiac Enzymes: No results for input(s): CKTOTAL, CKMB, CKMBINDEX, TROPONINI in the last 168 hours. BNP: BNP (last 3 results) No results for input(s): BNP in the last 8760 hours.  ProBNP (last 3 results) No results for input(s): PROBNP in the last 8760 hours.  CBG: Recent Labs  Lab 11/04/20 1015  GLUCAP 133*       Signed:  Annita Brod, MD [facility] 11/05/2020, 7:38 PM

## 2020-11-05 NOTE — Progress Notes (Signed)
Informed of MRI for today.   Device system confirmed to be MRI conditional, with implant date > 6 weeks ago and no evidence of abandoned or epicardial leads in review of most recent CXR Interrogation from today reviewed, pt is currently AP-VS at ~70 bpm Change device settings for MRI to DOO at 85 bpm  Tachy-therapies to off if applicable.  Program device back to pre-MRI settings after completion of exam.  Michael Barton  11/05/2020 10:55 AM

## 2020-11-05 NOTE — ED Notes (Signed)
Pt resting quietly in bed, no needs voiced at this time, will continue to monitor

## 2020-11-05 NOTE — Progress Notes (Signed)
Per order, changed device settings for MRI to DOO at 85 bpm   Tachy-therapies to off if applicable.   SWOT RN staying to monitor patient during scan.  Will program device back to pre-MRI settings after completion of exam.

## 2020-11-17 NOTE — Progress Notes (Deleted)
{Choose 1 Note Type (Video or Telephone):[phone]}    Date:  11/17/2020   ID:  Michael Barton, DOB 1946-08-26, MRN [id number] The patient was identified using 2 identifiers.  {Patient Location:[phone]::"Home"} {Provider Location:[id number]::"Home Office"}  PCP:  Gaynelle Arabian, MD  Cardiologist:  Mertie Moores, MD  Electrophysiologist:  Cristopher Peru, MD   Evaluation Performed:  {Choose Visit VOHY:[id number]::"SWNIOE-VO Visit"}  Chief Complaint: Hospital follow up  History of Present Illness:    Michael Barton is a 75 y.o. male with HOCM s/p alcohol septal ablation at [facility] complicated by syncope after week of ablation who was found to have CHB s/p PPM. Other problem includes orthostatic hypotension, HLD, testicular cancer and OSA.   Admitted 10/2020 with Stroke like symptoms with dysarthria. Stoke work up unremarkable. May had TIA but normal BP on admit. Could be orthostatic hypotension. Reduced BB and added HCTZ.   Seen virtually for follow up.   The patient {does/does not:200015} have symptoms concerning for COVID-19 infection (fever, chills, cough, or new shortness of breath).    Past Medical History:  Diagnosis Date  . Depression   . HOCM (hypertrophic obstructive cardiomyopathy) ([place]) 06/24/2016   cMRI 3/18: Severe asymm septal hypertrophy, EF 74, SAM, mod MR, turb flow sugg of significant LVOT gradient, picture c/w HOCM // Echo 6/17: Mild conc LVH, LVOT gradient 14, EF 65-70, mild MR // CP stress test 6/17: NL functional capacity compared to matched sedentary norms. Pt primarily limited by body habitus and deconditioning // Echo 9/12: EF 69.8, LVOT gradient 16   . Hyperlipidemia   . Sleep apnea   . Testicular cancer ([place]) 1990   h/o orchiectomy   Past Surgical History:  Procedure Laterality Date  . ORCHIECTOMY    . PACEMAKER IMPLANT N/A 07/26/2017   Procedure: Pacemaker Implant;  Surgeon: Evans Lance, MD;  Location: [place] [facility];  Service:  Cardiovascular;  Laterality: N/A;  . RIGHT/LEFT HEART CATH AND CORONARY ANGIOGRAPHY N/A 04/10/2017   Procedure: Right/Left Heart Cath and Coronary Angiography;  Surgeon: Nelva Bush, MD;  Location: [place] [facility];  Service: Cardiovascular;  Laterality: N/A;  . TEMPORARY PACEMAKER N/A 07/25/2017   Procedure: Temporary Pacemaker;  Surgeon: Sherren Mocha, MD;  Location: [place] [facility];  Service: Cardiovascular;  Laterality: N/A;     No outpatient medications have been marked as taking for the 11/18/20 encounter (Appointment) with Leanor Kail, PA.     Allergies:   Ambien [zolpidem tartrate] and Prozac [fluoxetine]   Social History   Tobacco Use  . Smoking status: Never Smoker  . Smokeless tobacco: Never Used  Vaping Use  . Vaping Use: Never used  Substance Use Topics  . Alcohol use: Yes    Comment: minimal, maybe 1 a week  . Drug use: No     Family Hx: The patient's family history includes Cancer in his father; Depression in his mother; Thyroid disease in his mother. There is no history of Stroke.  ROS:   Please see the history of present illness.    *** All other systems reviewed and are negative.   Prior CV studies:   The following studies were reviewed today:  Echo 10/2020 1. Left ventricular ejection fraction, by estimation, is 65 to 70%. The  left ventricle has normal function. The left ventricle has no regional  wall motion abnormalities. There is moderate asymmetric left ventricular  hypertrophy of the basal-septal  segment. Left ventricular diastolic parameters are consistent with Grade I  diastolic dysfunction (impaired relaxation).  2. Right ventricular systolic function is normal. The right ventricular  size is normal. There is normal pulmonary artery systolic pressure. The  estimated right ventricular systolic pressure is 20.2 mmHg.  3. The mitral valve is normal in structure. Trivial mitral valve  regurgitation. No evidence of mitral  stenosis.  4. The aortic valve is tricuspid. Aortic valve regurgitation is not  visualized. No aortic stenosis is present.  5. The inferior vena cava is normal in size with greater than 50%  respiratory variability, suggesting right atrial pressure of 3 mmHg.   Labs/Other Tests and Data Reviewed:    EKG:  {EKG/Telemetry Strips Reviewed:[phone]}  Recent Labs: 11/04/2020: ALT 20; BUN 22; Creatinine, Ser 1.50; Hemoglobin 13.6; Platelets 219; Potassium 3.8; Sodium 133   Recent Lipid Panel Lab Results  Component Value Date/Time   CHOL 147 11/05/2020 04:04 AM   TRIG 112 11/05/2020 04:04 AM   HDL 45 11/05/2020 04:04 AM   CHOLHDL 3.3 11/05/2020 04:04 AM   LDLCALC 80 11/05/2020 04:04 AM    Wt Readings from Last 3 Encounters:  11/04/20 210 lb (95.3 kg)  04/08/20 205 lb (93 kg)  04/02/20 206 lb (93.4 kg)     Risk Assessment/Calculations:   {Does this patient have ATRIAL FIBRILLATION?[phone]}  Objective:    Vital Signs:  There were no vitals taken for this visit.   {HeartCare Virtual Exam (Optional):[id number]::"VITAL SIGNS:  reviewed"}  ASSESSMENT & PLAN:    1. ***   {Are you ordering a CV Procedure (e.g. stress test, cath, DCCV, TEE, etc)?   Press F2        :[id number]}    COVID-19 Education: The signs and symptoms of COVID-19 were discussed with the patient and how to seek care for testing (follow up with PCP or arrange E-visit).  ***The importance of social distancing was discussed today.  Time:   Today, I have spent *** minutes with the patient with telehealth technology discussing the above problems.     Medication Adjustments/Labs and Tests Ordered: Current medicines are reviewed at length with the patient today.  Concerns regarding medicines are outlined above.   Tests Ordered: No orders of the defined types were placed in this encounter.   Medication Changes: No orders of the defined types were placed in this encounter.   Follow Up:  {F/U  Format:[phone]} {follow up:15908}  Jarrett Soho, PA  11/17/2020 8:11 PM    [facility]

## 2020-11-18 ENCOUNTER — Telehealth: Payer: Medicare Other | Admitting: Physician Assistant

## 2020-11-18 ENCOUNTER — Other Ambulatory Visit: Payer: Self-pay

## 2020-12-07 DIAGNOSIS — F322 Major depressive disorder, single episode, severe without psychotic features: Secondary | ICD-10-CM | POA: Diagnosis not present

## 2020-12-07 DIAGNOSIS — N183 Chronic kidney disease, stage 3 unspecified: Secondary | ICD-10-CM | POA: Diagnosis not present

## 2020-12-07 DIAGNOSIS — E7800 Pure hypercholesterolemia, unspecified: Secondary | ICD-10-CM | POA: Diagnosis not present

## 2020-12-18 DIAGNOSIS — F322 Major depressive disorder, single episode, severe without psychotic features: Secondary | ICD-10-CM | POA: Diagnosis not present

## 2020-12-18 DIAGNOSIS — N183 Chronic kidney disease, stage 3 unspecified: Secondary | ICD-10-CM | POA: Diagnosis not present

## 2020-12-18 DIAGNOSIS — E7800 Pure hypercholesterolemia, unspecified: Secondary | ICD-10-CM | POA: Diagnosis not present

## 2020-12-23 ENCOUNTER — Encounter: Payer: Self-pay | Admitting: Physician Assistant

## 2020-12-23 ENCOUNTER — Ambulatory Visit (INDEPENDENT_AMBULATORY_CARE_PROVIDER_SITE_OTHER): Payer: Medicare Other | Admitting: Physician Assistant

## 2020-12-23 ENCOUNTER — Other Ambulatory Visit: Payer: Self-pay

## 2020-12-23 VITALS — BP 128/70 | HR 86 | Ht 75.0 in | Wt 210.2 lb

## 2020-12-23 DIAGNOSIS — Z950 Presence of cardiac pacemaker: Secondary | ICD-10-CM | POA: Diagnosis not present

## 2020-12-23 DIAGNOSIS — I442 Atrioventricular block, complete: Secondary | ICD-10-CM

## 2020-12-23 DIAGNOSIS — I421 Obstructive hypertrophic cardiomyopathy: Secondary | ICD-10-CM

## 2020-12-23 DIAGNOSIS — E782 Mixed hyperlipidemia: Secondary | ICD-10-CM | POA: Diagnosis not present

## 2020-12-23 NOTE — Progress Notes (Signed)
Cardiology Office Note:    Date:  12/23/2020   ID:  Michael Barton, DOB [birth year], MRN [id number]  PCP:  Gaynelle Arabian, MD  [facility] Cardiologist:  Mertie Moores, MD  [facility] Electrophysiologist:  Cristopher Peru, MD   Chief Complaint: Hospital follow up   History of Present Illness:    Michael Barton is a 75 y.o. male with a hx of HOCM s/p alcohol septal ablation at [facility] 07/2017, CHB s/p PPM and hyperlipidemia seen for follow-up.  history of HOCM followed by Dr. Acie Fredrickson. He was evaluated in June 2017 with a normal Myoview study and normal CPX. Echo at that time showed LVH with mild dynamic outflow obstruction. In June 2018 he presented with progressive dyspnea and near syncope on beta blocker. He underwent cardiac cath which showed minimal nonobstructive CAD, LVOT gradient of 55 mm Hg with post PVC gradient of 120 mm Hg. Normal right heart pressures. He was referred to [facility] where Echo showed severe LVH with severe asymmetric septal hypertrophy and significant LVOT gradient. He underwent alcohol septal ablation 6 days ago. The procedure was uncomplicated. Post procedure Ecg showed a new RBBB. Post procedure Echo showed akinesis of the basal septum.   Admitted 07/2017 for syncope. EMS was called and he was initially in an agonal rhythm. CPR was initiated and he regained pulse and blood pressure. He was transported to [facility] where he had recurrent intermittent complete heart block and was taken urgently for temp-pacemaker placement. Discussions were had with Dr Mina Marble at [facility] who felt that he needed BB therapy long term for HOCM. The patient underwent implantation of a MDT dual chamber PPM.  Admitted December 2021 for stroke like symptoms with dysarthria.  Stroke work-up was unremarkable.  Increase pravastatin from 40 to 80 mg.  Did not felt symptoms related TIA given stable blood pressure.  His heart rate was in 60s.  There was concern for orthostatic hypotension and his Toprol XL  reduced to 50 mg daily and added hydrochlorothiazide 12.5 mg daily.  Here for hospital follow-up after medication adjustment.  No recurrent episode of strokelike symptoms.  Patient does not remember if he was orthostatic in the hospital or not.  He denies dizziness upon standing.  No chest pain, shortness of breath, orthopnea, PND, lower extremity edema or melena.  Compliant with his medication.  Past Medical History:  Diagnosis Date  . Depression   . HOCM (hypertrophic obstructive cardiomyopathy) ([place]) 06/24/2016   cMRI 3/18: Severe asymm septal hypertrophy, EF 74, SAM, mod MR, turb flow sugg of significant LVOT gradient, picture c/w HOCM // Echo 6/17: Mild conc LVH, LVOT gradient 14, EF 65-70, mild MR // CP stress test 6/17: NL functional capacity compared to matched sedentary norms. Pt primarily limited by body habitus and deconditioning // Echo 9/12: EF 69.8, LVOT gradient 16   . Hyperlipidemia   . Sleep apnea   . Testicular cancer ([place]) 1990   h/o orchiectomy    Past Surgical History:  Procedure Laterality Date  . ORCHIECTOMY    . PACEMAKER IMPLANT N/A 07/26/2017   Procedure: Pacemaker Implant;  Surgeon: Evans Lance, MD;  Location: [place] [facility];  Service: Cardiovascular;  Laterality: N/A;  . RIGHT/LEFT HEART CATH AND CORONARY ANGIOGRAPHY N/A 04/10/2017   Procedure: Right/Left Heart Cath and Coronary Angiography;  Surgeon: Nelva Bush, MD;  Location: [place] [facility];  Service: Cardiovascular;  Laterality: N/A;  . TEMPORARY PACEMAKER N/A 07/25/2017   Procedure: Temporary Pacemaker;  Surgeon: Sherren Mocha,  MD;  Location: [place] [facility];  Service: Cardiovascular;  Laterality: N/A;    Current Medications: Current Meds  Medication Sig  . aspirin EC 81 MG tablet Take 1 tablet (81 mg total) by mouth daily.  Marland Kitchen buPROPion (WELLBUTRIN SR) 100 MG 12 hr tablet Take 100 mg by mouth 2 (two) times daily.  . hydrochlorothiazide (HYDRODIURIL) 12.5 MG tablet Take 1 tablet (12.5 mg  total) by mouth daily.  Marland Kitchen ibuprofen (ADVIL,MOTRIN) 200 MG tablet Take 200 mg by mouth 2 (two) times a day.  . lamoTRIgine (LAMICTAL) 200 MG tablet Take 200 mg by mouth daily.  . metoprolol succinate (TOPROL-XL) 50 MG 24 hr tablet Take 1 tablet (50 mg total) by mouth daily. Take with or immediately following a meal.  . pravastatin (PRAVACHOL) 80 MG tablet Take 1 tablet (80 mg total) by mouth daily.  . QUEtiapine (SEROQUEL) 200 MG tablet Take 200 mg by mouth at bedtime.  . Testosterone 1.62 % GEL Apply 40.5 mg topically in the morning.  . venlafaxine XR (EFFEXOR-XR) 150 MG 24 hr capsule Take 150 mg by mouth daily with breakfast.     Allergies:   Ambien [zolpidem tartrate] and Prozac [fluoxetine]   Social History   Socioeconomic History  . Marital status: Single    Spouse name: Not on file  . Number of children: Not on file  . Years of education: Not on file  . Highest education level: Not on file  Occupational History  . Occupation: Lobbyist  Tobacco Use  . Smoking status: Never Smoker  . Smokeless tobacco: Never Used  Vaping Use  . Vaping Use: Never used  Substance and Sexual Activity  . Alcohol use: Yes    Comment: minimal, maybe 1 a week  . Drug use: No  . Sexual activity: Not on file  Other Topics Concern  . Not on file  Social History Narrative   ** Merged History Encounter **       Social Determinants of Health   Financial Resource Strain: Not on file  Food Insecurity: Not on file  Transportation Needs: Not on file  Physical Activity: Not on file  Stress: Not on file  Social Connections: Not on file     Family History: The patient's family history includes Cancer in his father; Depression in his mother; Thyroid disease in his mother. There is no history of Stroke.    ROS:   Please see the history of present illness.    All other systems reviewed and are negative.   EKGs/Labs/Other Studies Reviewed:    The following studies were reviewed  today:  Echo 10/2020 1. Left ventricular ejection fraction, by estimation, is 65 to 70%. The  left ventricle has normal function. The left ventricle has no regional  wall motion abnormalities. There is moderate asymmetric left ventricular  hypertrophy of the basal-septal  segment. Left ventricular diastolic parameters are consistent with Grade I  diastolic dysfunction (impaired relaxation).  2. Right ventricular systolic function is normal. The right ventricular  size is normal. There is normal pulmonary artery systolic pressure. The  estimated right ventricular systolic pressure is 41.6 mmHg.  3. The mitral valve is normal in structure. Trivial mitral valve  regurgitation. No evidence of mitral stenosis.  4. The aortic valve is tricuspid. Aortic valve regurgitation is not  visualized. No aortic stenosis is present.  5. The inferior vena cava is normal in size with greater than 50%  respiratory variability, suggesting right atrial pressure of 3 mmHg.  Right/Left Heart Cath and Coronary Angiography 04/2017    Conclusion  Conclusions: 1. Mild, non-obstructive coronary artery disease, with 30% ostial RCA stenosis. 2. Significant dynamic LVOT gradient (10-15 mmHg at rest), which increased to 50-55 mmHg with Valsalva and 120 mmHg post-PVC. 3. Upper normal right heart filling pressure. 4. Normal PCWP and mildly elevated LVEDP. 5. Normal pulmonary artery pressure. 6. Normal Fick cardiac output/index.  Recommendations: 1. Medical therapy and risk factor modification for non-obstructive CAD. 2. Aggressive medical therapy for hypertrophic cardiomyopathy with dynamic LVOT obstruction. If symptoms persist despite optimal medical therapy, septal reduction procedure should be considered.    EKG:  EKG is not  ordered today.   Recent Labs: 11/04/2020: ALT 20; BUN 22; Creatinine, Ser 1.50; Hemoglobin 13.6; Platelets 219; Potassium 3.8; Sodium 133  Recent Lipid Panel    Component Value  Date/Time   CHOL 147 11/05/2020 0404   TRIG 112 11/05/2020 0404   HDL 45 11/05/2020 0404   CHOLHDL 3.3 11/05/2020 0404   VLDL 22 11/05/2020 0404   LDLCALC 80 11/05/2020 0404    Physical Exam:    VS:  BP 128/70   Pulse 86   Ht 6\' 3"  (1.905 m)   Wt 210 lb 3.2 oz (95.3 kg)   BMI 26.27 kg/m     Wt Readings from Last 3 Encounters:  12/23/20 210 lb 3.2 oz (95.3 kg)  11/04/20 210 lb (95.3 kg)  04/08/20 205 lb (93 kg)     GEN: Well nourished, well developed in no acute distress HEENT: Normal NECK: No JVD; No carotid bruits LYMPHATICS: No lymphadenopathy CARDIAC: RRR, no murmurs, rubs, gallops RESPIRATORY:  Clear to auscultation without rales, wheezing or rhonchi  ABDOMEN: Soft, non-tender, non-distended MUSCULOSKELETAL:  No edema; No deformity  SKIN: Warm and dry NEUROLOGIC:  Alert and oriented x 3 PSYCHIATRIC:  Normal affect   ASSESSMENT AND PLAN:   1. HOCM s/p alcohol septal ablation at [facility] 07/2017 -Continue beta-blocker.  Very active at baseline.  Continue exercise regimen  2. CHB s/p PPM -Followed by Dr. Lovena Le  3. HLD -Continue Pravachol 80 mg daily  4. History of strokelike symptoms No clear etiology.  There was concern for orthostatic and his Toprol-XL reduced and added hydrochlorothiazide.  No recurrent episodes since then.  Continue current medical regimen.  Medication Adjustments/Labs and Tests Ordered: Current medicines are reviewed at length with the patient today.  Concerns regarding medicines are outlined above.  No orders of the defined types were placed in this encounter.  No orders of the defined types were placed in this encounter.   Patient Instructions  Medication Instructions:  Your physician recommends that you continue on your current medications as directed. Please refer to the Current Medication list given to you today.  *If you need a refill on your cardiac medications before your next appointment, please call your pharmacy*   Lab  Work: None ordered  If you have labs (blood work) drawn today and your tests are completely normal, you will receive your results only by: Marland Kitchen MyChart Message (if you have MyChart) OR . A paper copy in the mail If you have any lab test that is abnormal or we need to change your treatment, we will call you to review the results.   Testing/Procedures: None ordered   Follow-Up: At [facility], you and your health needs are our priority.  As part of our continuing mission to provide you with exceptional heart care, we have created designated Provider Care Teams.  These Care  Teams include your primary Cardiologist (physician) and Advanced Practice Providers (APPs -  Physician Assistants and Nurse Practitioners) who all work together to provide you with the care you need, when you need it.  We recommend signing up for the patient portal called "MyChart".  Sign up information is provided on this After Visit Summary.  MyChart is used to connect with patients for Virtual Visits (Telemedicine).  Patients are able to view lab/test results, encounter notes, upcoming appointments, etc.  Non-urgent messages can be sent to your provider as well.   To learn more about what you can do with MyChart, go to [email or web address].    Your next appointment:   6 month(s)  The format for your next appointment:   In Person  Provider:   You may see Mertie Moores, MD or one of the following Advanced Practice Providers on your designated Care Team:    Richardson Dopp, PA-C  Robbie Lis, PA-C    Other Instructions      Signed, Leanor Kail, [place]  12/23/2020 4:12 PM    [place]

## 2020-12-23 NOTE — Patient Instructions (Addendum)

## 2021-01-29 DIAGNOSIS — F322 Major depressive disorder, single episode, severe without psychotic features: Secondary | ICD-10-CM | POA: Diagnosis not present

## 2021-01-29 DIAGNOSIS — N183 Chronic kidney disease, stage 3 unspecified: Secondary | ICD-10-CM | POA: Diagnosis not present

## 2021-01-29 DIAGNOSIS — E7800 Pure hypercholesterolemia, unspecified: Secondary | ICD-10-CM | POA: Diagnosis not present

## 2021-02-05 ENCOUNTER — Ambulatory Visit (INDEPENDENT_AMBULATORY_CARE_PROVIDER_SITE_OTHER): Payer: Medicare Other

## 2021-02-05 DIAGNOSIS — I442 Atrioventricular block, complete: Secondary | ICD-10-CM

## 2021-02-05 LAB — CUP PACEART REMOTE DEVICE CHECK
Battery Remaining Longevity: 123 mo
Battery Voltage: 3.02 V
Brady Statistic AP VP Percent: 0.15 %
Brady Statistic AP VS Percent: 38.03 %
Brady Statistic AS VP Percent: 0.09 %
Brady Statistic AS VS Percent: 61.73 %
Brady Statistic RA Percent Paced: 38.29 %
Brady Statistic RV Percent Paced: 0.24 %
Date Time Interrogation Session: 20220331224914
Implantable Lead Implant Date: 20180919
Implantable Lead Implant Date: 20180919
Implantable Lead Location: 753859
Implantable Lead Location: 753860
Implantable Lead Model: 5076
Implantable Lead Model: 5076
Implantable Pulse Generator Implant Date: 20180919
Lead Channel Impedance Value: 304 Ohm
Lead Channel Impedance Value: 342 Ohm
Lead Channel Impedance Value: 380 Ohm
Lead Channel Impedance Value: 418 Ohm
Lead Channel Pacing Threshold Amplitude: 0.625 V
Lead Channel Pacing Threshold Amplitude: 1.5 V
Lead Channel Pacing Threshold Pulse Width: 0.4 ms
Lead Channel Pacing Threshold Pulse Width: 0.4 ms
Lead Channel Sensing Intrinsic Amplitude: 3.25 mV
Lead Channel Sensing Intrinsic Amplitude: 3.25 mV
Lead Channel Sensing Intrinsic Amplitude: 3.5 mV
Lead Channel Sensing Intrinsic Amplitude: 3.5 mV
Lead Channel Setting Pacing Amplitude: 2 V
Lead Channel Setting Pacing Amplitude: 2.5 V
Lead Channel Setting Pacing Pulse Width: 0.8 ms
Lead Channel Setting Sensing Sensitivity: 1.2 mV

## 2021-02-17 NOTE — Progress Notes (Signed)
Remote pacemaker transmission.   

## 2021-03-18 DIAGNOSIS — F322 Major depressive disorder, single episode, severe without psychotic features: Secondary | ICD-10-CM | POA: Diagnosis not present

## 2021-03-18 DIAGNOSIS — E7800 Pure hypercholesterolemia, unspecified: Secondary | ICD-10-CM | POA: Diagnosis not present

## 2021-03-18 DIAGNOSIS — N183 Chronic kidney disease, stage 3 unspecified: Secondary | ICD-10-CM | POA: Diagnosis not present

## 2021-05-04 DIAGNOSIS — L309 Dermatitis, unspecified: Secondary | ICD-10-CM | POA: Diagnosis not present

## 2021-05-04 DIAGNOSIS — L821 Other seborrheic keratosis: Secondary | ICD-10-CM | POA: Diagnosis not present

## 2021-05-04 DIAGNOSIS — M67479 Ganglion, unspecified ankle and foot: Secondary | ICD-10-CM | POA: Diagnosis not present

## 2021-05-07 ENCOUNTER — Ambulatory Visit (INDEPENDENT_AMBULATORY_CARE_PROVIDER_SITE_OTHER): Payer: Medicare Other

## 2021-05-07 DIAGNOSIS — I469 Cardiac arrest, cause unspecified: Secondary | ICD-10-CM

## 2021-05-10 LAB — CUP PACEART REMOTE DEVICE CHECK
Battery Remaining Longevity: 120 mo
Battery Voltage: 3.01 V
Brady Statistic AP VP Percent: 0.4 %
Brady Statistic AP VS Percent: 67.21 %
Brady Statistic AS VP Percent: 0.06 %
Brady Statistic AS VS Percent: 32.34 %
Brady Statistic RA Percent Paced: 67.7 %
Brady Statistic RV Percent Paced: 0.46 %
Date Time Interrogation Session: 20220701044106
Implantable Lead Implant Date: 20180919
Implantable Lead Implant Date: 20180919
Implantable Lead Location: 753859
Implantable Lead Location: 753860
Implantable Lead Model: 5076
Implantable Lead Model: 5076
Implantable Pulse Generator Implant Date: 20180919
Lead Channel Impedance Value: 304 Ohm
Lead Channel Impedance Value: 361 Ohm
Lead Channel Impedance Value: 380 Ohm
Lead Channel Impedance Value: 456 Ohm
Lead Channel Pacing Threshold Amplitude: 0.625 V
Lead Channel Pacing Threshold Amplitude: 1.375 V
Lead Channel Pacing Threshold Pulse Width: 0.4 ms
Lead Channel Pacing Threshold Pulse Width: 0.4 ms
Lead Channel Sensing Intrinsic Amplitude: 3.5 mV
Lead Channel Sensing Intrinsic Amplitude: 3.5 mV
Lead Channel Sensing Intrinsic Amplitude: 4.125 mV
Lead Channel Sensing Intrinsic Amplitude: 4.125 mV
Lead Channel Setting Pacing Amplitude: 2 V
Lead Channel Setting Pacing Amplitude: 2.5 V
Lead Channel Setting Pacing Pulse Width: 0.8 ms
Lead Channel Setting Sensing Sensitivity: 1.2 mV

## 2021-05-19 DIAGNOSIS — Z79899 Other long term (current) drug therapy: Secondary | ICD-10-CM | POA: Diagnosis not present

## 2021-05-19 DIAGNOSIS — F319 Bipolar disorder, unspecified: Secondary | ICD-10-CM | POA: Diagnosis not present

## 2021-05-28 NOTE — Progress Notes (Signed)
Remote pacemaker transmission.   

## 2021-06-22 DIAGNOSIS — M67479 Ganglion, unspecified ankle and foot: Secondary | ICD-10-CM | POA: Diagnosis not present

## 2021-07-03 ENCOUNTER — Other Ambulatory Visit: Payer: Self-pay | Admitting: Cardiovascular Disease

## 2021-07-04 ENCOUNTER — Encounter: Payer: Self-pay | Admitting: Cardiovascular Disease

## 2021-07-04 NOTE — Progress Notes (Signed)
Cardiology Office Note   Date:  07/05/2021   ID:  [name], DOB 05-28-1946, MRN [id number]  PCP:  Gaynelle Arabian, MD  Cardiologist:   Mertie Moores, MD   Chief Complaint  Patient presents with   Cardiomyopathy   1. Shortness of breath 2. Hyperlipidemia 3. Obstructive sleep apnea 4. Depression    History of Present Illness: Michael Barton is a 75 y.o. male who presents for DOE Was an avid runner and cyclist until 5 years ago. Is not able to run or cycle recently - has been a steady decline  He can now run for 1 minute and then walk for 2-3   He has noticed a definint decline in his abiltiy - now gets short of breath doing everyday activities. He become short of breath climbing stairs and walking from his office out to the car.  He denies any chest pain. He has no shortness of breath at rest. He's had an echo card gram the past which reveals left ventricular hypertrophy.  He work at an Database administrator firm    Apr 01, 2016:  Nilay was seen last month with episodes of shortness of breath with exertion. A stress Myoview study showed no ischemia. His ejection fraction was 58%.  Kainan has not improved.   Has not had his echo yet.   Aug. 18, 2017: Michael Barton is seen for his dyspnea with exertion .    Myoview Mar 28, 2016 - no ischemia.  EF 58% Echo - June 2017:   Normal LV dysfunction .   Mild dynamic LV obstruction   Cardiopulmonary stress test:    Overall normal function capacity  He has been started on Coreg - has been increased to 6.25 BID   Can walk and cycle witout problems Has DOE with running    Nov. 27, 2017:  Michael Barton Is seen back for follow-up office visit. He had an echocardiogram which reveals a  Slight dynamic obstruction with LVH.   We increased his Coreg to 6.25 BID , And he has not noticed any significant improvement in his exertional dyspnea.  Feb. 26,  2018  Michael Barton is slightly worse. Has some symptoms or orthostasis.  The change from  Coreg to Metoprolol did not help  Still hiking - especially in the warmer months   March 01, 2017:   Michael Barton is seen back today  He had stopped the metoprolol prior to getting the MRI.  Still cant walk very far without getting severe shortness of breath .    Sept. 27, 2018:  Michael Barton is seen today for follow up  He has had an alcohol septal ablation at [facility] for his HOCM.  He had an episode of syncope the week after the ablation  And was found to have  complete AV block    He had a pacer placed, he did well but had a brief episode of syncope and was re-hospitalized .   Was found to have orthostasis .   Is feeling much better.   No further episodes of syncope  Is here for wound check and pacer check and to see me for follow up.    September 27, 2017  Michael Barton is doing well.  No CP or dyspnea.  No further episdoes of syncope since getting the pacer   Apr 02, 2020:  Michael Barton is seen today for follow up of his HOCM.  He is s/p alcohol ablation ([facility]) and pacer placement. His visit last year was by telemedicine.  Still is very active.  He hikes on a regular basis.  He is put in a garden ( squash, peppers, tomatoes, okra, zuchinni) .  He works out on the treadmill on a regular basis.  Has lost 6 lbs.    Aug. 29, 2022: Michael Barton is seen for follow up of his HOCM - s/p ablation at [facility]. 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  Has OSA  Has a pacer, Wants to investigate - Inspire.for OSA  Want to know if his pacer is compatable  I would also have concerns if the Inspire is MRI compatable     Past Medical History:  Diagnosis Date   Depression    HOCM (hypertrophic obstructive cardiomyopathy) ([place]) 06/24/2016   cMRI 3/18: Severe asymm septal hypertrophy, EF 74, SAM, mod MR, turb flow sugg of significant LVOT gradient, picture c/w HOCM // Echo 6/17: Mild conc LVH, LVOT gradient 14, EF 65-70, mild MR // CP stress test 6/17: NL functional capacity compared  to matched sedentary norms. Pt primarily limited by body habitus and deconditioning // Echo 9/12: EF 69.8, LVOT gradient 16    Hyperlipidemia    Sleep apnea    Testicular cancer ([place]) 1990   h/o orchiectomy    Past Surgical History:  Procedure Laterality Date   ORCHIECTOMY     PACEMAKER IMPLANT N/A 07/26/2017   Procedure: Pacemaker Implant;  Surgeon: Evans Lance, MD;  Location: [place] [facility];  Service: Cardiovascular;  Laterality: N/A;   RIGHT/LEFT HEART CATH AND CORONARY ANGIOGRAPHY N/A 04/10/2017   Procedure: Right/Left Heart Cath and Coronary Angiography;  Surgeon: Nelva Bush, MD;  Location: [place] [facility];  Service: Cardiovascular;  Laterality: N/A;   TEMPORARY PACEMAKER N/A 07/25/2017   Procedure: Temporary Pacemaker;  Surgeon: Sherren Mocha, MD;  Location: [place] [facility];  Service: Cardiovascular;  Laterality: N/A;     Current Outpatient Medications  Medication Sig Dispense Refill   aspirin EC 81 MG tablet Take 1 tablet (81 mg total) by mouth daily.     buPROPion (WELLBUTRIN SR) 100 MG 12 hr tablet Take 100 mg by mouth 2 (two) times daily.     ibuprofen (ADVIL,MOTRIN) 200 MG tablet Take 200 mg by mouth 2 (two) times a day.     lamoTRIgine (LAMICTAL) 200 MG tablet Take 200 mg by mouth daily.     metoprolol succinate (TOPROL-XL) 100 MG 24 hr tablet Take 100 mg by mouth daily. Take with or immediately following a meal.     pravastatin (PRAVACHOL) 80 MG tablet Take 1 tablet (80 mg total) by mouth daily. 30 tablet 1   QUEtiapine (SEROQUEL) 200 MG tablet Take 200 mg by mouth at bedtime.     Testosterone 1.62 % GEL Apply 40.5 mg topically in the morning.     venlafaxine XR (EFFEXOR-XR) 150 MG 24 hr capsule Take 150 mg by mouth daily with breakfast.     hydrochlorothiazide (HYDRODIURIL) 12.5 MG tablet Take 1 tablet (12.5 mg total) by mouth daily. (Patient not taking: Reported on 07/05/2021) 30 tablet 1   metoprolol succinate (TOPROL-XL) 50 MG 24 hr tablet Take 1  tablet (50 mg total) by mouth daily. Take with or immediately following a meal. (Patient not taking: Reported on 07/05/2021) 30 tablet 1   No current facility-administered medications for this visit.    Allergies:   Ambien [zolpidem tartrate] and Prozac [fluoxetine]    Social History:  The patient  reports  that he has never smoked. He has never used smokeless tobacco. He reports current alcohol use. He reports that he does not use drugs.   Family History:  The patient's family history includes Cancer in his father; Depression in his mother; Thyroid disease in his mother.    ROS:  Please see the history of present illness.     All other systems are reviewed and negative.    Physical Exam: Blood pressure 140/80, pulse 98, height '6\' 3"'$  (1.905 m), weight 208 lb 12.8 oz (94.7 kg), SpO2 93 %.  GEN:  Well nourished, well developed in no acute distress HEENT: Normal NECK: No JVD; No carotid bruits LYMPHATICS: No lymphadenopathy CARDIAC: RRR , no murmurs, rubs, gallops RESPIRATORY:  Clear to auscultation without rales, wheezing or rhonchi  ABDOMEN: Soft, non-tender, non-distended MUSCULOSKELETAL:  No edema; No deformity  SKIN: Warm and dry NEUROLOGIC:  Alert and oriented x 3   EKG:   July 05, 2021: Normal sinus rhythm at 98.  Right bundle branch block.   Recent Labs: 11/04/2020: ALT 20; BUN 22; Creatinine, Ser 1.50; Hemoglobin 13.6; Platelets 219; Potassium 3.8; Sodium 133    Lipid Panel    Component Value Date/Time   CHOL 147 11/05/2020 0404   TRIG 112 11/05/2020 0404   HDL 45 11/05/2020 0404   CHOLHDL 3.3 11/05/2020 0404   VLDL 22 11/05/2020 0404   LDLCALC 80 11/05/2020 0404      Wt Readings from Last 3 Encounters:  07/05/21 208 lb 12.8 oz (94.7 kg)  12/23/20 210 lb 3.2 oz (95.3 kg)  11/04/20 210 lb (95.3 kg)      Other studies Reviewed: Additional studies/ records that were reviewed today include: . Review of the above records demonstrates:    ASSESSMENT AND  PLAN:  1.  Dynamic left ventricular outflow tract obstruction -no significant murmur heard today.  He status post alcohol ablation.   He seems to be very stable. No symptoms    2. Complete heart block: - following his alcohol ablation for HOCM.  Has a pacer  3.   Orthostatic hypotension :     4.  Sleep apnea:   he wants to look into the [facility] implant.   While he is doing well, there is a chance that he may need to have another cardiac MRI and I suggested that he make sure he could get an MRI with the INspire We reviewed the data online The Inspire 3028 is compatible with MRI at 1.5 T   Current medicines are reviewed at length with the patient today.  The patient does not have concerns regarding medicines.  The following changes have been made:  no change  Labs/ tests ordered today include:   No orders of the defined types were placed in this encounter.   Mertie Moores, MD  07/05/2021 3:42 PM    [facility] [address], [place], [place]  [address] Phone: [phone]; Fax: [phone]

## 2021-07-05 ENCOUNTER — Ambulatory Visit (INDEPENDENT_AMBULATORY_CARE_PROVIDER_SITE_OTHER): Payer: Medicare Other | Admitting: Cardiovascular Disease

## 2021-07-05 ENCOUNTER — Encounter: Payer: Self-pay | Admitting: Cardiovascular Disease

## 2021-07-05 ENCOUNTER — Other Ambulatory Visit: Payer: Self-pay

## 2021-07-05 VITALS — BP 140/80 | HR 98 | Ht 75.0 in | Wt 208.8 lb

## 2021-07-05 DIAGNOSIS — Z950 Presence of cardiac pacemaker: Secondary | ICD-10-CM

## 2021-07-05 DIAGNOSIS — I442 Atrioventricular block, complete: Secondary | ICD-10-CM

## 2021-07-05 DIAGNOSIS — I421 Obstructive hypertrophic cardiomyopathy: Secondary | ICD-10-CM

## 2021-07-05 DIAGNOSIS — E782 Mixed hyperlipidemia: Secondary | ICD-10-CM | POA: Diagnosis not present

## 2021-07-05 MED ORDER — METOPROLOL SUCCINATE ER 100 MG PO TB24: 100.0000 mg | ORAL_TABLET | Freq: Every day | ORAL | 3 refills | Status: DC

## 2021-07-05 NOTE — Patient Instructions (Signed)

## 2021-07-07 DIAGNOSIS — E7800 Pure hypercholesterolemia, unspecified: Secondary | ICD-10-CM | POA: Diagnosis not present

## 2021-07-07 DIAGNOSIS — N183 Chronic kidney disease, stage 3 unspecified: Secondary | ICD-10-CM | POA: Diagnosis not present

## 2021-07-07 DIAGNOSIS — F322 Major depressive disorder, single episode, severe without psychotic features: Secondary | ICD-10-CM | POA: Diagnosis not present

## 2021-07-26 DIAGNOSIS — E291 Testicular hypofunction: Secondary | ICD-10-CM | POA: Diagnosis not present

## 2021-07-30 DIAGNOSIS — E291 Testicular hypofunction: Secondary | ICD-10-CM | POA: Diagnosis not present

## 2021-08-03 DIAGNOSIS — M67479 Ganglion, unspecified ankle and foot: Secondary | ICD-10-CM | POA: Diagnosis not present

## 2021-08-06 ENCOUNTER — Ambulatory Visit (INDEPENDENT_AMBULATORY_CARE_PROVIDER_SITE_OTHER): Payer: Medicare Other

## 2021-08-06 DIAGNOSIS — I442 Atrioventricular block, complete: Secondary | ICD-10-CM | POA: Diagnosis not present

## 2021-08-06 LAB — CUP PACEART REMOTE DEVICE CHECK
Battery Remaining Longevity: 116 mo
Battery Voltage: 3.01 V
Brady Statistic AP VP Percent: 0.6 %
Brady Statistic AP VS Percent: 63.29 %
Brady Statistic AS VP Percent: 0.07 %
Brady Statistic AS VS Percent: 36.03 %
Brady Statistic RA Percent Paced: 64.08 %
Brady Statistic RV Percent Paced: 0.68 %
Date Time Interrogation Session: 20220929224737
Implantable Lead Implant Date: 20180919
Implantable Lead Implant Date: 20180919
Implantable Lead Location: 753859
Implantable Lead Location: 753860
Implantable Lead Model: 5076
Implantable Lead Model: 5076
Implantable Pulse Generator Implant Date: 20180919
Lead Channel Impedance Value: 304 Ohm
Lead Channel Impedance Value: 342 Ohm
Lead Channel Impedance Value: 361 Ohm
Lead Channel Impedance Value: 399 Ohm
Lead Channel Pacing Threshold Amplitude: 0.5 V
Lead Channel Pacing Threshold Amplitude: 1.75 V
Lead Channel Pacing Threshold Pulse Width: 0.4 ms
Lead Channel Pacing Threshold Pulse Width: 0.4 ms
Lead Channel Sensing Intrinsic Amplitude: 3.625 mV
Lead Channel Sensing Intrinsic Amplitude: 3.625 mV
Lead Channel Sensing Intrinsic Amplitude: 4 mV
Lead Channel Sensing Intrinsic Amplitude: 4 mV
Lead Channel Setting Pacing Amplitude: 2 V
Lead Channel Setting Pacing Amplitude: 2.5 V
Lead Channel Setting Pacing Pulse Width: 0.8 ms
Lead Channel Setting Sensing Sensitivity: 1.2 mV

## 2021-08-12 NOTE — Progress Notes (Signed)
Remote pacemaker transmission.   

## 2021-08-24 ENCOUNTER — Other Ambulatory Visit: Payer: Self-pay

## 2021-08-24 ENCOUNTER — Ambulatory Visit (INDEPENDENT_AMBULATORY_CARE_PROVIDER_SITE_OTHER): Payer: Medicare Other | Admitting: Internal Medicine

## 2021-08-24 VITALS — BP 134/70 | HR 87 | Ht 75.0 in | Wt 214.0 lb

## 2021-08-24 DIAGNOSIS — I442 Atrioventricular block, complete: Secondary | ICD-10-CM

## 2021-08-24 DIAGNOSIS — Z950 Presence of cardiac pacemaker: Secondary | ICD-10-CM

## 2021-08-24 DIAGNOSIS — I421 Obstructive hypertrophic cardiomyopathy: Secondary | ICD-10-CM | POA: Diagnosis not present

## 2021-08-24 NOTE — Progress Notes (Signed)
HPI Michael Barton returns today for followup. He is a pleasant 75 yo man with a h/o HCM, s/p ETOH septal ablation complicated by CHB. He underwent PPM insertion. In the interim, he has started back exercising and will walk for up to an hour at rates of over 4 mph. He denies chest pain or sob. No syncope. No edema.  Allergies  Allergen Reactions   Ambien [Zolpidem Tartrate] Other (See Comments)    GERD   Prozac [Fluoxetine] Other (See Comments)    "makes me feel weird"     Current Outpatient Medications  Medication Sig Dispense Refill   aspirin EC 81 MG tablet Take 1 tablet (81 mg total) by mouth daily.     buPROPion (WELLBUTRIN SR) 100 MG 12 hr tablet Take 100 mg by mouth 2 (two) times daily.     hydrochlorothiazide (HYDRODIURIL) 12.5 MG tablet Take 1 tablet (12.5 mg total) by mouth daily. 30 tablet 1   ibuprofen (ADVIL,MOTRIN) 200 MG tablet Take 200 mg by mouth 2 (two) times a day.     lamoTRIgine (LAMICTAL) 200 MG tablet Take 200 mg by mouth daily.     metoprolol succinate (TOPROL-XL) 100 MG 24 hr tablet Take 1 tablet (100 mg total) by mouth daily. Take with or immediately following a meal. 90 tablet 3   pravastatin (PRAVACHOL) 80 MG tablet Take 1 tablet (80 mg total) by mouth daily. 30 tablet 1   QUEtiapine (SEROQUEL) 200 MG tablet Take 200 mg by mouth at bedtime.     Testosterone 1.62 % GEL Apply 40.5 mg topically in the morning.     venlafaxine XR (EFFEXOR-XR) 150 MG 24 hr capsule Take 150 mg by mouth daily with breakfast.     No current facility-administered medications for this visit.     Past Medical History:  Diagnosis Date   Depression    HOCM (hypertrophic obstructive cardiomyopathy) ([place]) 06/24/2016   cMRI 3/18: Severe asymm septal hypertrophy, EF 74, SAM, mod MR, turb flow sugg of significant LVOT gradient, picture c/w HOCM // Echo 6/17: Mild conc LVH, LVOT gradient 14, EF 65-70, mild MR // CP stress test 6/17: NL functional capacity compared to matched sedentary  norms. Pt primarily limited by body habitus and deconditioning // Echo 9/12: EF 69.8, LVOT gradient 16    Hyperlipidemia    Sleep apnea    Testicular cancer ([place]) 1990   h/o orchiectomy    ROS:   All systems reviewed and negative except as noted in the HPI.   Past Surgical History:  Procedure Laterality Date   ORCHIECTOMY     PACEMAKER IMPLANT N/A 07/26/2017   Procedure: Pacemaker Implant;  Surgeon: Evans Lance, MD;  Location: [place] [facility];  Service: Cardiovascular;  Laterality: N/A;   RIGHT/LEFT HEART CATH AND CORONARY ANGIOGRAPHY N/A 04/10/2017   Procedure: Right/Left Heart Cath and Coronary Angiography;  Surgeon: Nelva Bush, MD;  Location: [place] [facility];  Service: Cardiovascular;  Laterality: N/A;   TEMPORARY PACEMAKER N/A 07/25/2017   Procedure: Temporary Pacemaker;  Surgeon: Sherren Mocha, MD;  Location: [place] [facility];  Service: Cardiovascular;  Laterality: N/A;     Family History  Problem Relation Age of Onset   Thyroid disease Mother    Depression Mother    Cancer Father    Stroke Neg Hx      Social History   Socioeconomic History   Marital status: Single    Spouse name: Not on file   Number  of children: Not on file   Years of education: Not on file   Highest education level: Not on file  Occupational History   Occupation: Lobbyist  Tobacco Use   Smoking status: Never   Smokeless tobacco: Never  Vaping Use   Vaping Use: Never used  Substance and Sexual Activity   Alcohol use: Yes    Comment: minimal, maybe 1 a week   Drug use: No   Sexual activity: Not on file  Other Topics Concern   Not on file  Social History Narrative   ** Merged History Encounter **       Social Determinants of Health   Financial Resource Strain: Not on file  Food Insecurity: Not on file  Transportation Needs: Not on file  Physical Activity: Not on file  Stress: Not on file  Social Connections: Not on file  Intimate Partner Violence: Not  on file     BP 134/70   Pulse 87   Ht 6\' 3"  (1.905 m)   Wt 214 lb (97.1 kg)   SpO2 97%   BMI 26.75 kg/m   Physical Exam:  Well appearing NAD HEENT: Unremarkable Neck:  No JVD, no thyromegally Lymphatics:  No adenopathy Back:  No CVA tenderness Lungs:  Clear HEART:  Regular rate rhythm, no murmurs, no rubs, no clicks Abd:  soft, positive bowel sounds, no organomegally, no rebound, no guarding Ext:  2 plus pulses, no edema, no cyanosis, no clubbing Skin:  No rashes no nodules Neuro:  CN II through XII intact, motor grossly intact  DEVICE  Normal device function.  See PaceArt for details.   Assess/Plan:  1. CHB - he is pacing minimally in the ventricle. He will continue his current meds. 2. PPM - his medtronic DDD PPM is working normally.  3. HCM - he is s/p ETOH septal ablation and is asymptomatic. No change. 4. HTN - his bp is well controlled. No change in meds. I have encouraged him to increase his activity.   Cristopher Peru, M.D

## 2021-08-24 NOTE — Patient Instructions (Signed)
Medication Instructions:  Your physician recommends that you continue on your current medications as directed. Please refer to the Current Medication list given to you today.  Labwork: None ordered.  Testing/Procedures: None ordered.  Follow-Up: Your physician wants you to follow-up in: one year with Cristopher Peru, MD or one of the following Advanced Practice Providers on your designated Care Team:   Tommye Standard, [place] Legrand Como "Jonni Sanger" Chalmers Cater, [place]  Remote monitoring is used to monitor your Pacemaker from home. This monitoring reduces the number of office visits required to check your device to one time per year. It allows [place] to keep an eye on the functioning of your device to ensure it is working properly. You are scheduled for a device check from home on 11/05/2021. You may send your transmission at any time that day. If you have a wireless device, the transmission will be sent automatically. After your physician reviews your transmission, you will receive a postcard with your next transmission date.  Any Other Special Instructions Will Be Listed Below (If Applicable).  If you need a refill on your cardiac medications before your next appointment, please call your pharmacy.

## 2021-09-01 DIAGNOSIS — I700 Atherosclerosis of aorta: Secondary | ICD-10-CM | POA: Diagnosis not present

## 2021-09-01 DIAGNOSIS — I495 Sick sinus syndrome: Secondary | ICD-10-CM | POA: Diagnosis not present

## 2021-09-01 DIAGNOSIS — Z0000 Encounter for general adult medical examination without abnormal findings: Secondary | ICD-10-CM | POA: Diagnosis not present

## 2021-09-01 DIAGNOSIS — Z950 Presence of cardiac pacemaker: Secondary | ICD-10-CM | POA: Diagnosis not present

## 2021-09-01 DIAGNOSIS — Z23 Encounter for immunization: Secondary | ICD-10-CM | POA: Diagnosis not present

## 2021-09-01 DIAGNOSIS — I421 Obstructive hypertrophic cardiomyopathy: Secondary | ICD-10-CM | POA: Diagnosis not present

## 2021-09-01 DIAGNOSIS — E291 Testicular hypofunction: Secondary | ICD-10-CM | POA: Diagnosis not present

## 2021-09-01 DIAGNOSIS — G4733 Obstructive sleep apnea (adult) (pediatric): Secondary | ICD-10-CM | POA: Diagnosis not present

## 2021-09-01 DIAGNOSIS — N183 Chronic kidney disease, stage 3 unspecified: Secondary | ICD-10-CM | POA: Diagnosis not present

## 2021-09-01 DIAGNOSIS — Z1389 Encounter for screening for other disorder: Secondary | ICD-10-CM | POA: Diagnosis not present

## 2021-09-01 DIAGNOSIS — E7800 Pure hypercholesterolemia, unspecified: Secondary | ICD-10-CM | POA: Diagnosis not present

## 2021-09-01 DIAGNOSIS — F322 Major depressive disorder, single episode, severe without psychotic features: Secondary | ICD-10-CM | POA: Diagnosis not present

## 2021-11-05 ENCOUNTER — Ambulatory Visit (INDEPENDENT_AMBULATORY_CARE_PROVIDER_SITE_OTHER): Payer: Medicare Other

## 2021-11-05 DIAGNOSIS — I442 Atrioventricular block, complete: Secondary | ICD-10-CM

## 2021-11-07 LAB — CUP PACEART REMOTE DEVICE CHECK
Battery Remaining Longevity: 116 mo
Battery Voltage: 3.01 V
Brady Statistic AP VP Percent: 0.8 %
Brady Statistic AP VS Percent: 67.77 %
Brady Statistic AS VP Percent: 0.09 %
Brady Statistic AS VS Percent: 31.34 %
Brady Statistic RA Percent Paced: 68.71 %
Brady Statistic RV Percent Paced: 0.89 %
Date Time Interrogation Session: 20221230151115
Implantable Lead Implant Date: 20180919
Implantable Lead Implant Date: 20180919
Implantable Lead Location: 753859
Implantable Lead Location: 753860
Implantable Lead Model: 5076
Implantable Lead Model: 5076
Implantable Pulse Generator Implant Date: 20180919
Lead Channel Impedance Value: 304 Ohm
Lead Channel Impedance Value: 342 Ohm
Lead Channel Impedance Value: 399 Ohm
Lead Channel Impedance Value: 456 Ohm
Lead Channel Pacing Threshold Amplitude: 0.625 V
Lead Channel Pacing Threshold Amplitude: 1.375 V
Lead Channel Pacing Threshold Pulse Width: 0.4 ms
Lead Channel Pacing Threshold Pulse Width: 0.4 ms
Lead Channel Sensing Intrinsic Amplitude: 3.25 mV
Lead Channel Sensing Intrinsic Amplitude: 3.25 mV
Lead Channel Sensing Intrinsic Amplitude: 4.5 mV
Lead Channel Sensing Intrinsic Amplitude: 4.5 mV
Lead Channel Setting Pacing Amplitude: 2 V
Lead Channel Setting Pacing Amplitude: 2.5 V
Lead Channel Setting Pacing Pulse Width: 0.8 ms
Lead Channel Setting Sensing Sensitivity: 1.2 mV

## 2021-11-17 NOTE — Progress Notes (Signed)
Remote pacemaker transmission.   

## 2021-11-26 DIAGNOSIS — F3170 Bipolar disorder, currently in remission, most recent episode unspecified: Secondary | ICD-10-CM | POA: Diagnosis not present

## 2021-11-26 DIAGNOSIS — E7800 Pure hypercholesterolemia, unspecified: Secondary | ICD-10-CM | POA: Diagnosis not present

## 2021-12-01 DIAGNOSIS — F3174 Bipolar disorder, in full remission, most recent episode manic: Secondary | ICD-10-CM | POA: Diagnosis not present

## 2021-12-01 DIAGNOSIS — Z79899 Other long term (current) drug therapy: Secondary | ICD-10-CM | POA: Diagnosis not present

## 2021-12-02 DIAGNOSIS — F322 Major depressive disorder, single episode, severe without psychotic features: Secondary | ICD-10-CM | POA: Diagnosis not present

## 2021-12-02 DIAGNOSIS — E7800 Pure hypercholesterolemia, unspecified: Secondary | ICD-10-CM | POA: Diagnosis not present

## 2021-12-02 DIAGNOSIS — N183 Chronic kidney disease, stage 3 unspecified: Secondary | ICD-10-CM | POA: Diagnosis not present

## 2022-01-04 DIAGNOSIS — H5319 Other subjective visual disturbances: Secondary | ICD-10-CM | POA: Diagnosis not present

## 2022-01-26 DIAGNOSIS — R0982 Postnasal drip: Secondary | ICD-10-CM | POA: Diagnosis not present

## 2022-01-26 DIAGNOSIS — G4733 Obstructive sleep apnea (adult) (pediatric): Secondary | ICD-10-CM | POA: Diagnosis not present

## 2022-01-26 DIAGNOSIS — Z6826 Body mass index (BMI) 26.0-26.9, adult: Secondary | ICD-10-CM | POA: Diagnosis not present

## 2022-02-01 DIAGNOSIS — R0982 Postnasal drip: Secondary | ICD-10-CM | POA: Diagnosis not present

## 2022-02-01 DIAGNOSIS — J342 Deviated nasal septum: Secondary | ICD-10-CM | POA: Diagnosis not present

## 2022-03-04 DIAGNOSIS — G4730 Sleep apnea, unspecified: Secondary | ICD-10-CM | POA: Diagnosis not present

## 2022-03-29 ENCOUNTER — Ambulatory Visit (INDEPENDENT_AMBULATORY_CARE_PROVIDER_SITE_OTHER): Payer: Medicare Other

## 2022-03-29 DIAGNOSIS — I442 Atrioventricular block, complete: Secondary | ICD-10-CM | POA: Diagnosis not present

## 2022-03-29 LAB — CUP PACEART REMOTE DEVICE CHECK
Battery Remaining Longevity: 109 mo
Battery Voltage: 3.01 V
Brady Statistic AP VP Percent: 0.89 %
Brady Statistic AP VS Percent: 67.36 %
Brady Statistic AS VP Percent: 0.09 %
Brady Statistic AS VS Percent: 31.66 %
Brady Statistic RA Percent Paced: 68.4 %
Brady Statistic RV Percent Paced: 0.98 %
Date Time Interrogation Session: 20230521101026
Implantable Lead Implant Date: 20180919
Implantable Lead Implant Date: 20180919
Implantable Lead Location: 753859
Implantable Lead Location: 753860
Implantable Lead Model: 5076
Implantable Lead Model: 5076
Implantable Pulse Generator Implant Date: 20180919
Lead Channel Impedance Value: 304 Ohm
Lead Channel Impedance Value: 342 Ohm
Lead Channel Impedance Value: 399 Ohm
Lead Channel Impedance Value: 399 Ohm
Lead Channel Pacing Threshold Amplitude: 0.625 V
Lead Channel Pacing Threshold Amplitude: 1.625 V
Lead Channel Pacing Threshold Pulse Width: 0.4 ms
Lead Channel Pacing Threshold Pulse Width: 0.4 ms
Lead Channel Sensing Intrinsic Amplitude: 3.5 mV
Lead Channel Sensing Intrinsic Amplitude: 3.5 mV
Lead Channel Sensing Intrinsic Amplitude: 4.125 mV
Lead Channel Sensing Intrinsic Amplitude: 4.125 mV
Lead Channel Setting Pacing Amplitude: 2 V
Lead Channel Setting Pacing Amplitude: 2.5 V
Lead Channel Setting Pacing Pulse Width: 0.8 ms
Lead Channel Setting Sensing Sensitivity: 1.2 mV

## 2022-04-13 ENCOUNTER — Other Ambulatory Visit: Payer: Self-pay | Admitting: Otolaryngology

## 2022-04-13 NOTE — Progress Notes (Signed)
Remote pacemaker transmission.   

## 2022-04-18 DIAGNOSIS — I421 Obstructive hypertrophic cardiomyopathy: Secondary | ICD-10-CM | POA: Diagnosis not present

## 2022-04-18 DIAGNOSIS — F322 Major depressive disorder, single episode, severe without psychotic features: Secondary | ICD-10-CM | POA: Diagnosis not present

## 2022-04-18 DIAGNOSIS — N183 Chronic kidney disease, stage 3 unspecified: Secondary | ICD-10-CM | POA: Diagnosis not present

## 2022-04-18 DIAGNOSIS — E7800 Pure hypercholesterolemia, unspecified: Secondary | ICD-10-CM | POA: Diagnosis not present

## 2022-05-02 ENCOUNTER — Encounter (HOSPITAL_BASED_OUTPATIENT_CLINIC_OR_DEPARTMENT_OTHER): Payer: Self-pay | Admitting: Otolaryngology

## 2022-05-14 DIAGNOSIS — M25522 Pain in left elbow: Secondary | ICD-10-CM | POA: Diagnosis not present

## 2022-05-14 DIAGNOSIS — L03114 Cellulitis of left upper limb: Secondary | ICD-10-CM | POA: Diagnosis not present

## 2022-05-17 ENCOUNTER — Ambulatory Visit (HOSPITAL_BASED_OUTPATIENT_CLINIC_OR_DEPARTMENT_OTHER): Payer: Medicare Other | Admitting: Certified Registered"

## 2022-05-17 ENCOUNTER — Encounter (HOSPITAL_BASED_OUTPATIENT_CLINIC_OR_DEPARTMENT_OTHER)
Admission: RE | Disposition: A | Discharge status: Home / Self Care | Payer: Self-pay | Source: Home / Self Care | Attending: Otolaryngology

## 2022-05-17 ENCOUNTER — Encounter (HOSPITAL_BASED_OUTPATIENT_CLINIC_OR_DEPARTMENT_OTHER): Payer: Self-pay | Admitting: Otolaryngology

## 2022-05-17 ENCOUNTER — Other Ambulatory Visit: Payer: Self-pay

## 2022-05-17 ENCOUNTER — Ambulatory Visit (HOSPITAL_BASED_OUTPATIENT_CLINIC_OR_DEPARTMENT_OTHER)
Admission: RE | Admit: 2022-05-17 | Discharge: 2022-05-17 | Disposition: A | Discharge status: Home / Self Care | Payer: Medicare Other | Attending: Otolaryngology | Admitting: Otolaryngology

## 2022-05-17 DIAGNOSIS — G4733 Obstructive sleep apnea (adult) (pediatric): Secondary | ICD-10-CM

## 2022-05-17 DIAGNOSIS — Z950 Presence of cardiac pacemaker: Secondary | ICD-10-CM | POA: Insufficient documentation

## 2022-05-17 DIAGNOSIS — N183 Chronic kidney disease, stage 3 unspecified: Secondary | ICD-10-CM | POA: Diagnosis not present

## 2022-05-17 DIAGNOSIS — M25422 Effusion, left elbow: Secondary | ICD-10-CM | POA: Diagnosis not present

## 2022-05-17 DIAGNOSIS — I509 Heart failure, unspecified: Secondary | ICD-10-CM | POA: Insufficient documentation

## 2022-05-17 DIAGNOSIS — I5033 Acute on chronic diastolic (congestive) heart failure: Secondary | ICD-10-CM | POA: Diagnosis not present

## 2022-05-17 DIAGNOSIS — I110 Hypertensive heart disease with heart failure: Secondary | ICD-10-CM | POA: Diagnosis not present

## 2022-05-17 DIAGNOSIS — F319 Bipolar disorder, unspecified: Secondary | ICD-10-CM | POA: Insufficient documentation

## 2022-05-17 DIAGNOSIS — Z79899 Other long term (current) drug therapy: Secondary | ICD-10-CM | POA: Diagnosis not present

## 2022-05-17 DIAGNOSIS — I130 Hypertensive heart and chronic kidney disease with heart failure and stage 1 through stage 4 chronic kidney disease, or unspecified chronic kidney disease: Secondary | ICD-10-CM | POA: Diagnosis not present

## 2022-05-17 DIAGNOSIS — M7022 Olecranon bursitis, left elbow: Secondary | ICD-10-CM | POA: Diagnosis not present

## 2022-05-17 HISTORY — PX: DRUG INDUCED ENDOSCOPY: SHX6808

## 2022-05-17 SURGERY — DRUG INDUCED SLEEP ENDOSCOPY
Anesthesia: General | Site: Nose | Laterality: Right

## 2022-05-17 MED ORDER — ACETAMINOPHEN 325 MG PO TABS: 325.0000 mg | ORAL_TABLET | ORAL | Status: DC | PRN

## 2022-05-17 MED ORDER — ACETAMINOPHEN 160 MG/5ML PO SOLN: 325.0000 mg | ORAL | Status: DC | PRN

## 2022-05-17 MED ORDER — PROPOFOL 500 MG/50ML IV EMUL
INTRAVENOUS | Status: DC | PRN
  Administered 2022-05-17: 25 ug/kg/min via INTRAVENOUS

## 2022-05-17 MED ORDER — OXYMETAZOLINE HCL 0.05 % NA SOLN
NASAL | Status: DC | PRN
  Administered 2022-05-17: 1 via TOPICAL

## 2022-05-17 MED ORDER — PROPOFOL 10 MG/ML IV BOLUS
INTRAVENOUS | Status: DC | PRN
  Administered 2022-05-17: 20 mg via INTRAVENOUS

## 2022-05-17 MED ORDER — ONDANSETRON HCL 4 MG/2ML IJ SOLN: 4.0000 mg | Freq: Once | INTRAMUSCULAR | Status: DC | PRN

## 2022-05-17 MED ORDER — LIDOCAINE HCL (CARDIAC) PF 100 MG/5ML IV SOSY
PREFILLED_SYRINGE | INTRAVENOUS | Status: DC | PRN
  Administered 2022-05-17: 30 mg via INTRAVENOUS

## 2022-05-17 MED ORDER — LACTATED RINGERS IV SOLN: INTRAVENOUS | Status: DC

## 2022-05-17 MED ORDER — OXYMETAZOLINE HCL 0.05 % NA SOLN
NASAL | Status: AC
  Filled 2022-05-17: qty 30

## 2022-05-17 MED ORDER — ONDANSETRON HCL 4 MG/2ML IJ SOLN
INTRAMUSCULAR | Status: DC | PRN
  Administered 2022-05-17: 4 mg via INTRAVENOUS

## 2022-05-17 SURGICAL SUPPLY — 16 items
CANISTER SUCT 1200ML W/VALVE (MISCELLANEOUS) ×2 IMPLANT
GLOVE BIO SURGEON STRL SZ7.5 (GLOVE) ×2 IMPLANT
GLOVE BIOGEL PI IND STRL 7.0 (GLOVE) IMPLANT
GLOVE BIOGEL PI IND STRL 7.5 (GLOVE) IMPLANT
GLOVE BIOGEL PI INDICATOR 7.0 (GLOVE) ×1
GLOVE BIOGEL PI INDICATOR 7.5 (GLOVE) ×1
KIT CLEAN ENDO (MISCELLANEOUS) ×2 IMPLANT
NDL HYPO 27GX1-1/4 (NEEDLE) IMPLANT
NEEDLE HYPO 27GX1-1/4 (NEEDLE) IMPLANT
PATTIES SURGICAL .5 X3 (DISPOSABLE) ×2 IMPLANT
SHEET MEDIUM DRAPE 40X70 STRL (DRAPES) ×2 IMPLANT
SOL ANTI FOG 6CC (MISCELLANEOUS) ×1 IMPLANT
SOLUTION ANTI FOG 6CC (MISCELLANEOUS) ×1
SYR CONTROL 10ML LL (SYRINGE) IMPLANT
TOWEL GREEN STERILE FF (TOWEL DISPOSABLE) ×2 IMPLANT
TUBE CONNECTING 20X1/4 (TUBING) ×2 IMPLANT

## 2022-05-17 NOTE — H&P (Signed)
Michael Barton is an 76 y.o. male.   Chief Complaint: Sleep apnea HPI: 76 year old male with obstructive sleep apnea who is not tolerating CPAP.  Past Medical History:  Diagnosis Date   Depression    HOCM (hypertrophic obstructive cardiomyopathy) ([place]) 06/24/2016   cMRI 3/18: Severe asymm septal hypertrophy, EF 74, SAM, mod MR, turb flow sugg of significant LVOT gradient, picture c/w HOCM // Echo 6/17: Mild conc LVH, LVOT gradient 14, EF 65-70, mild MR // CP stress test 6/17: NL functional capacity compared to matched sedentary norms. Pt primarily limited by body habitus and deconditioning // Echo 9/12: EF 69.8, LVOT gradient 16    Hyperlipidemia    Sleep apnea    Testicular cancer ([place]) 1990   h/o orchiectomy    Past Surgical History:  Procedure Laterality Date   ORCHIECTOMY     PACEMAKER IMPLANT N/A 07/26/2017   Procedure: Pacemaker Implant;  Surgeon: Evans Lance, MD;  Location: [place] [facility];  Service: Cardiovascular;  Laterality: N/A;   RIGHT/LEFT HEART CATH AND CORONARY ANGIOGRAPHY N/A 04/10/2017   Procedure: Right/Left Heart Cath and Coronary Angiography;  Surgeon: Nelva Bush, MD;  Location: [place] [facility];  Service: Cardiovascular;  Laterality: N/A;   TEMPORARY PACEMAKER N/A 07/25/2017   Procedure: Temporary Pacemaker;  Surgeon: Sherren Mocha, MD;  Location: [place] [facility];  Service: Cardiovascular;  Laterality: N/A;    Family History  Problem Relation Age of Onset   Thyroid disease Mother    Depression Mother    Cancer Father    Stroke Neg Hx    Social History:  reports that he has never smoked. He has never used smokeless tobacco. He reports current alcohol use. He reports that he does not use drugs.  Allergies:  Allergies  Allergen Reactions   Ambien [Zolpidem Tartrate] Other (See Comments)    GERD   Prozac [Fluoxetine] Other (See Comments)    "makes me feel weird"    Medications Prior to Admission  Medication Sig Dispense Refill    aspirin EC 81 MG tablet Take 1 tablet (81 mg total) by mouth daily.     buPROPion (WELLBUTRIN SR) 100 MG 12 hr tablet Take 100 mg by mouth 2 (two) times daily.     ibuprofen (ADVIL,MOTRIN) 200 MG tablet Take 200 mg by mouth 2 (two) times a day.     lamoTRIgine (LAMICTAL) 200 MG tablet Take 200 mg by mouth daily.     metoprolol succinate (TOPROL-XL) 100 MG 24 hr tablet Take 1 tablet (100 mg total) by mouth daily. Take with or immediately following a meal. 90 tablet 3   pravastatin (PRAVACHOL) 80 MG tablet Take 1 tablet (80 mg total) by mouth daily. 30 tablet 1   QUEtiapine (SEROQUEL) 200 MG tablet Take 200 mg by mouth at bedtime.     venlafaxine XR (EFFEXOR-XR) 150 MG 24 hr capsule Take 150 mg by mouth daily with breakfast.     hydrochlorothiazide (HYDRODIURIL) 12.5 MG tablet Take 1 tablet (12.5 mg total) by mouth daily. 30 tablet 1   Testosterone 1.62 % GEL Apply 40.5 mg topically in the morning.      No results found for this or any previous visit (from the past 48 hour(s)). No results found.  Review of Systems  Musculoskeletal:  Positive for arthralgias and joint swelling.  All other systems reviewed and are negative.   Blood pressure 137/86, pulse 79, temperature 98.2 F (36.8 C), temperature source Oral, height '6\' 3"'$  (1.905  m), weight 94.4 kg, SpO2 100 %. Physical Exam Constitutional:      Appearance: Normal appearance. He is obese.  HENT:     Head: Normocephalic and atraumatic.     Right Ear: External ear normal.     Left Ear: External ear normal.     Nose: Nose normal.     Mouth/Throat:     Mouth: Mucous membranes are moist.     Pharynx: Oropharynx is clear.  Eyes:     Extraocular Movements: Extraocular movements intact.     Conjunctiva/sclera: Conjunctivae normal.     Pupils: Pupils are equal, round, and reactive to light.  Cardiovascular:     Rate and Rhythm: Normal rate.  Pulmonary:     Effort: Pulmonary effort is normal.  Musculoskeletal:        General: Normal  range of motion.     Cervical back: Normal range of motion.  Skin:    General: Skin is warm and dry.  Neurological:     General: No focal deficit present.     Mental Status: He is alert and oriented to person, place, and time.  Psychiatric:        Mood and Affect: Mood normal.        Behavior: Behavior normal.        Thought Content: Thought content normal.        Judgment: Judgment normal.      Assessment/Plan Obstructive sleep apnea  To OR for sleep endoscopy.  Left elbow swelling likely represents bursa abscess.  Advised he call his PCP today about being seen.  Melida Quitter, MD 05/17/2022, 12:04 PM

## 2022-05-17 NOTE — Anesthesia Procedure Notes (Signed)
Procedure Name: MAC Date/Time: 05/17/2022 12:31 PM  Performed by: Signe Colt, CRNAPre-anesthesia Checklist: Patient identified, Emergency Drugs available, Suction available, Patient being monitored and Timeout performed Patient Re-evaluated:Patient Re-evaluated prior to induction Oxygen Delivery Method: Simple face mask

## 2022-05-17 NOTE — Brief Op Note (Signed)
05/17/2022  12:26 PM  PATIENT:  Michael Barton  76 y.o. male  PRE-OPERATIVE DIAGNOSIS:  Obstructive sleep apnea syndrome  POST-OPERATIVE DIAGNOSIS:  Obstructive sleep apnea syndrome  PROCEDURE:  Procedure(s): DRUG INDUCED ENDOSCOPY (Right)  SURGEON:  Surgeon(s) and Role:    Melida Quitter, MD - Primary  PHYSICIAN ASSISTANT:   ASSISTANTS: none   ANESTHESIA:   IV sedation  EBL:  None   BLOOD ADMINISTERED:none  DRAINS: none   LOCAL MEDICATIONS USED:  NONE  SPECIMEN:  No Specimen  DISPOSITION OF SPECIMEN:  N/A  COUNTS:  YES  TOURNIQUET:  * No tourniquets in log *  DICTATION: .Note written in EPIC  PLAN OF CARE: Discharge to home after PACU  PATIENT DISPOSITION:  PACU - hemodynamically stable.   Delay start of Pharmacological VTE agent (>24hrs) due to surgical blood loss or risk of bleeding: no

## 2022-05-17 NOTE — Discharge Instructions (Signed)

## 2022-05-17 NOTE — Transfer of Care (Signed)
Immediate Anesthesia Transfer of Care Note  Patient: Michael Barton  Procedure(s) Performed: DRUG INDUCED ENDOSCOPY (Right: Nose)  Patient Location: PACU  Anesthesia Type:MAC  Level of Consciousness: awake, alert , oriented and patient cooperative  Airway & Oxygen Therapy: Patient Spontanous Breathing and Patient connected to face mask oxygen  Post-op Assessment: Report given to RN and Post -op Vital signs reviewed and stable  Post vital signs: Reviewed and stable  Last Vitals:  Vitals Value Taken Time  BP    Temp    Pulse 70 05/17/22 1229  Resp 18 05/17/22 1229  SpO2 99 % 05/17/22 1229  Vitals shown include unvalidated device data.  Last Pain:  Vitals:   05/17/22 1124  TempSrc: Oral  PainSc: 4       Patients Stated Pain Goal: 3 (75/91/63 8466)  Complications: No notable events documented.

## 2022-05-17 NOTE — Op Note (Signed)
Preop diagnosis: Obstructive sleep apnea Postop diagnosis: same Procedure: Drug-induced sleep endoscopy Surgeon: Redmond Baseman Anesth: IV sedation Compl: None Findings: There is 50% anterior-posterior and 50% lateral wall collapse at the velum making him a candidate for hypoglossal nerve stimulator placement.  There was also significant collapse at the tongue base from all sides. Description:  After discussing risks, benefits, and alternatives, the patient was brought to the operative suite and placed on the operative table in the supine position.  Anesthesia was induced and the patient was given light sedation to simulate natural sleep. When the proper level was reached, an Afrin-soaked pledget was placed in the right nasal passage for a couple of minutes and then removed.  The fiberoptic laryngoscope was then passed to view the pharynx and larynx.  Findings are noted above and the exam was recorded.  After completion, the scope was removed and the patient was returned to anesthesia for wakeup and was moved to the recovery room in stable condition.

## 2022-05-17 NOTE — Anesthesia Preprocedure Evaluation (Addendum)
Anesthesia Evaluation  Patient identified by MRN, date of birth, ID band Patient awake    Reviewed: Allergy & Precautions, NPO status , Patient's Chart, lab work & pertinent test results, reviewed documented beta blocker date and time   Airway Mallampati: IV       Dental no notable dental hx. (+) Teeth Intact   Pulmonary sleep apnea ,    Pulmonary exam normal        Cardiovascular hypertension, Pt. on home beta blockers and Pt. on medications +CHF  Normal cardiovascular exam+ pacemaker + Valvular Problems/Murmurs MR  + Systolic murmurs    Neuro/Psych PSYCHIATRIC DISORDERS Depression Bipolar Disorder negative neurological ROS     GI/Hepatic negative GI ROS, Neg liver ROS,   Endo/Other  negative endocrine ROS  Renal/GU Renal diseaseCKD III  negative genitourinary   Musculoskeletal negative musculoskeletal ROS (+)   Abdominal Normal abdominal exam  (+)   Peds  Hematology   Anesthesia Other Findings Scheduled remote reviewed. Normal device function.   1 AMS, 27 secs   IMPRESSIONS    1. Left ventricular ejection fraction, by estimation, is 65 to 70%. The  left ventricle has normal function. The left ventricle has no regional  wall motion abnormalities. There is moderate asymmetric left ventricular  hypertrophy of the basal-septal  segment. Left ventricular diastolic parameters are consistent with Grade I  diastolic dysfunction (impaired relaxation).  2. Right ventricular systolic function is normal. The right ventricular  size is normal. There is normal pulmonary artery systolic pressure. The  estimated right ventricular systolic pressure is 79.0 mmHg.  3. The mitral valve is normal in structure. Trivial mitral valve  regurgitation. No evidence of mitral stenosis.  4. The aortic valve is tricuspid. Aortic valve regurgitation is not  visualized. No aortic stenosis is present.  5. The inferior vena cava is  normal in size with greater than 50%  respiratory variability, suggesting right atrial pressure of 3 mmHg.   FINDINGS  Reproductive/Obstetrics                            Anesthesia Physical Anesthesia Plan  ASA: 3  Anesthesia Plan: General   Post-op Pain Management: Minimal or no pain anticipated   Induction: Intravenous  PONV Risk Score and Plan: 2 and Propofol infusion and TIVA  Airway Management Planned: Natural Airway and Nasal Cannula  Additional Equipment: None  Intra-op Plan:   Post-operative Plan:   Informed Consent: I have reviewed the patients History and Physical, chart, labs and discussed the procedure including the risks, benefits and alternatives for the proposed anesthesia with the patient or authorized representative who has indicated his/her understanding and acceptance.       Plan Discussed with: CRNA  Anesthesia Plan Comments:         Anesthesia Quick Evaluation

## 2022-05-17 NOTE — Anesthesia Postprocedure Evaluation (Signed)
Anesthesia Post Note  Patient: Michael Barton  Procedure(s) Performed: DRUG INDUCED ENDOSCOPY (Right: Nose)     Patient location during evaluation: Phase II Anesthesia Type: General Level of consciousness: awake Pain management: pain level controlled Vital Signs Assessment: post-procedure vital signs reviewed and stable Respiratory status: spontaneous breathing Cardiovascular status: stable Postop Assessment: no apparent nausea or vomiting Anesthetic complications: no   No notable events documented.  Last Vitals:  Vitals:   05/17/22 1230 05/17/22 1245  BP: (!) 150/72 (!) 157/79  Pulse: 67 68  Resp: 14 20  Temp:    SpO2: 100% 93%    Last Pain:  Vitals:   05/17/22 1229  TempSrc:   PainSc: 0-No pain                 Huston Foley

## 2022-05-18 ENCOUNTER — Encounter (HOSPITAL_BASED_OUTPATIENT_CLINIC_OR_DEPARTMENT_OTHER): Payer: Self-pay | Admitting: Otolaryngology

## 2022-05-20 DIAGNOSIS — M5430 Sciatica, unspecified side: Secondary | ICD-10-CM | POA: Diagnosis not present

## 2022-05-20 DIAGNOSIS — M7022 Olecranon bursitis, left elbow: Secondary | ICD-10-CM | POA: Diagnosis not present

## 2022-06-07 ENCOUNTER — Other Ambulatory Visit: Payer: Self-pay | Admitting: Otolaryngology

## 2022-06-09 DIAGNOSIS — M7021 Olecranon bursitis, right elbow: Secondary | ICD-10-CM | POA: Diagnosis not present

## 2022-06-09 DIAGNOSIS — M7022 Olecranon bursitis, left elbow: Secondary | ICD-10-CM | POA: Diagnosis not present

## 2022-06-28 ENCOUNTER — Ambulatory Visit (INDEPENDENT_AMBULATORY_CARE_PROVIDER_SITE_OTHER): Payer: Medicare Other

## 2022-06-28 DIAGNOSIS — I442 Atrioventricular block, complete: Secondary | ICD-10-CM | POA: Diagnosis not present

## 2022-06-28 LAB — CUP PACEART REMOTE DEVICE CHECK
Battery Remaining Longevity: 108 mo
Battery Voltage: 3.01 V
Brady Statistic AP VP Percent: 0.19 %
Brady Statistic AP VS Percent: 46.88 %
Brady Statistic AS VP Percent: 0.1 %
Brady Statistic AS VS Percent: 52.84 %
Brady Statistic RA Percent Paced: 47.44 %
Brady Statistic RV Percent Paced: 0.28 %
Date Time Interrogation Session: 20230821233500
Implantable Lead Implant Date: 20180919
Implantable Lead Implant Date: 20180919
Implantable Lead Location: 753859
Implantable Lead Location: 753860
Implantable Lead Model: 5076
Implantable Lead Model: 5076
Implantable Pulse Generator Implant Date: 20180919
Lead Channel Impedance Value: 304 Ohm
Lead Channel Impedance Value: 342 Ohm
Lead Channel Impedance Value: 399 Ohm
Lead Channel Impedance Value: 437 Ohm
Lead Channel Pacing Threshold Amplitude: 0.625 V
Lead Channel Pacing Threshold Amplitude: 1.5 V
Lead Channel Pacing Threshold Pulse Width: 0.4 ms
Lead Channel Pacing Threshold Pulse Width: 0.4 ms
Lead Channel Sensing Intrinsic Amplitude: 2.5 mV
Lead Channel Sensing Intrinsic Amplitude: 2.5 mV
Lead Channel Sensing Intrinsic Amplitude: 3.25 mV
Lead Channel Sensing Intrinsic Amplitude: 3.25 mV
Lead Channel Setting Pacing Amplitude: 2 V
Lead Channel Setting Pacing Amplitude: 2.5 V
Lead Channel Setting Pacing Pulse Width: 0.8 ms
Lead Channel Setting Sensing Sensitivity: 1.2 mV

## 2022-07-13 ENCOUNTER — Encounter: Payer: Self-pay | Admitting: Cardiovascular Disease

## 2022-07-13 ENCOUNTER — Ambulatory Visit: Payer: Medicare Other | Attending: Cardiovascular Disease | Admitting: Cardiovascular Disease

## 2022-07-13 ENCOUNTER — Encounter: Payer: Self-pay | Admitting: Internal Medicine

## 2022-07-13 VITALS — BP 128/86 | HR 78 | Ht 75.0 in | Wt 207.0 lb

## 2022-07-13 DIAGNOSIS — I421 Obstructive hypertrophic cardiomyopathy: Secondary | ICD-10-CM | POA: Insufficient documentation

## 2022-07-13 DIAGNOSIS — R0609 Other forms of dyspnea: Secondary | ICD-10-CM | POA: Insufficient documentation

## 2022-07-13 NOTE — Patient Instructions (Signed)
Medication Instructions:  Your physician recommends that you continue on your current medications as directed. Please refer to the Current Medication list given to you today.  *If you need a refill on your cardiac medications before your next appointment, please call your pharmacy*   Lab Work: NONE If you have labs (blood work) drawn today and your tests are completely normal, you will receive your results only by: [place] (if you have MyChart) OR A paper copy in the mail If you have any lab test that is abnormal or we need to change your treatment, we will call you to review the results.   Testing/Procedures: ECHO Your physician has requested that you have an echocardiogram. Echocardiography is a painless test that uses sound waves to create images of your heart. It provides your doctor with information about the size and shape of your heart and how well your heart's chambers and valves are working. This procedure takes approximately one hour. There are no restrictions for this procedure.  [place] test Your physician has requested that you have a lexiscan myoview. For further information please visit [email or web address]. Please follow instruction sheet, as given.  Follow-Up: At [facility], you and your health needs are our priority.  As part of our continuing mission to provide you with exceptional heart care, we have created designated Provider Care Teams.  These Care Teams include your primary Cardiologist (physician) and Advanced Practice Providers (APPs -  Physician Assistants and Nurse Practitioners) who all work together to provide you with the care you need, when you need it.  Your next appointment:   3 month(s)  The format for your next appointment:   In Person  Provider:   Mertie Moores, MD     Other Instructions See Separate instructions for stress test  Important Information About Sugar

## 2022-07-13 NOTE — Progress Notes (Signed)
[place] DEVICE PROGRAMMING  Patient Information: Name:  Michael Barton  DOB:  October 02, 1946  MRN:  [id number]    Planned Procedure:  Implant Hypoglossal Nerve Stimulator  Surgeon:  Redmond Baseman  Date of Procedure:  07-19-22  Cautery will be used.  Position during surgery:  supine   Please send documentation back to:  [place] (Fax # [phone])  Device Information:  Clinic EP Physician:  Cristopher Peru, MD   Device Type:  Pacemaker Manufacturer and Phone #:  Medtronic: [phone] Pacemaker Dependent?:  Unknown Date of Last Device Check:  06/27/22 Normal Device Function?:  Yes.    Electrophysiologist's Recommendations:  Have magnet available. Provide continuous ECG monitoring when magnet is used or reprogramming is to be performed.  Procedure will likely interfere with device function.  Device should be programmed:  Asynchronous pacing during procedure and returned to normal programming after procedure  Per Device Clinic Standing Orders, Simone Curia, RN  9:58 AM 07/13/2022

## 2022-07-13 NOTE — Progress Notes (Signed)
Cardiology Office Note   Date:  07/13/2022   ID:  Michael Barton, DOB [birth year], MRN [id number]  PCP:  Gaynelle Arabian, MD  Cardiologist:   Mertie Moores, MD   Chief Complaint  Patient presents with   Cardiomyopathy   1. Shortness of breath 2. Hyperlipidemia 3. Obstructive sleep apnea 4. Depression    History of Present Illness: Michael Barton is a 76 y.o. male who presents for DOE Was an avid runner and cyclist until 5 years ago. Is not able to run or cycle recently - has been a steady decline  He can now run for 1 minute and then walk for 2-3   He has noticed a definint decline in his abiltiy - now gets short of breath doing everyday activities. He become short of breath climbing stairs and walking from his office out to the car.  He denies any chest pain. He has no shortness of breath at rest. He's had an echo card gram the past which reveals left ventricular hypertrophy.  He work at an Database administrator firm    Apr 01, 2016:  Iden was seen last month with episodes of shortness of breath with exertion. A stress Myoview study showed no ischemia. His ejection fraction was 58%.  [name] has not improved.   Has not had his echo yet.   Aug. 18, 2017: [name] is seen for his dyspnea with exertion .    Myoview Mar 28, 2016 - no ischemia.  EF 58% Echo - June 2017:   Normal LV dysfunction .   Mild dynamic LV obstruction   Cardiopulmonary stress test:    Overall normal function capacity  He has been started on Coreg - has been increased to 6.25 BID   Can walk and cycle witout problems Has DOE with running    Nov. 27, 2017:  [name] Is seen back for follow-up office visit. He had an echocardiogram which reveals a  Slight dynamic obstruction with LVH.   We increased his Coreg to 6.25 BID , And he has not noticed any significant improvement in his exertional dyspnea.  Feb. 26,  2018  [name] is slightly worse. Has some symptoms or orthostasis.  The change from Coreg  to Metoprolol did not help  Still hiking - especially in the warmer months   March 01, 2017:   [name] is seen back today  He had stopped the metoprolol prior to getting the MRI.  Still cant walk very far without getting severe shortness of breath .    Sept. 27, 2018:  [name] is seen today for follow up  He has had an alcohol septal ablation at [facility] for his HOCM.  He had an episode of syncope the week after the ablation  And was found to have  complete AV block    He had a pacer placed, he did well but had a brief episode of syncope and was re-hospitalized .   Was found to have orthostasis .   Is feeling much better.   No further episodes of syncope  Is here for wound check and pacer check and to see me for follow up.    September 27, 2017  [name] is doing well.  No CP or dyspnea.  No further episdoes of syncope since getting the pacer   Apr 02, 2020:  [name] is seen today for follow up of his HOCM.  He is s/p alcohol ablation ([facility]) and pacer placement. His visit last year was by telemedicine.  Still is very active.  He hikes on a regular basis.  He is put in a garden ( squash, peppers, tomatoes, okra, zuchinni) .  He works out on the treadmill on a regular basis.  Has lost 6 lbs.    Aug. 29, 2022: [name] is seen for follow up of his HOCM - s/p ablation at [facility]. 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  Has OSA  Has a pacer, Wants to investigate - Inspire.for OSA  Want to know if his pacer is compatable  I would also have concerns if the Dawna Part is MRI compatable    Sept. 6, 2023  [name] is seen for follow up of his HOCM  He has been having some shortness of breath with exertion.  He is also noticed that his muscles are very stiff and sore when he wakes up in the morning.  Some days are worse than others   Scheduled for the Inspire  Past Medical History:  Diagnosis Date   Depression    HOCM (hypertrophic obstructive cardiomyopathy)  ([place]) 06/24/2016   cMRI 3/18: Severe asymm septal hypertrophy, EF 74, SAM, mod MR, turb flow sugg of significant LVOT gradient, picture c/w HOCM // Echo 6/17: Mild conc LVH, LVOT gradient 14, EF 65-70, mild MR // CP stress test 6/17: NL functional capacity compared to matched sedentary norms. Pt primarily limited by body habitus and deconditioning // Echo 9/12: EF 69.8, LVOT gradient 16    Hyperlipidemia    Sleep apnea    Testicular cancer ([place]) 1990   h/o orchiectomy    Past Surgical History:  Procedure Laterality Date   DRUG INDUCED ENDOSCOPY Right 05/17/2022   Procedure: DRUG INDUCED ENDOSCOPY;  Surgeon: Melida Quitter, MD;  Location: [facility];  Service: ENT;  Laterality: Right;   ORCHIECTOMY     PACEMAKER IMPLANT N/A 07/26/2017   Procedure: Pacemaker Implant;  Surgeon: Evans Lance, MD;  Location: [facility];  Service: Cardiovascular;  Laterality: N/A;   RIGHT/LEFT HEART CATH AND CORONARY ANGIOGRAPHY N/A 04/10/2017   Procedure: Right/Left Heart Cath and Coronary Angiography;  Surgeon: Nelva Bush, MD;  Location: [facility];  Service: Cardiovascular;  Laterality: N/A;   TEMPORARY PACEMAKER N/A 07/25/2017   Procedure: Temporary Pacemaker;  Surgeon: Sherren Mocha, MD;  Location: [facility];  Service: Cardiovascular;  Laterality: N/A;     Current Outpatient Medications  Medication Sig Dispense Refill   aspirin EC 81 MG tablet Take 1 tablet (81 mg total) by mouth daily.     buPROPion (WELLBUTRIN SR) 100 MG 12 hr tablet Take 100 mg by mouth 2 (two) times daily.     ibuprofen (ADVIL,MOTRIN) 200 MG tablet Take 200 mg by mouth 2 (two) times a day.     lamoTRIgine (LAMICTAL) 200 MG tablet Take 200 mg by mouth daily.     metoprolol succinate (TOPROL-XL) 100 MG 24 hr tablet Take 1 tablet (100 mg total) by mouth daily. Take with or immediately following a meal. 90 tablet 3   pravastatin (PRAVACHOL) 80 MG tablet Take 1 tablet (80 mg total) by mouth  daily. 30 tablet 1   QUEtiapine (SEROQUEL) 200 MG tablet Take 200 mg by mouth at bedtime.     Testosterone 1.62 % GEL Apply 40.5 mg topically in the morning.     venlafaxine XR (EFFEXOR-XR) 150 MG 24 hr capsule Take 150 mg by mouth daily with breakfast.  No current facility-administered medications for this visit.    Allergies:   Ambien [zolpidem tartrate] and Prozac [fluoxetine]    Social History:  The patient  reports that he has never smoked. He has never used smokeless tobacco. He reports current alcohol use. He reports that he does not use drugs.   Family History:  The patient's family history includes Cancer in his father; Depression in his mother; Thyroid disease in his mother.    ROS:  Please see the history of present illness.     All other systems are reviewed and negative.    Physical Exam: Blood pressure 128/86, pulse 78, height '6\' 3"'$  (1.905 m), weight 207 lb (93.9 kg).       GEN:  Well nourished, well developed in no acute distress HEENT: Normal NECK: No JVD; No carotid bruits LYMPHATICS: No lymphadenopathy CARDIAC: RRR , no murmurs, rubs, gallops RESPIRATORY:  Clear to auscultation without rales, wheezing or rhonchi  ABDOMEN: Soft, non-tender, non-distended MUSCULOSKELETAL:  No edema; No deformity  SKIN: Warm and dry NEUROLOGIC:  Alert and oriented x 3    EKG:   July 13, 2022: Atrial paced with ventricular sensing at 78.  Right bundle branch block.  No changes from previous EKG.   Recent Labs: No results found for requested labs within last 365 days.    Lipid Panel    Component Value Date/Time   CHOL 147 11/05/2020 0404   TRIG 112 11/05/2020 0404   HDL 45 11/05/2020 0404   CHOLHDL 3.3 11/05/2020 0404   VLDL 22 11/05/2020 0404   LDLCALC 80 11/05/2020 0404      Wt Readings from Last 3 Encounters:  07/13/22 207 lb (93.9 kg)  05/17/22 208 lb 1.8 oz (94.4 kg)  08/24/21 214 lb (97.1 kg)      Other studies Reviewed: Additional studies/  records that were reviewed today include: . Review of the above records demonstrates:    ASSESSMENT AND PLAN:  1.  Dynamic left ventricular outflow tract obstruction  S/p ETOH ablation.    Will have him follow up with Dr. Gasper Sells upon my retirement   He is having progressive shortness of breath with exertion.  I do not hear a recurrent obstructive murmur.  I would like to get an echocardiogram for further evaluation of his LV function and valvular function.  We will also get a Lexiscan Myoview study to rule out ischemia as a cause for this shortness of breath with exertion.  2. Complete heart block: -  3.   Orthostatic hypotension :     4.  Sleep apnea:      Current medicines are reviewed at length with the patient today.  The patient does not have concerns regarding medicines.  The following changes have been made:  no change  Labs/ tests ordered today include:   Orders Placed This Encounter  Procedures   MYOCARDIAL PERFUSION IMAGING   EKG 12-Lead   ECHOCARDIOGRAM COMPLETE     Mertie Moores, MD  07/13/2022 6:01 PM    [facility] [place], [place], [place]  [address] Phone: [phone]; Fax: [phone]

## 2022-07-18 ENCOUNTER — Telehealth (HOSPITAL_COMMUNITY): Payer: Self-pay

## 2022-07-18 DIAGNOSIS — G4733 Obstructive sleep apnea (adult) (pediatric): Secondary | ICD-10-CM | POA: Diagnosis not present

## 2022-07-18 DIAGNOSIS — Z6826 Body mass index (BMI) 26.0-26.9, adult: Secondary | ICD-10-CM | POA: Diagnosis not present

## 2022-07-18 NOTE — Telephone Encounter (Signed)
Spoke with the patient, detailed instructions given. He stated that he would be here for his test. Asked to call back with any questions. S.Carmaleta Youngers EMTP 

## 2022-07-19 ENCOUNTER — Encounter (HOSPITAL_COMMUNITY): Payer: Self-pay | Admitting: Certified Registered Nurse Anesthetist

## 2022-07-19 ENCOUNTER — Ambulatory Visit (HOSPITAL_COMMUNITY): Payer: Medicare Other | Attending: Cardiovascular Disease

## 2022-07-19 ENCOUNTER — Encounter (HOSPITAL_COMMUNITY): Payer: Self-pay | Admitting: Otolaryngology

## 2022-07-19 ENCOUNTER — Encounter: Payer: Self-pay | Admitting: Internal Medicine

## 2022-07-19 DIAGNOSIS — I421 Obstructive hypertrophic cardiomyopathy: Secondary | ICD-10-CM

## 2022-07-19 DIAGNOSIS — R0609 Other forms of dyspnea: Secondary | ICD-10-CM | POA: Diagnosis not present

## 2022-07-19 LAB — MYOCARDIAL PERFUSION IMAGING
LV dias vol: 74 mL (ref 62–150)
LV sys vol: 36 mL
Nuc Stress EF: 51 %
Peak HR: 64 {beats}/min
Rest HR: 63 {beats}/min
Rest Nuclear Isotope Dose: 10.1 mCi
SDS: 3
SRS: 6
SSS: 9
ST Depression (mm): 0 mm
Stress Nuclear Isotope Dose: 31.8 mCi
TID: 1.06

## 2022-07-19 MED ORDER — TECHNETIUM TC 99M TETROFOSMIN IV KIT
10.1000 | PACK | Freq: Once | INTRAVENOUS | Status: AC | PRN
  Administered 2022-07-19: 10.1 via INTRAVENOUS

## 2022-07-19 MED ORDER — TECHNETIUM TC 99M TETROFOSMIN IV KIT
31.8000 | PACK | Freq: Once | INTRAVENOUS | Status: AC | PRN
  Administered 2022-07-19: 31.8 via INTRAVENOUS

## 2022-07-19 MED ORDER — REGADENOSON 0.4 MG/5ML IV SOLN
0.4000 mg | Freq: Once | INTRAVENOUS | Status: AC
  Administered 2022-07-19: 0.4 mg via INTRAVENOUS

## 2022-07-19 NOTE — Progress Notes (Signed)
[place] DEVICE PROGRAMMING  Patient Information: Name:  Michael Barton  DOB:  07-30-1946  MRN:  [id number]    Planned Procedure:  Implantation of Hypoglossal nerve stimulator  Surgeon:  Dr. Redmond Baseman  Date of Procedure:  07/20/22  Cautery will be used.  Position during surgery:  supine   Please send documentation back to:  [facility] (Fax # [phone])  Device Information:  Clinic EP Physician:  Cristopher Peru, MD   Device Type:  Pacemaker Manufacturer and Phone #:  Medtronic: [phone] Pacemaker Dependent?:  No. Date of Last Device Check:  06/27/2022 Normal Device Function?:  Yes.    Electrophysiologist's Recommendations:  Have magnet available. Provide continuous ECG monitoring when magnet is used or reprogramming is to be performed.  Procedure will likely interfere with device function.  Device should be programmed:  Asynchronous pacing during procedure and returned to normal programming after procedure  Per Device Clinic Standing Orders, Simone Curia, RN  4:15 PM 07/19/2022

## 2022-07-19 NOTE — Progress Notes (Signed)
Anesthesia Chart Review: SAME DAY WORK-UP  Case: [id number] Date/Time: 07/20/22 0931   Procedure: IMPLANTATION OF HYPOGLOSSAL NERVE STIMULATOR (Bilateral)   Anesthesia type: General   Pre-op diagnosis:      Obstructive sleep apnea syndrome     BMI 26.0-26.9,adult   Location: [facility] OR ROOM 07 / [place] OR   Surgeons: Melida Quitter, MD       DISCUSSION: Patient is a 76 year old male scheduled for the above procedure. S/p drug induced sleep endoscopy 05/17/22.  History includes never smoker, HOCM (s/p alcohol septal ablation procedure 07/19/17 at [facility]), PPM (Medtronic 07/26/17), OSA, HLD, testicular cancer (s/p orchiectomy 1990).   Last visit with cardiologist EP Dr. Lovena Le was on 08/24/21. Medtronic DDD PPM working normally. He was pacing minimally in the ventricle. Continue current medications.   EP perioperative PPM recommendations: Device Information: Clinic EP Physician:  Cristopher Peru, MD  Device Type:  Pacemaker Manufacturer and Phone #:  Medtronic: [phone] Pacemaker Dependent?:  Unknown Date of Last Device Check:  06/27/22           Normal Device Function?:  Yes.     Electrophysiologist's Recommendations: Have magnet available. Provide continuous ECG monitoring when magnet is used or reprogramming is to be performed.  Procedure will likely interfere with device function.  Device should be programmed:  Asynchronous pacing during procedure and returned to normal programming after procedure    Last visit with cardiologist Dr. Acie Fredrickson was on 07/13/22. He reported having some progressive exertional dyspnea and also muscles feeling stiff and sore when he wakes up. He did not hear a recurrent obstructive murmur, but recommended an echocardiogram to further evaluate his LV and valvular function. A Lexiscan Myoview study was also ordered to rule out ischemia as the cause of his DOE. This was done on 07/19/22, but results are pending. His echo has not been scheduled.  Mr. Thum had his  pre-operative visit with Dr. Redmond Baseman on 07/18/22. Dr. Redmond Baseman note does not indicate that he is aware that patient was in the process of getting cardiac testing for DOE. I spoke with his scheduler Angie and informed her that patient would require preoperative clearance. She will discuss with Dr. Redmond Baseman.    VS:  BP Readings from Last 3 Encounters:  07/13/22 128/86  05/17/22 (!) 143/81  08/24/21 134/70   Pulse Readings from Last 3 Encounters:  07/13/22 78  05/17/22 73  08/24/21 87     PROVIDERS: Gaynelle Arabian, MD is PCP  Mertie Moores, MD is cardiologist Cristopher Peru, MD is EP cardiologist   LABS: Most recent lab results in [place] are from 11/26/21 include: Sodium 132, BUN 16, creatinine 1.47, glucose 94, AST 18, ALT 17, WBC 4.2, hemoglobin 14.7, hematocrit 41.5, platelet count 288.   IMAGES: CT ENT 01/28/27 ([facility]): IMPRESSION:  - Normally aerated paranasal sinuses. Patent sinus drainage pathways.  - Mild leftward deviation of the bony nasal septum posteriorly with  leftward bony spurring.     EKG: 07/13/22 ([facility]): Atrial paced rhythm with prolonged AV conduction.  Right bundle branch block.   CV: Nuclear stress test 07/19/22: Pending.   Updated echo ordered on 07/13/22 but has not been scheduled yet.   Echo 11/04/20: IMPRESSIONS   1. Left ventricular ejection fraction, by estimation, is 65 to 70%. The  left ventricle has normal function. The left ventricle has no regional  wall motion abnormalities. There is moderate asymmetric left ventricular  hypertrophy of the basal-septal  segment. Left ventricular diastolic parameters are consistent with  Grade I  diastolic dysfunction (impaired relaxation).   2. Right ventricular systolic function is normal. The right ventricular  size is normal. There is normal pulmonary artery systolic pressure. The  estimated right ventricular systolic pressure is 17.4 mmHg.   3. The mitral valve is normal in structure. Trivial  mitral valve  regurgitation. No evidence of mitral stenosis.   4. The aortic valve is tricuspid. Aortic valve regurgitation is not  visualized. No aortic stenosis is present.   5. The inferior vena cava is normal in size with greater than 50%  respiratory variability, suggesting right atrial pressure of 3 mmHg.   EP Procedure 07/26/17: CONCLUSIONS:   1. Successful implantation of a Medtronic dual-chamber pacemaker for symptomatic bradycardia due to intermittent CHB  2. No early apparent complications.    Alcohol septal ablation procedure 07/19/17 ([facility]). - Ethanol 100% 2 cc injected slowly over 10 minutes  - TTE showed akinesis of basal septum but otherwise normal wall motion in anterior wall  - Post ablation: 100% occlusion of septal with intact, unchanged LAD on coronary angiography.  TTE shows stunning/hypocontractile septum/LVOT with normal wall motion in LV apex and anterior wall.     Cardiac cath 04/10/17: Conclusions: Mild, non-obstructive coronary artery disease, with 30% ostial RCA stenosis. Significant dynamic LVOT gradient (10-15 mmHg at rest), which increased to 50-55 mmHg with Valsalva and 120 mmHg post-PVC. Upper normal right heart filling pressure. Normal PCWP and mildly elevated LVEDP. Normal pulmonary artery pressure. Normal Fick cardiac output/index.   Recommendations: Medical therapy and risk factor modification for non-obstructive CAD. Aggressive medical therapy for hypertrophic cardiomyopathy with dynamic LVOT obstruction. If symptoms persist despite optimal medical therapy, septal reduction procedure should be considered.   MRI Cardiac 01/16/17: IMPRESSION: 1. Normal LV size with severe asymmetric septal hypertrophy. EF 74%. There was mitral valve SAM with moderate mitral regurgitation and turbulence across the LV outflow tract suggestive of a significant LVOT gradient. 2.  Normal RV size and systolic function. 3. Possible subtle mid-wall LGE in the basal to  mid inferolateral wall (not definite). 4 . This picture is consistent with hypertrophic obstructive cardiomyopathy.   Past Medical History:  Diagnosis Date   Depression    HOCM (hypertrophic obstructive cardiomyopathy) ([place]) 06/24/2016   cMRI 3/18: Severe asymm septal hypertrophy, EF 74, SAM, mod MR, turb flow sugg of significant LVOT gradient, picture c/w HOCM // Echo 6/17: Mild conc LVH, LVOT gradient 14, EF 65-70, mild MR // CP stress test 6/17: NL functional capacity compared to matched sedentary norms. Pt primarily limited by body habitus and deconditioning // Echo 9/12: EF 69.8, LVOT gradient 16    Hyperlipidemia    Presence of permanent cardiac pacemaker    Sleep apnea    Testicular cancer ([place]) 1990   h/o orchiectomy    Past Surgical History:  Procedure Laterality Date   DRUG INDUCED ENDOSCOPY Right 05/17/2022   Procedure: DRUG INDUCED ENDOSCOPY;  Surgeon: Melida Quitter, MD;  Location: [place];  Service: ENT;  Laterality: Right;   ORCHIECTOMY     PACEMAKER IMPLANT N/A 07/26/2017   Procedure: Pacemaker Implant;  Surgeon: Evans Lance, MD;  Location: [place] [facility];  Service: Cardiovascular;  Laterality: N/A;   RIGHT/LEFT HEART CATH AND CORONARY ANGIOGRAPHY N/A 04/10/2017   Procedure: Right/Left Heart Cath and Coronary Angiography;  Surgeon: Nelva Bush, MD;  Location: [place] [facility];  Service: Cardiovascular;  Laterality: N/A;   TEMPORARY PACEMAKER N/A 07/25/2017   Procedure: Temporary Pacemaker;  Surgeon:  Sherren Mocha, MD;  Location: [place] [facility];  Service: Cardiovascular;  Laterality: N/A;    MEDICATIONS: No current facility-administered medications for this encounter.    aspirin EC 81 MG tablet   buPROPion (WELLBUTRIN SR) 100 MG 12 hr tablet   ibuprofen (ADVIL) 200 MG tablet   lamoTRIgine (LAMICTAL) 200 MG tablet   metoprolol succinate (TOPROL-XL) 100 MG 24 hr tablet   pravastatin (PRAVACHOL) 80 MG tablet   QUEtiapine (SEROQUEL)  200 MG tablet   Testosterone 1.62 % GEL   venlafaxine XR (EFFEXOR-XR) 150 MG 24 hr capsule    Myra Gianotti, PA-C Surgical Short Stay/Anesthesiology [facility] Phone [phone] [facility] Phone [phone] 07/19/2022 4:12 PM

## 2022-07-20 ENCOUNTER — Telehealth (INDEPENDENT_AMBULATORY_CARE_PROVIDER_SITE_OTHER): Payer: Self-pay | Admitting: Cardiovascular Disease

## 2022-07-20 ENCOUNTER — Ambulatory Visit (HOSPITAL_COMMUNITY): Admission: RE | Admit: 2022-07-20 | Payer: Medicare Other | Source: Home / Self Care | Admitting: Otolaryngology

## 2022-07-20 ENCOUNTER — Encounter (HOSPITAL_COMMUNITY): Admission: RE | Payer: Self-pay | Source: Home / Self Care

## 2022-07-20 HISTORY — DX: Presence of cardiac pacemaker: Z95.0

## 2022-07-20 SURGERY — INSERTION, HYPOGLOSSAL NERVE STIMULATOR
Anesthesia: General | Laterality: Bilateral

## 2022-07-20 NOTE — Telephone Encounter (Signed)
   [facility] Pre-operative Risk Assessment    Request for surgical clearance:  What type of surgery is being performed?  Inspire Implant   When is this surgery scheduled?  07/27/22   What type of clearance is required (medical clearance vs. Pharmacy clearance to hold med vs. Both)?  Both   Are there any medications that need to be held prior to surgery and how long? Their office is requesting our advisement regarding medication   Practice name and name of physician performing surgery?  [facility]  Dr. Redmond Baseman   What is your office phone number? [phone]    7.   What is your office fax number? [phone] (attn: Angie)   8.   Anesthesia type (None, local, MAC, general) ?  General    Michael Barton 07/20/2022, 9:43 AM

## 2022-07-21 NOTE — Telephone Encounter (Signed)
Covering preop today. Patient recently seen in clinic by Dr. Acie Fredrickson with worsening SOB, Stress test and echo planned. His note mentions patient is scheduled for Inspire device for which clearance requested here. Will route to Dr. Acie Fredrickson for input on surgical clearance under general anesthesia for Inspire implant. Procedure date scheduled 07/27/22. Dr. Acie Fredrickson - Please route response to P CV DIV PREOP (the pre-op pool). Thank you.

## 2022-07-21 NOTE — Telephone Encounter (Addendum)
   Patient Name: Michael Barton  DOB: [birth year] MRN: [id number]  Primary Cardiologist: Mertie Moores, MD  Addendum to prior message to surgical team - stress test WAS done 07/19/22, Dr. Acie Fredrickson is awaiting echocardiogram that will be done tomorrow to provide consolidated final recommendation. Further recommendations forthcoming once this is done. Will route update to surgeon once more.  Charlie Pitter, PA-C 07/21/2022, 5:48 PM

## 2022-07-21 NOTE — Telephone Encounter (Signed)
   Patient Name: Michael Barton  DOB: [birth year] MRN: [id number]  Primary Cardiologist: Mertie Moores, MD  Chart reviewed as part of pre-operative protocol coverage. Reviewed with Dr. Acie Fredrickson by secure chat. He recommends to await echocardiogram and Lexiscan nuclear stress before weighing in on clearance. The echo is scheduled but nuclear stress test is not. Will route to callback to ensure nuc gets scheduled. Will route update to requesting surgeon to let them know that further testing is pending before patient can be cleared for surgery.  Charlie Pitter, PA-C 07/21/2022, 5:20 PM

## 2022-07-22 ENCOUNTER — Other Ambulatory Visit: Payer: Self-pay | Admitting: Cardiovascular Disease

## 2022-07-22 ENCOUNTER — Other Ambulatory Visit (HOSPITAL_COMMUNITY): Payer: Medicare Other

## 2022-07-22 ENCOUNTER — Ambulatory Visit (HOSPITAL_COMMUNITY): Payer: Medicare Other | Attending: Cardiovascular Disease

## 2022-07-22 DIAGNOSIS — R0609 Other forms of dyspnea: Secondary | ICD-10-CM | POA: Diagnosis not present

## 2022-07-22 DIAGNOSIS — E291 Testicular hypofunction: Secondary | ICD-10-CM | POA: Diagnosis not present

## 2022-07-22 DIAGNOSIS — I421 Obstructive hypertrophic cardiomyopathy: Secondary | ICD-10-CM | POA: Diagnosis not present

## 2022-07-22 LAB — ECHOCARDIOGRAM COMPLETE
Area-P 1/2: 2.26 cm2
S' Lateral: 3.3 cm

## 2022-07-22 MED ORDER — PERFLUTREN LIPID MICROSPHERE
1.0000 mL | INTRAVENOUS | Status: AC | PRN
  Administered 2022-07-22: 1 mL via INTRAVENOUS

## 2022-07-22 NOTE — Telephone Encounter (Signed)
   The patient is at low risk for his upcoming implantation of the inspire unit.  He may hold his aspirin for 5 to 7 days prior to the procedure.    Mertie Moores, MD  07/22/2022 5:51 PM    [address],  [place], [place]  [address] Phone: [phone]; Fax: [phone]

## 2022-07-22 NOTE — Telephone Encounter (Signed)
Once echo has been completed and read by cardiologist and clearance has been given we will fax over notes at that time.

## 2022-07-25 NOTE — Telephone Encounter (Signed)
   Patient Name: Michael Barton  DOB: 11-13-1945 MRN: [id number]  Primary Cardiologist: Mertie Moores, MD  Chart reviewed as part of pre-operative protocol coverage. Pre-op clearance already addressed by colleagues in earlier phone notes. To summarize recommendations:  -The patient is at low risk for his upcoming implantation of the inspire unit.  He may hold his aspirin for 5 to 7 days prior to the procedure.  Please resume medically safe to do so.  -Dr. Acie Fredrickson  Will route this bundled recommendation to requesting provider via Epic fax function and remove from pre-op pool. Please call with questions.  Elgie Collard, PA-C 07/25/2022, 8:39 AM

## 2022-07-26 NOTE — Progress Notes (Signed)
Remote pacemaker transmission.   

## 2022-07-28 NOTE — Progress Notes (Signed)
Contacted [place] with [employer] about patients surgery scheduled for Monday 9/25.  Patient information sent to him.  Device orders sent to [facility]

## 2022-07-29 ENCOUNTER — Encounter (HOSPITAL_COMMUNITY): Payer: Self-pay | Admitting: Otolaryngology

## 2022-07-29 ENCOUNTER — Other Ambulatory Visit: Payer: Self-pay

## 2022-07-29 DIAGNOSIS — E291 Testicular hypofunction: Secondary | ICD-10-CM | POA: Diagnosis not present

## 2022-07-29 NOTE — Progress Notes (Signed)
Anesthesia Chart Review: Michael Barton  Case: [id number] Date/Time: 08/01/22 1515   Procedure: IMPLANTATION OF HYPOGLOSSAL NERVE STIMULATOR (Bilateral) - RNFA Requested   Anesthesia type: General   Pre-op diagnosis: Obstructive sleep apnea syndrome   Location: [place] OR ROOM 09 / [place] OR   Surgeons: Melida Quitter, MD       DISCUSSION: Patient is a 76 year old male scheduled for the above procedure. S/p drug induced sleep endoscopy 05/17/22. Surgery was initially scheduled for 07/20/22, but he still had pending cardiac studies. These have now been completed.    History includes never smoker, HOCM (s/p alcohol septal ablation procedure 07/19/17 at [facility]), PPM (Medtronic 07/26/17), OSA, HLD, testicular cancer (s/p orchiectomy 1990).    Last visit with cardiologist EP Dr. Lovena Le was on 08/24/21. Medtronic DDD PPM working normally. He was pacing minimally in the ventricle. Continue current medications.   EP perioperative PPM recommendations: Device Information: Clinic EP Physician:  Cristopher Peru, MD  Device Type:  Pacemaker Manufacturer and Phone #:  Medtronic: [phone] Pacemaker Dependent?:  Unknown Date of Last Device Check:  06/27/22           Normal Device Function?:  Yes.     Electrophysiologist's Recommendations: Have magnet available. Provide continuous ECG monitoring when magnet is used or reprogramming is to be performed.  Procedure will likely interfere with device function.  Device should be programmed:  Asynchronous pacing during procedure and returned to normal programming after procedure    Last visit with cardiologist Dr. Acie Fredrickson was on 07/13/22. He reported having some progressive exertional dyspnea and also muscles feeling stiff and sore when he wakes up. He did not hear a recurrent obstructive murmur, but recommended an echocardiogram to further evaluate his LV and valvular function. A Lexiscan Myoview study was also ordered to rule out ischemia as the cause of his DOE. Following  test results (see below CV), Dr. Acie Fredrickson wrote, "The patient is at low risk for his upcoming implantation of the inspire unit.   He may hold his aspirin for 5 to 7 days prior to the procedure."  He will get labs on arrival was indicated and anesthesia team evaluation on the day of surgery.   VS:  BP Readings from Last 3 Encounters:  07/13/22 128/86  05/17/22 (!) 143/81  08/24/21 134/70   Pulse Readings from Last 3 Encounters:  07/13/22 78  05/17/22 73  08/24/21 87     PROVIDERS: Gaynelle Arabian, MD is PCP  Mertie Moores, MD is cardiologist Cristopher Peru, MD is EP cardiologist   LABS: Most recent lab results in [place] are from 11/26/21 include: Sodium 132, BUN 16, creatinine 1.47, glucose 94, AST 18, ALT 17, WBC 4.2, hemoglobin 14.7, hematocrit 41.5, platelet count 288.    IMAGES: CT ENT 01/28/27 ([facility]): IMPRESSION:  - Normally aerated paranasal sinuses. Patent sinus drainage pathways.  - Mild leftward deviation of the bony nasal septum posteriorly with  leftward bony spurring.       EKG: 07/13/22 ([facility]): Atrial paced rhythm with prolonged AV conduction.  Right bundle branch block.     CV: Echo 07/22/22: IMPRESSIONS   1. Left ventricular ejection fraction, by estimation, is 65 to 70%. The  left ventricle has normal function. The left ventricle demonstrates  regional wall motion abnormalities (see scoring diagram/findings for  description). There is moderate concentric  left ventricular hypertrophy. Left ventricular diastolic parameters are  consistent with Grade I diastolic dysfunction (impaired relaxation). There  is akinesis of the left ventricular, basal  septal wall.   2. Right ventricular systolic function is normal. The right ventricular  size is normal. There is normal pulmonary artery systolic pressure. The  estimated right ventricular systolic pressure is 10.2 mmHg.   3. Left atrial size was mildly dilated.   4. The mitral valve is normal in  structure. Mild mitral valve  regurgitation.   5. The aortic valve is tricuspid. Aortic valve regurgitation is mild. No  aortic stenosis is present.   6. The inferior vena cava is normal in size with greater than 50%  respiratory variability, suggesting right atrial pressure of 3 mmHg.  - Comparison(s): No significant change from prior study. Prior images  reviewed side by side. 11/04/20 EF 65-70%. PA pressure 61mHg.    Nuclear stress test 07/19/22:     Findings are consistent with prior myocardial infarction. The study is intermediate risk.   No ST deviation was noted.   LV perfusion is abnormal. Defect 1: There is a small defect with severe reduction in uptake present in the mid to basal inferoseptal location(s) that is fixed. There is abnormal wall motion in the defect area. Consistent with infarction. Defect 2: There is a small defect with moderate reduction in uptake present in the basal anteroseptal location(s) that is fixed. Does not follow typical coronary distribution. May be related to membranous septum. Would recommend TTE to assess for wall motion.   Left ventricular function is abnormal. Global function is mildly reduced. Nuclear stress EF: 51 %. The left ventricular ejection fraction is mildly decreased (45-54%). End diastolic cavity size is normal.   Prior study available for comparison from 03/28/2016. Current study with septal perfusion defects as detailed above.    EP Procedure 07/26/17: CONCLUSIONS:   1. Successful implantation of a Medtronic dual-chamber pacemaker for symptomatic bradycardia due to intermittent CHB  2. No early apparent complications.      Alcohol septal ablation procedure 07/19/17 ([facility]). - Ethanol 100% 2 cc injected slowly over 10 minutes  - TTE showed akinesis of basal septum but otherwise normal wall motion in anterior wall  - Post ablation: 100% occlusion of septal with intact, unchanged LAD on coronary angiography.  TTE shows stunning/hypocontractile  septum/LVOT with normal wall motion in LV apex and anterior wall.       Cardiac cath 04/10/17: Conclusions: Mild, non-obstructive coronary artery disease, with 30% ostial RCA stenosis. Significant dynamic LVOT gradient (10-15 mmHg at rest), which increased to 50-55 mmHg with Valsalva and 120 mmHg post-PVC. Upper normal right heart filling pressure. Normal PCWP and mildly elevated LVEDP. Normal pulmonary artery pressure. Normal Fick cardiac output/index.   Recommendations: Medical therapy and risk factor modification for non-obstructive CAD. Aggressive medical therapy for hypertrophic cardiomyopathy with dynamic LVOT obstruction. If symptoms persist despite optimal medical therapy, septal reduction procedure should be considered.     MRI Cardiac 01/16/17: IMPRESSION: 1. Normal LV size with severe asymmetric septal hypertrophy. EF 74%. There was mitral valve SAM with moderate mitral regurgitation and turbulence across the LV outflow tract suggestive of a significant LVOT gradient. 2.  Normal RV size and systolic function. 3. Possible subtle mid-wall LGE in the basal to mid inferolateral wall (not definite). 4 . This picture is consistent with hypertrophic obstructive cardiomyopathy.    Past Medical History:  Diagnosis Date   Depression    HOCM (hypertrophic obstructive cardiomyopathy) ([place] 06/24/2016   cMRI 3/18: Severe asymm septal hypertrophy, EF 74, SAM, mod MR, turb flow sugg of significant LVOT gradient, picture c/w HOCM // Echo 6/17:  Mild conc LVH, LVOT gradient 14, EF 65-70, mild MR // CP stress test 6/17: NL functional capacity compared to matched sedentary norms. Pt primarily limited by body habitus and deconditioning // Echo 9/12: EF 69.8, LVOT gradient 16    Hyperlipidemia    Presence of permanent cardiac pacemaker    Sleep apnea    Testicular cancer ([place]) 1990   h/o orchiectomy    Past Surgical History:  Procedure Laterality Date   DRUG INDUCED ENDOSCOPY Right  05/17/2022   Procedure: DRUG INDUCED ENDOSCOPY;  Surgeon: Melida Quitter, MD;  Location: [place];  Service: ENT;  Laterality: Right;   ORCHIECTOMY     PACEMAKER IMPLANT N/A 07/26/2017   Procedure: Pacemaker Implant;  Surgeon: Evans Lance, MD;  Location: [place] [facility];  Service: Cardiovascular;  Laterality: N/A;   RIGHT/LEFT HEART CATH AND CORONARY ANGIOGRAPHY N/A 04/10/2017   Procedure: Right/Left Heart Cath and Coronary Angiography;  Surgeon: Nelva Bush, MD;  Location: [place] [facility];  Service: Cardiovascular;  Laterality: N/A;   TEMPORARY PACEMAKER N/A 07/25/2017   Procedure: Temporary Pacemaker;  Surgeon: Sherren Mocha, MD;  Location: [place] [facility];  Service: Cardiovascular;  Laterality: N/A;    MEDICATIONS: No current facility-administered medications for this encounter.    aspirin EC 81 MG tablet   buPROPion (WELLBUTRIN SR) 100 MG 12 hr tablet   ibuprofen (ADVIL) 200 MG tablet   lamoTRIgine (LAMICTAL) 200 MG tablet   metoprolol succinate (TOPROL-XL) 100 MG 24 hr tablet   pravastatin (PRAVACHOL) 80 MG tablet   QUEtiapine (SEROQUEL) 200 MG tablet   Testosterone 1.62 % GEL   venlafaxine XR (EFFEXOR-XR) 150 MG 24 hr capsule    Myra Gianotti, PA-C Surgical Short Stay/Anesthesiology [facility] Phone [phone] [facility] Phone [phone] 07/29/2022 4:00 PM

## 2022-07-29 NOTE — Anesthesia Preprocedure Evaluation (Signed)
Anesthesia Evaluation Anesthesia Physical Anesthesia Plan  ASA:   Anesthesia Plan:    Post-op Pain Management:    Induction:   PONV Risk Score and Plan:   Airway Management Planned:   Additional Equipment:   Intra-op Plan:   Post-operative Plan:   Informed Consent:   Plan Discussed with:   Anesthesia Plan Comments: (PAT note written 07/29/2022 by Myra Gianotti, PA-C. )        Anesthesia Quick Evaluation

## 2022-07-29 NOTE — Progress Notes (Signed)
Spoke with pt for pre-op call. Pt has hx of hypertrophic obstructive cardiomyopathy and symptomatic bradycardia. Pt has had a pacemaker since 2018. Pt's cardiologists are Dr. Acie Fredrickson and Dr. Lovena Le. Cardiac clearance is noted on Echo report on 07/22/22. Pt states he is not diabetic.   Shower instructions given to pt and he voiced understanding.

## 2022-08-01 ENCOUNTER — Ambulatory Visit (HOSPITAL_COMMUNITY)
Admission: RE | Admit: 2022-08-01 | Discharge: 2022-08-01 | Disposition: A | Discharge status: Home / Self Care | Payer: Medicare Other | Attending: Otolaryngology | Admitting: Otolaryngology

## 2022-08-01 ENCOUNTER — Other Ambulatory Visit: Payer: Self-pay

## 2022-08-01 ENCOUNTER — Encounter (HOSPITAL_COMMUNITY)
Admission: RE | Disposition: A | Discharge status: Home / Self Care | Payer: Self-pay | Source: Home / Self Care | Attending: Otolaryngology

## 2022-08-01 ENCOUNTER — Ambulatory Visit (HOSPITAL_BASED_OUTPATIENT_CLINIC_OR_DEPARTMENT_OTHER): Payer: Medicare Other | Admitting: Vascular Surgery

## 2022-08-01 ENCOUNTER — Encounter (HOSPITAL_COMMUNITY): Payer: Self-pay | Admitting: Otolaryngology

## 2022-08-01 ENCOUNTER — Ambulatory Visit (HOSPITAL_COMMUNITY): Payer: Medicare Other | Admitting: Vascular Surgery

## 2022-08-01 ENCOUNTER — Ambulatory Visit (HOSPITAL_COMMUNITY): Payer: Medicare Other

## 2022-08-01 DIAGNOSIS — Z79899 Other long term (current) drug therapy: Secondary | ICD-10-CM | POA: Diagnosis not present

## 2022-08-01 DIAGNOSIS — Z950 Presence of cardiac pacemaker: Secondary | ICD-10-CM | POA: Insufficient documentation

## 2022-08-01 DIAGNOSIS — I421 Obstructive hypertrophic cardiomyopathy: Secondary | ICD-10-CM | POA: Diagnosis not present

## 2022-08-01 DIAGNOSIS — Z6826 Body mass index (BMI) 26.0-26.9, adult: Secondary | ICD-10-CM | POA: Diagnosis not present

## 2022-08-01 DIAGNOSIS — I509 Heart failure, unspecified: Secondary | ICD-10-CM

## 2022-08-01 DIAGNOSIS — I110 Hypertensive heart disease with heart failure: Secondary | ICD-10-CM | POA: Diagnosis not present

## 2022-08-01 DIAGNOSIS — Z8547 Personal history of malignant neoplasm of testis: Secondary | ICD-10-CM | POA: Insufficient documentation

## 2022-08-01 DIAGNOSIS — G4733 Obstructive sleep apnea (adult) (pediatric): Secondary | ICD-10-CM | POA: Insufficient documentation

## 2022-08-01 DIAGNOSIS — F319 Bipolar disorder, unspecified: Secondary | ICD-10-CM | POA: Diagnosis not present

## 2022-08-01 HISTORY — PX: IMPLANTATION OF HYPOGLOSSAL NERVE STIMULATOR: SHX6827

## 2022-08-01 LAB — BASIC METABOLIC PANEL
Anion gap: 10 (ref 5–15)
BUN: 14 mg/dL (ref 8–23)
CO2: 23 mmol/L (ref 22–32)
Calcium: 9.3 mg/dL (ref 8.9–10.3)
Chloride: 98 mmol/L (ref 98–111)
Creatinine, Ser: 1.25 mg/dL — ABNORMAL HIGH (ref 0.61–1.24)
GFR, Estimated: >60 mL/min (ref >60)
Glucose, Bld: 95 mg/dL (ref 70–99)
Potassium: 4.6 mmol/L (ref 3.5–5.1)
Sodium: 131 mmol/L — ABNORMAL LOW (ref 135–145)

## 2022-08-01 LAB — CBC
HCT: 45.4 % (ref 39.0–52.0)
Hemoglobin: 15.2 g/dL (ref 13.0–17.0)
MCH: 30.5 pg (ref 26.0–34.0)
MCHC: 33.5 g/dL (ref 30.0–36.0)
MCV: 91 fL (ref 80.0–100.0)
Platelets: 221 10*3/uL (ref 150–400)
RBC: 4.99 MIL/uL (ref 4.22–5.81)
RDW: 12.5 % (ref 11.5–15.5)
WBC: 4.8 10*3/uL (ref 4.0–10.5)
nRBC: 0 % (ref 0.0–0.2)

## 2022-08-01 SURGERY — INSERTION, HYPOGLOSSAL NERVE STIMULATOR
Anesthesia: General | Site: Chest | Laterality: Right

## 2022-08-01 MED ORDER — LIDOCAINE-EPINEPHRINE 1 %-1:100000 IJ SOLN
INTRAMUSCULAR | Status: AC
  Filled 2022-08-01: qty 1

## 2022-08-01 MED ORDER — STERILE WATER FOR IRRIGATION IR SOLN
Status: DC | PRN
  Administered 2022-08-01: 1000 mL

## 2022-08-01 MED ORDER — FENTANYL CITRATE (PF) 250 MCG/5ML IJ SOLN
INTRAMUSCULAR | Status: AC
  Filled 2022-08-01: qty 5

## 2022-08-01 MED ORDER — ONDANSETRON HCL 4 MG/2ML IJ SOLN
INTRAMUSCULAR | Status: DC | PRN
  Administered 2022-08-01: 4 mg via INTRAVENOUS

## 2022-08-01 MED ORDER — 0.9 % SODIUM CHLORIDE (POUR BTL) OPTIME
TOPICAL | Status: DC | PRN
  Administered 2022-08-01: 1000 mL

## 2022-08-01 MED ORDER — ONDANSETRON HCL 4 MG/2ML IJ SOLN
INTRAMUSCULAR | Status: AC
Start: 2022-08-01 — End: ?
  Filled 2022-08-01: qty 2

## 2022-08-01 MED ORDER — PHENYLEPHRINE 80 MCG/ML (10ML) SYRINGE FOR IV PUSH (FOR BLOOD PRESSURE SUPPORT)
PREFILLED_SYRINGE | INTRAVENOUS | Status: DC | PRN
  Administered 2022-08-01 (×7): 160 ug via INTRAVENOUS

## 2022-08-01 MED ORDER — DEXAMETHASONE SODIUM PHOSPHATE 10 MG/ML IJ SOLN
INTRAMUSCULAR | Status: DC | PRN
  Administered 2022-08-01: 5 mg via INTRAVENOUS

## 2022-08-01 MED ORDER — PROPOFOL 10 MG/ML IV BOLUS
INTRAVENOUS | Status: DC | PRN
  Administered 2022-08-01: 170 mg via INTRAVENOUS

## 2022-08-01 MED ORDER — ONDANSETRON HCL 4 MG/2ML IJ SOLN: 4.0000 mg | Freq: Once | INTRAMUSCULAR | Status: DC | PRN

## 2022-08-01 MED ORDER — PHENYLEPHRINE HCL (PRESSORS) 10 MG/ML IV SOLN
INTRAVENOUS | Status: AC
  Filled 2022-08-01: qty 1

## 2022-08-01 MED ORDER — LIDOCAINE 2% (20 MG/ML) 5 ML SYRINGE
INTRAMUSCULAR | Status: AC
Start: 2022-08-01 — End: ?
  Filled 2022-08-01: qty 5

## 2022-08-01 MED ORDER — LIDOCAINE-EPINEPHRINE 1 %-1:100000 IJ SOLN
INTRAMUSCULAR | Status: DC | PRN
  Administered 2022-08-01: 4 mL

## 2022-08-01 MED ORDER — LIDOCAINE 2% (20 MG/ML) 5 ML SYRINGE
INTRAMUSCULAR | Status: DC | PRN
  Administered 2022-08-01: 80 mg via INTRAVENOUS

## 2022-08-01 MED ORDER — ORAL CARE MOUTH RINSE: 15.0000 mL | Freq: Once | OROMUCOSAL | Status: AC

## 2022-08-01 MED ORDER — HYDROCODONE-ACETAMINOPHEN 5-325 MG PO TABS: 1.0000 | ORAL_TABLET | Freq: Four times a day (QID) | ORAL | 0 refills | Status: AC | PRN

## 2022-08-01 MED ORDER — EPHEDRINE SULFATE-NACL 50-0.9 MG/10ML-% IV SOSY
PREFILLED_SYRINGE | INTRAVENOUS | Status: DC | PRN
  Administered 2022-08-01 (×4): 5 mg via INTRAVENOUS

## 2022-08-01 MED ORDER — FENTANYL CITRATE (PF) 250 MCG/5ML IJ SOLN
INTRAMUSCULAR | Status: DC | PRN
  Administered 2022-08-01: 50 ug via INTRAVENOUS
  Administered 2022-08-01: 100 ug via INTRAVENOUS

## 2022-08-01 MED ORDER — FENTANYL CITRATE (PF) 100 MCG/2ML IJ SOLN: 25.0000 ug | INTRAMUSCULAR | Status: DC | PRN

## 2022-08-01 MED ORDER — SUCCINYLCHOLINE 20MG/ML (10ML) SYRINGE FOR MEDFUSION PUMP - OPTIME
INTRAMUSCULAR | Status: DC | PRN
  Administered 2022-08-01: 120 mg via INTRAVENOUS

## 2022-08-01 MED ORDER — LACTATED RINGERS IV SOLN
INTRAVENOUS | Status: DC
Start: 2022-08-01 — End: 2022-08-01

## 2022-08-01 MED ORDER — DEXAMETHASONE SODIUM PHOSPHATE 10 MG/ML IJ SOLN
INTRAMUSCULAR | Status: AC
Start: 2022-08-01 — End: ?
  Filled 2022-08-01: qty 1

## 2022-08-01 MED ORDER — CEFAZOLIN SODIUM-DEXTROSE 2-3 GM-%(50ML) IV SOLR
INTRAVENOUS | Status: DC | PRN
  Administered 2022-08-01: 2 g via INTRAVENOUS

## 2022-08-01 MED ORDER — PHENYLEPHRINE HCL-NACL 20-0.9 MG/250ML-% IV SOLN
INTRAVENOUS | Status: DC | PRN
  Administered 2022-08-01: 50 ug/min via INTRAVENOUS

## 2022-08-01 MED ORDER — ACETAMINOPHEN 500 MG PO TABS
1000.0000 mg | ORAL_TABLET | Freq: Once | ORAL | Status: AC
  Administered 2022-08-01: 1000 mg via ORAL
  Filled 2022-08-01: qty 2

## 2022-08-01 MED ORDER — CHLORHEXIDINE GLUCONATE 0.12 % MT SOLN
15.0000 mL | Freq: Once | OROMUCOSAL | Status: AC
Start: 2022-08-01 — End: 2022-08-01
  Administered 2022-08-01: 15 mL via OROMUCOSAL
  Filled 2022-08-01: qty 15

## 2022-08-01 MED ORDER — SUCCINYLCHOLINE CHLORIDE 200 MG/10ML IV SOSY
PREFILLED_SYRINGE | INTRAVENOUS | Status: AC
  Filled 2022-08-01: qty 10

## 2022-08-01 SURGICAL SUPPLY — 64 items
BLADE SURG 15 STRL LF DISP TIS (BLADE) ×3 IMPLANT
BLADE SURG 15 STRL SS (BLADE) ×1
CANISTER SUCT 3000ML PPV (MISCELLANEOUS) ×1 IMPLANT
CORD BIPOLAR FORCEPS 12FT (ELECTRODE) ×1 IMPLANT
COVER PROBE W GEL 5X96 (DRAPES) ×1 IMPLANT
COVER SURGICAL LIGHT HANDLE (MISCELLANEOUS) ×1 IMPLANT
DERMABOND ADVANCED .7 DNX12 (GAUZE/BANDAGES/DRESSINGS) ×2 IMPLANT
DRAPE C-ARM 35X43 STRL (DRAPES) ×1 IMPLANT
DRAPE HEAD BAR (DRAPES) ×1 IMPLANT
DRAPE INCISE IOBAN 66X45 STRL (DRAPES) ×1 IMPLANT
DRAPE MICROSCOPE LEICA 54X105 (DRAPES) ×1 IMPLANT
DRAPE UTILITY XL STRL (DRAPES) ×1 IMPLANT
DRSG TEGADERM 4X4.75 (GAUZE/BANDAGES/DRESSINGS) ×3 IMPLANT
ELECT COATED BLADE 2.86 ST (ELECTRODE) ×1 IMPLANT
ELECT EMG 18 NIMS (NEUROSURGERY SUPPLIES) ×1
ELECT REM PT RETURN 9FT ADLT (ELECTROSURGICAL) ×1
ELECTRODE EMG 18 NIMS (NEUROSURGERY SUPPLIES) ×1 IMPLANT
ELECTRODE REM PT RTRN 9FT ADLT (ELECTROSURGICAL) ×1 IMPLANT
FORCEPS BIPOLAR SPETZLER 8 1.0 (NEUROSURGERY SUPPLIES) ×1 IMPLANT
GAUZE 4X4 16PLY ~~LOC~~+RFID DBL (SPONGE) ×1 IMPLANT
GAUZE SPONGE 4X4 12PLY STRL (GAUZE/BANDAGES/DRESSINGS) ×1 IMPLANT
GAUZE SPONGE 4X4 12PLY STRL LF (GAUZE/BANDAGES/DRESSINGS) IMPLANT
GENERATOR PULSE INSPIRE (Generator) ×1 IMPLANT
GENERATOR PULSE INSPIRE IV (Generator) ×1 IMPLANT
GLOVE BIO SURGEON STRL SZ 6.5 (GLOVE) IMPLANT
GLOVE BIO SURGEON STRL SZ7.5 (GLOVE) ×1 IMPLANT
GOWN STRL REUS W/ TWL LRG LVL3 (GOWN DISPOSABLE) ×3 IMPLANT
GOWN STRL REUS W/TWL LRG LVL3 (GOWN DISPOSABLE) ×3
KIT BASIN OR (CUSTOM PROCEDURE TRAY) ×1 IMPLANT
KIT NEURO ACCESSORY W/WRENCH (MISCELLANEOUS) IMPLANT
KIT TURNOVER KIT B (KITS) ×1 IMPLANT
LEAD SENSING RESP INSPIRE (Lead) ×1 IMPLANT
LEAD SENSING RESP INSPIRE IV (Lead) ×1 IMPLANT
LEAD SLEEP STIM INSPIRE IV/V (Lead) ×1 IMPLANT
LEAD SLEEP STIMULATION INSPIRE (Lead) ×1 IMPLANT
LOOP VESSEL MAXI BLUE (MISCELLANEOUS) ×1 IMPLANT
LOOP VESSEL MINI RED (MISCELLANEOUS) ×1 IMPLANT
MARKER SKIN DUAL TIP RULER LAB (MISCELLANEOUS) ×2 IMPLANT
NDL HYPO 25GX1X1/2 BEV (NEEDLE) ×1 IMPLANT
NEEDLE HYPO 25GX1X1/2 BEV (NEEDLE) ×1 IMPLANT
NS IRRIG 1000ML POUR BTL (IV SOLUTION) ×1 IMPLANT
PAD ARMBOARD 7.5X6 YLW CONV (MISCELLANEOUS) ×1 IMPLANT
PASSER CATH 38CM DISP (INSTRUMENTS) ×1 IMPLANT
PENCIL SMOKE EVACUATOR (MISCELLANEOUS) ×1 IMPLANT
POSITIONER HEAD DONUT 9IN (MISCELLANEOUS) ×1 IMPLANT
PROBE NERVE STIMULATOR (NEUROSURGERY SUPPLIES) ×1 IMPLANT
REMOTE CONTROL SLEEP INSPIRE (MISCELLANEOUS) ×1 IMPLANT
SET WALTER ACTIVATION W/DRAPE (SET/KITS/TRAYS/PACK) ×1 IMPLANT
SPONGE INTESTINAL PEANUT (DISPOSABLE) ×1 IMPLANT
STAPLER VISISTAT 35W (STAPLE) ×1 IMPLANT
SUT SILK 2 0 SH (SUTURE) ×1 IMPLANT
SUT SILK 3 0 REEL (SUTURE) ×1 IMPLANT
SUT SILK 3 0 SH 30 (SUTURE) ×2 IMPLANT
SUT SILK 3-0 (SUTURE) ×1
SUT SILK 3-0 RB1 30XBRD (SUTURE) ×1
SUT VIC AB 3-0 SH 27 (SUTURE) ×2
SUT VIC AB 3-0 SH 27X BRD (SUTURE) ×2 IMPLANT
SUT VIC AB 4-0 PS2 27 (SUTURE) ×2 IMPLANT
SUTURE SILK 3-0 RB1 30XBRD (SUTURE) ×1 IMPLANT
SYR 10ML LL (SYRINGE) ×1 IMPLANT
TAPE CLOTH 4X10 WHT NS (GAUZE/BANDAGES/DRESSINGS) IMPLANT
TAPE CLOTH SURG 4X10 WHT LF (GAUZE/BANDAGES/DRESSINGS) ×1 IMPLANT
TOWEL GREEN STERILE (TOWEL DISPOSABLE) ×1 IMPLANT
TRAY ENT MC OR (CUSTOM PROCEDURE TRAY) ×1 IMPLANT

## 2022-08-01 NOTE — Brief Op Note (Signed)
08/01/2022  4:33 PM  PATIENT:  Michael Barton  76 y.o. male  PRE-OPERATIVE DIAGNOSIS:  Obstructive sleep apnea syndrome  POST-OPERATIVE DIAGNOSIS:  Obstructive sleep apnea syndrome  PROCEDURE:  Procedure(s) with comments: IMPLANTATION OF HYPOGLOSSAL NERVE STIMULATOR (Right) - RNFA Requested  SURGEON:  Surgeon(s) and Role:    Melida Quitter, MD - Primary  PHYSICIAN ASSISTANT:   ASSISTANTS: none   ANESTHESIA:   general  EBL:  Minimal   BLOOD ADMINISTERED:none  DRAINS: none   LOCAL MEDICATIONS USED:  LIDOCAINE   SPECIMEN:  No Specimen  DISPOSITION OF SPECIMEN:  N/A  COUNTS:  YES  TOURNIQUET:  * No tourniquets in log *  DICTATION: .Note written in EPIC  PLAN OF CARE: Discharge to home after PACU  PATIENT DISPOSITION:  PACU - hemodynamically stable.   Delay start of Pharmacological VTE agent (>24hrs) due to surgical blood loss or risk of bleeding: no

## 2022-08-01 NOTE — Op Note (Signed)

## 2022-08-01 NOTE — H&P (Signed)
Michael Barton is an 76 y.o. male.   Chief Complaint: Sleep apnea HPI: 76 year old male with obstructive sleep apnea who has not tolerated CPAP.  Past Medical History:  Diagnosis Date   Depression    HOCM (hypertrophic obstructive cardiomyopathy) ([place]) 06/24/2016   cMRI 3/18: Severe asymm septal hypertrophy, EF 74, SAM, mod MR, turb flow sugg of significant LVOT gradient, picture c/w HOCM // Echo 6/17: Mild conc LVH, LVOT gradient 14, EF 65-70, mild MR // CP stress test 6/17: NL functional capacity compared to matched sedentary norms. Pt primarily limited by body habitus and deconditioning // Echo 9/12: EF 69.8, LVOT gradient 16    Hyperlipidemia    Presence of permanent cardiac pacemaker    Sleep apnea    Testicular cancer ([place]) 1990   h/o orchiectomy    Past Surgical History:  Procedure Laterality Date   DRUG INDUCED ENDOSCOPY Right 05/17/2022   Procedure: DRUG INDUCED ENDOSCOPY;  Surgeon: Melida Quitter, MD;  Location: [facility];  Service: ENT;  Laterality: Right;   ORCHIECTOMY     PACEMAKER IMPLANT N/A 07/26/2017   Procedure: Pacemaker Implant;  Surgeon: Evans Lance, MD;  Location: [facility];  Service: Cardiovascular;  Laterality: N/A;   RIGHT/LEFT HEART CATH AND CORONARY ANGIOGRAPHY N/A 04/10/2017   Procedure: Right/Left Heart Cath and Coronary Angiography;  Surgeon: Nelva Bush, MD;  Location: [facility];  Service: Cardiovascular;  Laterality: N/A;   TEMPORARY PACEMAKER N/A 07/25/2017   Procedure: Temporary Pacemaker;  Surgeon: Sherren Mocha, MD;  Location: [facility];  Service: Cardiovascular;  Laterality: N/A;    Family History  Problem Relation Age of Onset   Thyroid disease Mother    Depression Mother    Cancer Father    Stroke Neg Hx    Social History:  reports that he has never smoked. He has never used smokeless tobacco. He reports current alcohol use. He reports that he does not use drugs.  Allergies:  Allergies   Allergen Reactions   Ambien [Zolpidem Tartrate] Other (See Comments)    GERD   Prozac [Fluoxetine] Other (See Comments)    "makes me feel weird"    Medications Prior to Admission  Medication Sig Dispense Refill   buPROPion (WELLBUTRIN SR) 100 MG 12 hr tablet Take 100 mg by mouth 2 (two) times daily.     ibuprofen (ADVIL) 200 MG tablet Take 600 mg by mouth every 6 (six) hours as needed for moderate pain.     lamoTRIgine (LAMICTAL) 200 MG tablet Take 200 mg by mouth daily.     metoprolol succinate (TOPROL-XL) 100 MG 24 hr tablet Take 1 tablet by mouth daily. Take with or immediately following a meal. 90 tablet 3   pravastatin (PRAVACHOL) 80 MG tablet Take 1 tablet (80 mg total) by mouth daily. 30 tablet 1   QUEtiapine (SEROQUEL) 200 MG tablet Take 200 mg by mouth at bedtime.     Testosterone 1.62 % GEL Apply 2 Pump topically in the morning. Apply 1 pump per shoulder     venlafaxine XR (EFFEXOR-XR) 150 MG 24 hr capsule Take 150 mg by mouth daily with breakfast.     aspirin EC 81 MG tablet Take 1 tablet (81 mg total) by mouth daily.      Results for orders placed or performed during the hospital encounter of 08/01/22 (from the past 48 hour(s))  CBC per protocol     Status: None   Collection Time: 08/01/22  2:01 PM  Result Value Ref Range   WBC 4.8 4.0 - 10.5 K/uL   RBC 4.99 4.22 - 5.81 MIL/uL   Hemoglobin 15.2 13.0 - 17.0 g/dL   HCT 45.4 39.0 - 52.0 %   MCV 91.0 80.0 - 100.0 fL   MCH 30.5 26.0 - 34.0 pg   MCHC 33.5 30.0 - 36.0 g/dL   RDW 12.5 11.5 - 15.5 %   Platelets 221 150 - 400 K/uL   nRBC 0.0 0.0 - 0.2 %    Comment: Performed at [facility], [place] [address] [place], [place] [address]   No results found.  Review of Systems  All other systems reviewed and are negative.   Blood pressure (!) 152/92, pulse 73, temperature 97.8 F (36.6 C), temperature source Oral, resp. rate 17, height '6\' 3"'$  (1.905 m), weight 95.3 kg, SpO2 96 %. Physical Exam Constitutional:       Appearance: Normal appearance. He is normal weight.  HENT:     Head: Normocephalic and atraumatic.     Right Ear: External ear normal.     Left Ear: External ear normal.     Nose: Nose normal.     Mouth/Throat:     Mouth: Mucous membranes are moist.     Pharynx: Oropharynx is clear.  Eyes:     Extraocular Movements: Extraocular movements intact.     Conjunctiva/sclera: Conjunctivae normal.     Pupils: Pupils are equal, round, and reactive to light.  Cardiovascular:     Rate and Rhythm: Normal rate.  Pulmonary:     Effort: Pulmonary effort is normal.  Musculoskeletal:     Cervical back: Normal range of motion.  Skin:    General: Skin is warm and dry.  Neurological:     General: No focal deficit present.     Mental Status: He is alert and oriented to person, place, and time.  Psychiatric:        Mood and Affect: Mood normal.        Behavior: Behavior normal.        Thought Content: Thought content normal.        Judgment: Judgment normal.      Assessment/Plan Obstructive sleep apnea and BMI 26.25  To OR for hypoglossal nerve stimulator placement.  Melida Quitter, MD 08/01/2022, 2:20 PM

## 2022-08-01 NOTE — Anesthesia Procedure Notes (Signed)
Procedure Name: Intubation Date/Time: 08/01/2022 3:08 PM  Performed by: Rande Brunt, CRNAPre-anesthesia Checklist: Patient identified, Emergency Drugs available, Suction available and Patient being monitored Patient Re-evaluated:Patient Re-evaluated prior to induction Oxygen Delivery Method: Circle System Utilized Preoxygenation: Pre-oxygenation with 100% oxygen Induction Type: IV induction Ventilation: Mask ventilation without difficulty Laryngoscope Size: Glidescope and 4 Grade View: Grade I Tube type: Oral Tube size: 7.5 mm Number of attempts: 1 Airway Equipment and Method: Stylet and Oral airway Placement Confirmation: ETT inserted through vocal cords under direct vision, positive ETCO2 and breath sounds checked- equal and bilateral Secured at: 24 cm Tube secured with: Tape Dental Injury: Teeth and Oropharynx as per pre-operative assessment

## 2022-08-01 NOTE — Transfer of Care (Signed)
Immediate Anesthesia Transfer of Care Note  Patient: Michael Barton  Procedure(s) Performed: IMPLANTATION OF HYPOGLOSSAL NERVE STIMULATOR (Right: Chest)  Patient Location: PACU  Anesthesia Type:General  Level of Consciousness: awake, alert  and oriented  Airway & Oxygen Therapy: Patient Spontanous Breathing and Patient connected to face mask oxygen  Post-op Assessment: Report given to RN, Post -op Vital signs reviewed and stable and Patient moving all extremities X 4  Post vital signs: Reviewed and stable  Last Vitals:  Vitals Value Taken Time  BP 158/85 08/01/22 1648  Temp    Pulse 79 08/01/22 1651  Resp 14 08/01/22 1651  SpO2 100 % 08/01/22 1651  Vitals shown include unvalidated device data.  Last Pain:  Vitals:   08/01/22 1345  TempSrc:   PainSc: 0-No pain         Complications: No notable events documented.

## 2022-08-02 ENCOUNTER — Encounter (HOSPITAL_COMMUNITY): Payer: Self-pay | Admitting: Otolaryngology

## 2022-08-02 NOTE — Anesthesia Postprocedure Evaluation (Signed)
Anesthesia Post Note  Patient: Michael Barton  Procedure(s) Performed: IMPLANTATION OF HYPOGLOSSAL NERVE STIMULATOR (Right: Chest)     Patient location during evaluation: PACU Anesthesia Type: General Level of consciousness: awake and alert Pain management: pain level controlled Vital Signs Assessment: post-procedure vital signs reviewed and stable Respiratory status: spontaneous breathing, nonlabored ventilation, respiratory function stable and patient connected to nasal cannula oxygen Cardiovascular status: blood pressure returned to baseline and stable Postop Assessment: no apparent nausea or vomiting Anesthetic complications: no   No notable events documented.  Last Vitals:  Vitals:   08/01/22 1715 08/01/22 1730  BP: (!) 150/78 (!) 142/73  Pulse: 60 63  Resp: 12 10  Temp:    SpO2: 96% 95%    Last Pain:  Vitals:   08/01/22 1730  TempSrc:   PainSc: 0-No pain                 Santa Lighter

## 2022-09-07 DIAGNOSIS — G4733 Obstructive sleep apnea (adult) (pediatric): Secondary | ICD-10-CM | POA: Diagnosis not present

## 2022-09-07 DIAGNOSIS — Z4542 Encounter for adjustment and management of neuropacemaker (brain) (peripheral nerve) (spinal cord): Secondary | ICD-10-CM | POA: Diagnosis not present

## 2022-09-19 DIAGNOSIS — Z0000 Encounter for general adult medical examination without abnormal findings: Secondary | ICD-10-CM | POA: Diagnosis not present

## 2022-09-19 DIAGNOSIS — F322 Major depressive disorder, single episode, severe without psychotic features: Secondary | ICD-10-CM | POA: Diagnosis not present

## 2022-09-19 DIAGNOSIS — G4733 Obstructive sleep apnea (adult) (pediatric): Secondary | ICD-10-CM | POA: Diagnosis not present

## 2022-09-19 DIAGNOSIS — N183 Chronic kidney disease, stage 3 unspecified: Secondary | ICD-10-CM | POA: Diagnosis not present

## 2022-09-19 DIAGNOSIS — Z950 Presence of cardiac pacemaker: Secondary | ICD-10-CM | POA: Diagnosis not present

## 2022-09-19 DIAGNOSIS — M6289 Other specified disorders of muscle: Secondary | ICD-10-CM | POA: Diagnosis not present

## 2022-09-19 DIAGNOSIS — I495 Sick sinus syndrome: Secondary | ICD-10-CM | POA: Diagnosis not present

## 2022-09-19 DIAGNOSIS — I700 Atherosclerosis of aorta: Secondary | ICD-10-CM | POA: Diagnosis not present

## 2022-09-19 DIAGNOSIS — E291 Testicular hypofunction: Secondary | ICD-10-CM | POA: Diagnosis not present

## 2022-09-19 DIAGNOSIS — I421 Obstructive hypertrophic cardiomyopathy: Secondary | ICD-10-CM | POA: Diagnosis not present

## 2022-09-19 DIAGNOSIS — E7800 Pure hypercholesterolemia, unspecified: Secondary | ICD-10-CM | POA: Diagnosis not present

## 2022-09-19 DIAGNOSIS — Z23 Encounter for immunization: Secondary | ICD-10-CM | POA: Diagnosis not present

## 2022-09-27 ENCOUNTER — Ambulatory Visit (INDEPENDENT_AMBULATORY_CARE_PROVIDER_SITE_OTHER): Payer: Medicare Other

## 2022-09-27 DIAGNOSIS — I421 Obstructive hypertrophic cardiomyopathy: Secondary | ICD-10-CM

## 2022-10-03 ENCOUNTER — Telehealth: Payer: Self-pay

## 2022-10-03 LAB — CUP PACEART REMOTE DEVICE CHECK
Battery Remaining Longevity: 105 mo
Battery Voltage: 3.01 V
Brady Statistic AP VP Percent: 0.55 %
Brady Statistic AP VS Percent: 59.17 %
Brady Statistic AS VP Percent: 0.11 %
Brady Statistic AS VS Percent: 40.17 %
Brady Statistic RA Percent Paced: 59.82 %
Brady Statistic RV Percent Paced: 0.69 %
Date Time Interrogation Session: 20231124180447
Implantable Lead Connection Status: 753985
Implantable Lead Connection Status: 753985
Implantable Lead Implant Date: 20180919
Implantable Lead Implant Date: 20180919
Implantable Lead Location: 753859
Implantable Lead Location: 753860
Implantable Lead Model: 5076
Implantable Lead Model: 5076
Implantable Pulse Generator Implant Date: 20180919
Lead Channel Impedance Value: 304 Ohm
Lead Channel Impedance Value: 323 Ohm
Lead Channel Impedance Value: 380 Ohm
Lead Channel Impedance Value: 418 Ohm
Lead Channel Pacing Threshold Amplitude: 0.5 V
Lead Channel Pacing Threshold Amplitude: 1.875 V
Lead Channel Pacing Threshold Pulse Width: 0.4 ms
Lead Channel Pacing Threshold Pulse Width: 0.4 ms
Lead Channel Sensing Intrinsic Amplitude: 3.375 mV
Lead Channel Sensing Intrinsic Amplitude: 3.375 mV
Lead Channel Sensing Intrinsic Amplitude: 4.75 mV
Lead Channel Sensing Intrinsic Amplitude: 4.75 mV
Lead Channel Setting Pacing Amplitude: 2 V
Lead Channel Setting Pacing Amplitude: 2.5 V
Lead Channel Setting Pacing Pulse Width: 0.8 ms
Lead Channel Setting Sensing Sensitivity: 1.2 mV
Zone Setting Status: 755011
Zone Setting Status: 755011

## 2022-10-03 NOTE — Telephone Encounter (Signed)
Scheduled remote reviewed. Normal device function.   3 AF/AFL events, longest duration 4hrs, no hx of PAF per EPIC Burden 0.3% - route to triage  Per review Pt had implantation of hypoglossal nerve stimulator right chest prior to afib being found on remote monitoring.  Will send to Dr. Lovena Le for review.

## 2022-10-06 NOTE — Telephone Encounter (Signed)
He will need an oac.

## 2022-10-07 MED ORDER — APIXABAN 5 MG PO TABS: 5.0000 mg | ORAL_TABLET | Freq: Two times a day (BID) | ORAL | 11 refills | Status: DC

## 2022-10-07 NOTE — Telephone Encounter (Signed)
Outreach made to Pt.  Advised afib found on his device.  Advised Dr. Lovena Le would like for Pt to start Eliquis or Xarelto.  Pt would like to start Eliquis.    Per Dr. Lovena Le Pt should stop ASA when he start Eliquis.  Advised Pt medication samples will be left for pick up at front desk along with 30 day free card.

## 2022-10-10 DIAGNOSIS — Z4542 Encounter for adjustment and management of neuropacemaker (brain) (peripheral nerve) (spinal cord): Secondary | ICD-10-CM | POA: Diagnosis not present

## 2022-10-10 DIAGNOSIS — G4733 Obstructive sleep apnea (adult) (pediatric): Secondary | ICD-10-CM | POA: Diagnosis not present

## 2022-10-17 ENCOUNTER — Telehealth: Payer: Self-pay | Admitting: Internal Medicine

## 2022-10-17 NOTE — Telephone Encounter (Signed)
Patient came in stating that they received a message from Dr. Lovena Le telling the to "stop ASA". However, the patient was confused as to what "stop ASA" meant per their medications list and would like to be contact with additional information. Patient prefers to be contacted via the email address attached to their account ([email or web address]).

## 2022-10-17 NOTE — Telephone Encounter (Signed)
Pt called back regarding message received.    Pt made aware, Dr. Lovena Le meant stop taking aspirin.  Pt  understood, no follow up required.

## 2022-10-18 ENCOUNTER — Telehealth: Payer: Self-pay

## 2022-10-18 NOTE — Telephone Encounter (Signed)
-----   Message from Evans Lance, MD sent at 10/03/2022  9:58 PM EST ----- Remote device check reviewed. Histograms appropriate. Leads and battery stable for patient. Follow up as outlined above. No recommended changes.

## 2022-10-18 NOTE — Telephone Encounter (Signed)
Pt emailed the message below from Dr. Lovena Le regarding the remote device check.    Pt called this morning, 10/18/22 to have him stop taking aspirin per Dr. Lovena Le.  Pt taking Eliquis now. Will follow up with Dr. Lovena Le regarding Pt recall, follow up visit.

## 2022-10-21 NOTE — Progress Notes (Signed)
Remote pacemaker transmission.   

## 2022-10-21 NOTE — Telephone Encounter (Signed)
6 month follow up

## 2022-10-23 ENCOUNTER — Encounter: Payer: Self-pay | Admitting: Cardiovascular Disease

## 2022-10-23 NOTE — Progress Notes (Unsigned)
Cardiology Office Note   Date:  10/24/2022   ID:  Michael Barton, DOB Dec 09, 1945, MRN [id number]  PCP:  Gaynelle Arabian, MD  Cardiologist:   Mertie Moores, MD   Chief Complaint  Patient presents with   Cardiomyopathy   1. Shortness of breath 2. Hyperlipidemia 3. Obstructive sleep apnea 4. Depression    History of Present Illness: Michael Barton is a 76 y.o. male who presents for DOE Was an avid runner and cyclist until 5 years ago. Is not able to run or cycle recently - has been a steady decline  He can now run for 1 minute and then walk for 2-3   He has noticed a definint decline in his abiltiy - now gets short of breath doing everyday activities. He become short of breath climbing stairs and walking from his office out to the car.  He denies any chest pain. He has no shortness of breath at rest. He's had an echo card gram the past which reveals left ventricular hypertrophy.  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 A stress Myoview study showed no ischemia. His ejection fraction was 58%.  [name] has not improved.   Has not had his echo yet.   Aug. 18, 2017: Michael Barton is seen for his dyspnea with exertion .    Myoview Mar 28, 2016 - no ischemia.  EF 58% Echo - June 2017:   Normal LV dysfunction .   Mild dynamic LV obstruction   Cardiopulmonary stress test:    Overall normal function capacity  He has been started on Coreg - has been increased to 6.25 BID   Can walk and cycle witout problems Has DOE with running    Nov. 27, 2017:  Michael Barton Is seen back for follow-up office visit. He had an echocardiogram which reveals a  Slight dynamic obstruction with LVH.   We increased his Coreg to 6.25 BID , And he has not noticed any significant improvement in his exertional dyspnea.  Feb. 26,  2018  Michael Barton is slightly worse. Has some symptoms or orthostasis.  The change from  Coreg to Metoprolol did not help  Still hiking - especially in the warmer months   March 01, 2017:   Michael Barton is seen back today  He had stopped the metoprolol prior to getting the MRI.  Still cant walk very far without getting severe shortness of breath .    Sept. 27, 2018:  Michael Barton is seen today for follow up  He has had an alcohol septal ablation at [facility] for his HOCM.  He had an episode of syncope the week after the ablation  And was found to have  complete AV block    He had a pacer placed, he did well but had a brief episode of syncope and was re-hospitalized .   Was found to have orthostasis .   Is feeling much better.   No further episodes of syncope  Is here for wound check and pacer check and to see me for follow up.    September 27, 2017  Michael Barton is doing well.  No CP or dyspnea.  No further episdoes of syncope since getting the pacer   Apr 02, 2020:  Michael Barton is seen today for follow up of his HOCM.  He is s/p alcohol ablation ([facility]) and pacer placement. His visit last year was by telemedicine.  Still is very active.  He hikes on a regular basis.  He is put in a garden ( squash, peppers, tomatoes, okra, zuchinni) .  He works out on the treadmill on a regular basis.  Has lost 6 lbs.    Aug. 29, 2022: Michael Barton is seen for follow up of his HOCM - s/p ablation at [facility]. 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  Has OSA  Has a pacer, Wants to investigate - Inspire.for OSA  Want to know if his pacer is compatable  I would also have concerns if the Michael Barton Part is MRI compatable    Sept. 6, 2023  Michael Barton is seen for follow up of his HOCM  He has been having some shortness of breath with exertion.  He is also noticed that his muscles are very stiff and sore when he wakes up in the morning.  Some days are worse than others   Scheduled for the [facility]   Dec. 18, 2023 Michael Barton is seen for follow up Was having more dyspnea at his last visit in Sept,  2023 [place] shoed an inf. Basalar scar - likely due to the alcohol septal ablation.     Echo normal LV function.  No LVOT gradient  Has had an Inspire procedure since I last saw him  Is working well for him   He has a pacemaker that was placed after his septal ablation.  He was found to have atrial fibrillation on his device.  The patient was started on Eliquis.  Aspirin was stopped.    Past Medical History:  Diagnosis Date   Depression    HOCM (hypertrophic obstructive cardiomyopathy) ([place]) 06/24/2016   cMRI 3/18: Severe asymm septal hypertrophy, EF 74, SAM, mod MR, turb flow sugg of significant LVOT gradient, picture c/w HOCM // Echo 6/17: Mild conc LVH, LVOT gradient 14, EF 65-70, mild MR // CP stress test 6/17: NL functional capacity compared to matched sedentary norms. Pt primarily limited by body habitus and deconditioning // Echo 9/12: EF 69.8, LVOT gradient 16    Hyperlipidemia    Presence of permanent cardiac pacemaker    Sleep apnea    Testicular cancer ([place]) 1990   h/o orchiectomy    Past Surgical History:  Procedure Laterality Date   DRUG INDUCED ENDOSCOPY Right 05/17/2022   Procedure: DRUG INDUCED ENDOSCOPY;  Surgeon: Melida Quitter, MD;  Location: [facility];  Service: ENT;  Laterality: Right;   IMPLANTATION OF HYPOGLOSSAL NERVE STIMULATOR Right 08/01/2022   Procedure: IMPLANTATION OF HYPOGLOSSAL NERVE STIMULATOR;  Surgeon: Melida Quitter, MD;  Location: [facility];  Service: ENT;  Laterality: Right;  RNFA Requested   ORCHIECTOMY     PACEMAKER IMPLANT N/A 07/26/2017   Procedure: Pacemaker Implant;  Surgeon: Evans Lance, MD;  Location: [facility];  Service: Cardiovascular;  Laterality: N/A;   RIGHT/LEFT HEART CATH AND CORONARY ANGIOGRAPHY N/A 04/10/2017   Procedure: Right/Left Heart Cath and Coronary Angiography;  Surgeon: Nelva Bush, MD;  Location: [facility];  Service: Cardiovascular;  Laterality: N/A;   TEMPORARY PACEMAKER N/A  07/25/2017   Procedure: Temporary Pacemaker;  Surgeon: Sherren Mocha, MD;  Location: [facility];  Service: Cardiovascular;  Laterality: N/A;     Current Outpatient Medications  Medication Sig Dispense Refill   apixaban (ELIQUIS) 5 MG TABS tablet Take 1 tablet (5 mg total) by mouth 2 (two) times daily. 60 tablet 11   buPROPion (  WELLBUTRIN SR) 100 MG 12 hr tablet Take 100 mg by mouth 2 (two) times daily.     HYDROcodone-acetaminophen (NORCO/VICODIN) 5-325 MG tablet Take 1-2 tablets by mouth every 6 (six) hours as needed for moderate pain. 12 tablet 0   ibuprofen (ADVIL) 200 MG tablet Take 600 mg by mouth every 6 (six) hours as needed for moderate pain.     lamoTRIgine (LAMICTAL) 200 MG tablet Take 200 mg by mouth daily.     metoprolol succinate (TOPROL-XL) 100 MG 24 hr tablet Take 1 tablet by mouth daily. Take with or immediately following a meal. 90 tablet 3   pravastatin (PRAVACHOL) 80 MG tablet Take 1 tablet (80 mg total) by mouth daily. 30 tablet 1   QUEtiapine (SEROQUEL) 200 MG tablet Take 200 mg by mouth at bedtime.     Testosterone 1.62 % GEL Apply 2 Pump topically in the morning. Apply 1 pump per shoulder     venlafaxine XR (EFFEXOR-XR) 150 MG 24 hr capsule Take 150 mg by mouth daily with breakfast.     No current facility-administered medications for this visit.    Allergies:   Ambien [zolpidem tartrate] and Prozac [fluoxetine]    Social History:  The patient  reports that he has never smoked. He has never used smokeless tobacco. He reports current alcohol use. He reports that he does not use drugs.   Family History:  The patient's family history includes Cancer in his father; Depression in his mother; Thyroid disease in his mother.    ROS:  Please see the history of present illness.     All other systems are reviewed and negative.     Physical Exam: Blood pressure 129/79, pulse 82, height '6\' 3"'$  (1.905 m), weight 211 lb (95.7 kg), SpO2 97 %.       GEN:  Well  nourished, well developed in no acute distress HEENT: Normal NECK: No JVD; No carotid bruits LYMPHATICS: No lymphadenopathy CARDIAC: RRR , no murmurs, rubs, gallops RESPIRATORY:  Clear to auscultation without rales, wheezing or rhonchi  ABDOMEN: Soft, non-tender, non-distended MUSCULOSKELETAL:  No edema; No deformity  SKIN: Warm and dry NEUROLOGIC:  Alert and oriented x 3    EKG:      Recent Labs: 08/01/2022: BUN 14; Creatinine, Ser 1.25; Hemoglobin 15.2; Platelets 221; Potassium 4.6; Sodium 131    Lipid Panel    Component Value Date/Time   CHOL 147 11/05/2020 0404   TRIG 112 11/05/2020 0404   HDL 45 11/05/2020 0404   CHOLHDL 3.3 11/05/2020 0404   VLDL 22 11/05/2020 0404   LDLCALC 80 11/05/2020 0404      Wt Readings from Last 3 Encounters:  10/24/22 211 lb (95.7 kg)  08/01/22 210 lb (95.3 kg)  07/19/22 207 lb (93.9 kg)      Other studies Reviewed: Additional studies/ records that were reviewed today include: . Review of the above records demonstrates:    ASSESSMENT AND PLAN:  1.  Dynamic left ventricular outflow tract obstruction  S/p ETOH ablation.    Will have him follow up with Dr. Gasper Sells upon my retirement     2. Complete heart block: -Status post pacemaker.  3.   Orthostatic hypotension :     4.  Sleep apnea:   He has had the inspire device implanted.  It seems to be working well.  5.  Atrial fibrillation: He was found to have paroxysmal atrial fibrillation during ICD interrogation.  He has a history of HOCM.  He is 76  years old.  He has been started on Eliquis.  Aspirin has been discontinued.  CBC and basic metabolic profile in several weeks.  He is very comfortable and regular at this point.  Will continue to watch.    Current medicines are reviewed at length with the patient today.  The patient does not have concerns regarding medicines.  The following changes have been made:  no change  Labs/ tests ordered today include:   Orders  Placed This Encounter  Procedures   CBC   Basic metabolic panel     Mertie Moores, MD  10/24/2022 6:06 PM    [facility] [place], [place], [place]  [address] Phone: [phone]; Fax: [phone]

## 2022-10-24 ENCOUNTER — Ambulatory Visit: Payer: Medicare Other | Attending: Cardiovascular Disease | Admitting: Cardiovascular Disease

## 2022-10-24 ENCOUNTER — Encounter: Payer: Self-pay | Admitting: Cardiovascular Disease

## 2022-10-24 VITALS — BP 129/79 | HR 82 | Ht 75.0 in | Wt 211.0 lb

## 2022-10-24 DIAGNOSIS — I421 Obstructive hypertrophic cardiomyopathy: Secondary | ICD-10-CM

## 2022-10-24 DIAGNOSIS — I480 Paroxysmal atrial fibrillation: Secondary | ICD-10-CM

## 2022-10-24 NOTE — Patient Instructions (Signed)
Medication Instructions:  Your physician recommends that you continue on your current medications as directed. Please refer to the Current Medication list given to you today.  *If you need a refill on your cardiac medications before your next appointment, please call your pharmacy*   Lab Work: BMET, CBC in 3 weeks If you have labs (blood work) drawn today and your tests are completely normal, you will receive your results only by: [place] (if you have MyChart) OR A paper copy in the mail If you have any lab test that is abnormal or we need to change your treatment, we will call you to review the results.   Testing/Procedures: NONE   Follow-Up: At [facility], you and your health needs are our priority.  As part of our continuing mission to provide you with exceptional heart care, we have created designated Provider Care Teams.  These Care Teams include your primary Cardiologist (physician) and Advanced Practice Providers (APPs -  Physician Assistants and Nurse Practitioners) who all work together to provide you with the care you need, when you need it.  We recommend signing up for the patient portal called "MyChart".  Sign up information is provided on this After Visit Summary.  MyChart is used to connect with patients for Virtual Visits (Telemedicine).  Patients are able to view lab/test results, encounter notes, upcoming appointments, etc.  Non-urgent messages can be sent to your provider as well.   To learn more about what you can do with MyChart, go to [email or web address].    Your next appointment:   6 month(s)  The format for your next appointment:   In Person  Provider:   Mertie Moores, MD       Important Information About Sugar

## 2022-11-10 ENCOUNTER — Telehealth: Payer: Self-pay | Admitting: Internal Medicine

## 2022-11-10 MED ORDER — APIXABAN 5 MG PO TABS: 5.0000 mg | ORAL_TABLET | Freq: Two times a day (BID) | ORAL | 5 refills | Status: DC

## 2022-11-10 NOTE — Telephone Encounter (Signed)
Rx was sent to [facility] last month with a year's worth of refills. There should not be an issue filling it but I have sent in another refill and let pt know.

## 2022-11-10 NOTE — Telephone Encounter (Signed)
Patient came in stating that they are unable to obtain Eliquis from their pharmacy ([facility] on [address]), due to the pharmacy not having the prescription/order at the moment. Patient is here requesting that a prescription be submitted on his behalf. They would also like to be notified once the information has been submitted via the mobile number in his demographics.

## 2022-11-14 ENCOUNTER — Ambulatory Visit: Payer: Medicare Other | Attending: Cardiovascular Disease

## 2022-11-14 DIAGNOSIS — I480 Paroxysmal atrial fibrillation: Secondary | ICD-10-CM

## 2022-11-14 DIAGNOSIS — I421 Obstructive hypertrophic cardiomyopathy: Secondary | ICD-10-CM | POA: Diagnosis not present

## 2022-11-15 LAB — BASIC METABOLIC PANEL
BUN/Creatinine Ratio: 17 (ref 10–24)
BUN: 25 mg/dL (ref 8–27)
CO2: 24 mmol/L (ref 20–29)
Calcium: 9.4 mg/dL (ref 8.6–10.2)
Chloride: 96 mmol/L (ref 96–106)
Creatinine, Ser: 1.5 mg/dL — ABNORMAL HIGH (ref 0.76–1.27)
Glucose: 88 mg/dL (ref 70–99)
Potassium: 4.9 mmol/L (ref 3.5–5.2)
Sodium: 135 mmol/L (ref 134–144)
eGFR: 48 mL/min/{1.73_m2} — ABNORMAL LOW (ref >59)

## 2022-11-15 LAB — CBC
Hematocrit: 42.8 % (ref 37.5–51.0)
Hemoglobin: 14.5 g/dL (ref 13.0–17.7)
MCH: 29.3 pg (ref 26.6–33.0)
MCHC: 33.9 g/dL (ref 31.5–35.7)
MCV: 87 fL (ref 79–97)
Platelets: 318 10*3/uL (ref 150–450)
RBC: 4.95 x10E6/uL (ref 4.14–5.80)
RDW: 12.6 % (ref 11.6–15.4)
WBC: 5.8 10*3/uL (ref 3.4–10.8)

## 2022-12-02 DIAGNOSIS — R799 Abnormal finding of blood chemistry, unspecified: Secondary | ICD-10-CM | POA: Diagnosis not present

## 2022-12-02 DIAGNOSIS — F3170 Bipolar disorder, currently in remission, most recent episode unspecified: Secondary | ICD-10-CM | POA: Diagnosis not present

## 2022-12-19 DIAGNOSIS — G4733 Obstructive sleep apnea (adult) (pediatric): Secondary | ICD-10-CM | POA: Diagnosis not present

## 2022-12-19 DIAGNOSIS — Z4542 Encounter for adjustment and management of neuropacemaker (brain) (peripheral nerve) (spinal cord): Secondary | ICD-10-CM | POA: Diagnosis not present

## 2022-12-27 ENCOUNTER — Ambulatory Visit (INDEPENDENT_AMBULATORY_CARE_PROVIDER_SITE_OTHER): Payer: Medicare Other

## 2022-12-27 DIAGNOSIS — I442 Atrioventricular block, complete: Secondary | ICD-10-CM

## 2022-12-28 LAB — CUP PACEART REMOTE DEVICE CHECK
Battery Remaining Longevity: 102 mo
Battery Voltage: 3 V
Brady Statistic AP VP Percent: 0.87 %
Brady Statistic AP VS Percent: 58.14 %
Brady Statistic AS VP Percent: 0.2 %
Brady Statistic AS VS Percent: 40.79 %
Brady Statistic RA Percent Paced: 59.75 %
Brady Statistic RV Percent Paced: 1.07 %
Date Time Interrogation Session: 20240220105820
Implantable Lead Connection Status: 753985
Implantable Lead Connection Status: 753985
Implantable Lead Implant Date: 20180919
Implantable Lead Implant Date: 20180919
Implantable Lead Location: 753859
Implantable Lead Location: 753860
Implantable Lead Model: 5076
Implantable Lead Model: 5076
Implantable Pulse Generator Implant Date: 20180919
Lead Channel Impedance Value: 304 Ohm
Lead Channel Impedance Value: 323 Ohm
Lead Channel Impedance Value: 380 Ohm
Lead Channel Impedance Value: 399 Ohm
Lead Channel Pacing Threshold Amplitude: 0.625 V
Lead Channel Pacing Threshold Amplitude: 1.375 V
Lead Channel Pacing Threshold Pulse Width: 0.4 ms
Lead Channel Pacing Threshold Pulse Width: 0.4 ms
Lead Channel Sensing Intrinsic Amplitude: 2.75 mV
Lead Channel Sensing Intrinsic Amplitude: 2.75 mV
Lead Channel Sensing Intrinsic Amplitude: 3 mV
Lead Channel Sensing Intrinsic Amplitude: 3 mV
Lead Channel Setting Pacing Amplitude: 2 V
Lead Channel Setting Pacing Amplitude: 2.5 V
Lead Channel Setting Pacing Pulse Width: 0.8 ms
Lead Channel Setting Sensing Sensitivity: 1.2 mV
Zone Setting Status: 755011
Zone Setting Status: 755011

## 2023-01-06 DIAGNOSIS — G4761 Periodic limb movement disorder: Secondary | ICD-10-CM | POA: Diagnosis not present

## 2023-01-06 DIAGNOSIS — G4736 Sleep related hypoventilation in conditions classified elsewhere: Secondary | ICD-10-CM | POA: Diagnosis not present

## 2023-01-06 DIAGNOSIS — G4733 Obstructive sleep apnea (adult) (pediatric): Secondary | ICD-10-CM | POA: Diagnosis not present

## 2023-01-26 NOTE — Progress Notes (Signed)
Remote pacemaker transmission.   

## 2023-02-09 DIAGNOSIS — H5319 Other subjective visual disturbances: Secondary | ICD-10-CM | POA: Diagnosis not present

## 2023-02-09 DIAGNOSIS — H52223 Regular astigmatism, bilateral: Secondary | ICD-10-CM | POA: Diagnosis not present

## 2023-03-28 ENCOUNTER — Ambulatory Visit (INDEPENDENT_AMBULATORY_CARE_PROVIDER_SITE_OTHER): Payer: Medicare Other

## 2023-03-28 DIAGNOSIS — I442 Atrioventricular block, complete: Secondary | ICD-10-CM

## 2023-03-28 LAB — CUP PACEART REMOTE DEVICE CHECK
Battery Remaining Longevity: 101 mo
Battery Voltage: 3 V
Brady Statistic AP VP Percent: 2.76 %
Brady Statistic AP VS Percent: 59.45 %
Brady Statistic AS VP Percent: 0.66 %
Brady Statistic AS VS Percent: 37.13 %
Brady Statistic RA Percent Paced: 63.1 %
Brady Statistic RV Percent Paced: 3.54 %
Date Time Interrogation Session: 20240521005831
Implantable Lead Connection Status: 753985
Implantable Lead Connection Status: 753985
Implantable Lead Implant Date: 20180919
Implantable Lead Implant Date: 20180919
Implantable Lead Location: 753859
Implantable Lead Location: 753860
Implantable Lead Model: 5076
Implantable Lead Model: 5076
Implantable Pulse Generator Implant Date: 20180919
Lead Channel Impedance Value: 285 Ohm
Lead Channel Impedance Value: 323 Ohm
Lead Channel Impedance Value: 380 Ohm
Lead Channel Impedance Value: 380 Ohm
Lead Channel Pacing Threshold Amplitude: 0.625 V
Lead Channel Pacing Threshold Amplitude: 1.75 V
Lead Channel Pacing Threshold Pulse Width: 0.4 ms
Lead Channel Pacing Threshold Pulse Width: 0.4 ms
Lead Channel Sensing Intrinsic Amplitude: 3 mV
Lead Channel Sensing Intrinsic Amplitude: 3 mV
Lead Channel Sensing Intrinsic Amplitude: 5.25 mV
Lead Channel Sensing Intrinsic Amplitude: 5.25 mV
Lead Channel Setting Pacing Amplitude: 2 V
Lead Channel Setting Pacing Amplitude: 2.5 V
Lead Channel Setting Pacing Pulse Width: 0.8 ms
Lead Channel Setting Sensing Sensitivity: 1.2 mV
Zone Setting Status: 755011
Zone Setting Status: 755011

## 2023-04-20 NOTE — Progress Notes (Signed)
Remote pacemaker transmission.   

## 2023-05-12 ENCOUNTER — Other Ambulatory Visit: Payer: Self-pay

## 2023-05-12 ENCOUNTER — Telehealth: Payer: Self-pay | Admitting: Internal Medicine

## 2023-05-12 MED ORDER — APIXABAN 5 MG PO TABS: 5.0000 mg | ORAL_TABLET | Freq: Two times a day (BID) | ORAL | 1 refills | Status: DC

## 2023-05-12 NOTE — Telephone Encounter (Signed)
Prescription refill request for Eliquis received. Indication:AFIB Last office visit:12/23 Scr:1.5  1/24 Age:77  Weight:95.7  kg  Prescription refilled

## 2023-05-12 NOTE — Telephone Encounter (Signed)
*  STAT* If patient is at the pharmacy, call can be transferred to refill team.   1. Which medications need to be refilled? (please list name of each medication and dose if known) apixaban (ELIQUIS) 5 MG TABS tablet  2. Which pharmacy/location (including street and city if local pharmacy) is medication to be sent to? [id number] - [place], [place] - [address]  3. Do they need a 30 day or 90 day supply? 90  

## 2023-06-03 ENCOUNTER — Encounter: Payer: Self-pay | Admitting: Cardiovascular Disease

## 2023-06-03 NOTE — Progress Notes (Unsigned)
Cardiology Office Note   Date:  06/05/2023   ID:  Michael Barton, DOB 05/08/1946, MRN [id number]  PCP:  Blair Heys, MD  Cardiologist:   Kristeen Miss, MD   Chief Complaint  Patient presents with   Cardiomyopathy   1. Shortness of breath 2. Hyperlipidemia 3. Obstructive sleep apnea 4. Depression    History of Present Illness: Michael Barton is a 77 y.o. male who presents for DOE Was an avid runner and cyclist until 5 years ago. Is not able to run or cycle recently - has been a steady decline  He can now run for 1 minute and then walk for 2-3   He has noticed a definint decline in his abiltiy - now gets short of breath doing everyday activities. He become short of breath climbing stairs and walking from his office out to the car.  He denies any chest pain. He has no shortness of breath at rest. He's had an echo card gram the past which reveals left ventricular hypertrophy.  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 A stress Myoview study showed no ischemia. His ejection fraction was 58%.  Michael Barton has not improved.   Has not had his echo yet.   Aug. 18, 2017: Michael Barton is seen for his dyspnea with exertion .    Myoview Mar 28, 2016 - no ischemia.  EF 58% Echo - June 2017:   Normal LV dysfunction .   Mild dynamic LV obstruction   Cardiopulmonary stress test:    Overall normal function capacity  He has been started on Coreg - has been increased to 6.25 BID   Can walk and cycle witout problems Has DOE with running    Nov. 27, 2017:  Michael Barton Is seen back for follow-up office visit. He had an echocardiogram which reveals a  Slight dynamic obstruction with LVH.   We increased his Coreg to 6.25 BID , And he has not noticed any significant improvement in his exertional dyspnea.  Feb. 26,  2018  Michael Barton is slightly worse. Has some symptoms or orthostasis.  The change from  Coreg to Metoprolol did not help  Still hiking - especially in the warmer months   March 01, 2017:   Michael Barton is seen back today  He had stopped the metoprolol prior to getting the MRI.  Still cant walk very far without getting severe shortness of breath .    Sept. 27, 2018:  Michael Barton is seen today for follow up  He has had an alcohol septal ablation at [facility] for his HOCM.  He had an episode of syncope the week after the ablation  And was found to have  complete AV block    He had a pacer placed, he did well but had a brief episode of syncope and was re-hospitalized .   Was found to have orthostasis .   Is feeling much better.   No further episodes of syncope  Is here for wound check and pacer check and to see me for follow up.    September 27, 2017  Michael Barton is doing well.  No CP or dyspnea.  No further episdoes of syncope since getting the pacer   Apr 02, 2020:  Michael Barton is seen today for follow up of his HOCM.  He is s/p alcohol ablation ([facility]) and pacer placement. His visit last year was by telemedicine.  Still is very active.  He hikes on a regular basis.  He is put in a garden ( squash, peppers, tomatoes, okra, zuchinni) .  He works out on the treadmill on a regular basis.  Has lost 6 lbs.    Aug. 29, 2022: Michael Barton is seen for follow up of his HOCM - s/p ablation at [facility]. 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  Has OSA  Has a pacer, Wants to investigate - Inspire.for OSA  Want to know if his pacer is compatable  I would also have concerns if the Earnest Bailey is MRI compatable    Sept. 6, 2023  Michael Barton is seen for follow up of his HOCM  He has been having some shortness of breath with exertion.  He is also noticed that his muscles are very stiff and sore when he wakes up in the morning.  Some days are worse than others   Scheduled [facility] the [facility]   Dec. 18, 2023 Michael Barton is seen for follow up Was having more dyspnea at his last visit in Sept,  2023 Lexiscan myoview shoed an inf. Basalar scar - likely due to the alcohol septal ablation.     Echo normal LV function.  No LVOT gradient  Has had an Inspire procedure since I last saw him  Is working well for him   He has a pacemaker that was placed after his septal ablation.  He was found to have atrial fibrillation on his device.  The patient was started on Eliquis.  Aspirin was stopped.  June 05, 2023 Michael Barton is seen for follow up of his HOCM, s/p ETOH ablation  Has been having some DOE when he is working in his garden If he does not stop to rest, he may pass out   Does not have the exertional capacity  Has some numbness / tingling in his right leg    Past Medical History:  Diagnosis Date   Depression    HOCM (hypertrophic obstructive cardiomyopathy) (HCC) 06/24/2016   cMRI 3/18: Severe asymm septal hypertrophy, EF 74, SAM, mod MR, turb flow sugg of significant LVOT gradient, picture c/w HOCM // Echo 6/17: Mild conc LVH, LVOT gradient 14, EF 65-70, mild MR // CP stress test 6/17: NL functional capacity compared to matched sedentary norms. Pt primarily limited by body habitus and deconditioning // Echo 9/12: EF 69.8, LVOT gradient 16    Hyperlipidemia    Presence of permanent cardiac pacemaker    Sleep apnea    Testicular cancer (HCC) 1990   h/o orchiectomy    Past Surgical History:  Procedure Laterality Date   DRUG INDUCED ENDOSCOPY Right 05/17/2022   Procedure: DRUG INDUCED ENDOSCOPY;  Surgeon: Christia Reading, MD;  Location: [facility];  Service: ENT;  Laterality: Right;   IMPLANTATION OF HYPOGLOSSAL NERVE STIMULATOR Right 08/01/2022   Procedure: IMPLANTATION OF HYPOGLOSSAL NERVE STIMULATOR;  Surgeon: Christia Reading, MD;  Location: [facility] OR;  Service: ENT;  Laterality: Right;  RNFA Requested   ORCHIECTOMY     PACEMAKER IMPLANT N/A 07/26/2017   Procedure: Pacemaker Implant;  Surgeon: Marinus Maw, MD;  Location: [facility] INVASIVE CV LAB;  Service: Cardiovascular;   Laterality: N/A;   RIGHT/LEFT HEART CATH AND CORONARY ANGIOGRAPHY N/A 04/10/2017   Procedure: Right/Left Heart Cath and Coronary Angiography;  Surgeon: Yvonne Kendall, MD;  Location: [facility] INVASIVE CV LAB;  Service: Cardiovascular;  Laterality: N/A;   TEMPORARY PACEMAKER N/A 07/25/2017  Procedure: Temporary Pacemaker;  Surgeon: Tonny Bollman, MD;  Location: [facility] INVASIVE CV LAB;  Service: Cardiovascular;  Laterality: N/A;     Current Outpatient Medications  Medication Sig Dispense Refill   apixaban (ELIQUIS) 5 MG TABS tablet Take 1 tablet (5 mg total) by mouth 2 (two) times daily. 180 tablet 1   buPROPion (WELLBUTRIN SR) 100 MG 12 hr tablet Take 100 mg by mouth 2 (two) times daily.     HYDROcodone-acetaminophen (NORCO/VICODIN) 5-325 MG tablet Take 1-2 tablets by mouth every 6 (six) hours as needed for moderate pain. 12 tablet 0   ibuprofen (ADVIL) 200 MG tablet Take 600 mg by mouth every 6 (six) hours as needed for moderate pain.     ivabradine (CORLANOR) 5 MG TABS tablet Take 15mg  (3 tablets) by mouth two hours prior to test 3 tablet 0   lamoTRIgine (LAMICTAL) 200 MG tablet Take 200 mg by mouth daily.     metoprolol succinate (TOPROL-XL) 100 MG 24 hr tablet Take 1 tablet by mouth daily. Take with or immediately following a meal. 90 tablet 3   pravastatin (PRAVACHOL) 80 MG tablet Take 1 tablet (80 mg total) by mouth daily. 30 tablet 1   QUEtiapine (SEROQUEL) 200 MG tablet Take 200 mg by mouth at bedtime.     Testosterone 1.62 % GEL Apply 2 Pump topically in the morning. Apply 1 pump per shoulder     venlafaxine XR (EFFEXOR-XR) 150 MG 24 hr capsule Take 150 mg by mouth daily with breakfast.     No current facility-administered medications for this visit.    Allergies:   Ambien [zolpidem tartrate] and Prozac [fluoxetine]    Social History:  The patient  reports that he has never smoked. He has never used smokeless tobacco. He reports current alcohol use. He reports that he does not use drugs.    Family History:  The patient's family history includes Cancer in his father; Depression in his mother; Thyroid disease in his mother.    ROS:  Please see the history of present illness.     All other systems are reviewed and negative.     Physical Exam: Blood pressure 116/76, pulse 83, height 6\' 3"  (1.905 m), weight 204 lb 3.2 oz (92.6 kg), SpO2 94%.       GEN:  Well nourished, well developed in no acute distress HEENT: Normal NECK: No JVD; No carotid bruits LYMPHATICS: No lymphadenopathy CARDIAC: RRR , no murmurs, rubs, gallops RESPIRATORY:  Clear to auscultation without rales, wheezing or rhonchi  ABDOMEN: Soft, non-tender, non-distended MUSCULOSKELETAL:  No edema; No deformity  SKIN: Warm and dry NEUROLOGIC:  Alert and oriented x 3     EKG:      Recent Labs: 11/14/2022: BUN 25; Creatinine, Ser 1.50; Hemoglobin 14.5; Platelets 318; Potassium 4.9; Sodium 135    Lipid Panel    Component Value Date/Time   CHOL 147 11/05/2020 0404   TRIG 112 11/05/2020 0404   HDL 45 11/05/2020 0404   CHOLHDL 3.3 11/05/2020 0404   VLDL 22 11/05/2020 0404   LDLCALC 80 11/05/2020 0404      Wt Readings from Last 3 Encounters:  06/05/23 204 lb 3.2 oz (92.6 kg)  10/24/22 211 lb (95.7 kg)  08/01/22 210 lb (95.3 kg)      Other studies Reviewed: Additional studies/ records that were reviewed today include: . Review of the above records demonstrates:    ASSESSMENT AND PLAN:  1.  Dynamic left ventricular outflow tract obstruction  S/p ETOH  ablation.    Will have him follow up with Dr. Izora Ribas upon my retirement     2. Complete heart block: -Status post pacemaker.  3.   Severe shortness of breath with exertion: Michael Barton presents with severe shortness of breath when he is out working in the garden.  He states that he will feel like he is going to pass out if he continues to work in his garden.  This may be an anginal equivalent.  I would like to get an echocardiogram for  further evaluation of his LV function and he has history of hypertrophic cardiomyopathy.  Will also get a coronary CTA for further evaluation and to rule out severe coronary artery disease.  See him again in 6 months.   4.  Sleep apnea:     5.  Atrial fibrillation:      Current medicines are reviewed at length with the patient today.  The patient does not have concerns regarding medicines.  The following changes have been made:  no change  Labs/ tests ordered today include:   Orders Placed This Encounter  Procedures   CT CORONARY MORPH W/CTA COR W/SCORE W/CA W/CM &/OR WO/CM   Basic metabolic panel   ECHOCARDIOGRAM COMPLETE     Kristeen Miss, MD  06/05/2023 8:43 PM    [facility] [address] [place], [place], [place]  [address] Phone: [phone]; Fax: [phone]

## 2023-06-05 ENCOUNTER — Encounter: Payer: Self-pay | Admitting: Cardiovascular Disease

## 2023-06-05 ENCOUNTER — Ambulatory Visit: Payer: Medicare Other | Attending: Cardiovascular Disease | Admitting: Cardiovascular Disease

## 2023-06-05 ENCOUNTER — Other Ambulatory Visit (HOSPITAL_COMMUNITY): Payer: Self-pay

## 2023-06-05 VITALS — BP 116/76 | HR 83 | Ht 75.0 in | Wt 204.2 lb

## 2023-06-05 DIAGNOSIS — I421 Obstructive hypertrophic cardiomyopathy: Secondary | ICD-10-CM | POA: Insufficient documentation

## 2023-06-05 DIAGNOSIS — E782 Mixed hyperlipidemia: Secondary | ICD-10-CM | POA: Diagnosis not present

## 2023-06-05 DIAGNOSIS — R0609 Other forms of dyspnea: Secondary | ICD-10-CM | POA: Diagnosis not present

## 2023-06-05 MED ORDER — IVABRADINE HCL 5 MG PO TABS
ORAL_TABLET | ORAL | 0 refills | Status: AC
  Filled 2023-06-05: qty 3, 1d supply, fill #0

## 2023-06-05 NOTE — Patient Instructions (Addendum)
Medication Instructions:  (Pick up Corlanor for test) Your physician recommends that you continue on your current medications as directed. Please refer to the Current Medication list given to you today. *If you need a refill on your cardiac medications before your next appointment, please call your pharmacy*  Lab Work: BMET today If you have labs (blood work) drawn today and your tests are completely normal, you will receive your results only by: MyChart Message (if you have MyChart) OR A paper copy in the mail If you have any lab test that is abnormal or we need to change your treatment, we will call you to review the results.   Testing/Procedures: ECHO Your physician has requested that you have an echocardiogram. Echocardiography is a painless test that uses sound waves to create images of your heart. It provides your doctor with information about the size and shape of your heart and how well your heart's chambers and valves are working. This procedure takes approximately one hour. There are no restrictions for this procedure. Please do NOT wear cologne, perfume, aftershave, or lotions (deodorant is allowed). Please arrive 15 minutes prior to your appointment time.  Coronary CT Angiogram Your physician has requested that you have cardiac CT. Cardiac computed tomography (CT) is a painless test that uses an x-ray machine to take clear, detailed pictures of your heart. For further information please visit [email or web address] Please follow instruction sheet as given.  Follow-Up: At [facility], you and your health needs are our priority.  As part of our continuing mission to provide you with exceptional heart care, we have created designated Provider Care Teams.  These Care Teams include your primary Cardiologist (physician) and Advanced Practice Providers (APPs -  Physician Assistants and Nurse Practitioners) who all work together to provide you with the care you need, when you need  it.  Your next appointment:   6 month(s)  Provider:   Kristeen Miss, MD       Other Instructions   Your cardiac CT will be scheduled at:   [facility] [address] [place], [place] [address] [phone]  please arrive at the [facility] Entrance (Entrance C2) of [facility] 30 minutes prior to test start time. You can use the FREE valet parking offered at entrance C (encouraged to control the heart rate for the test)  Proceed to the [facility] Radiology Department (first floor) to check-in and test prep.  All radiology patients and guests should use entrance C2 at [facility], accessed from [facility], even though the hospital's physical address listed is [address]     Please follow these instructions carefully (unless otherwise directed):  An IV will be required for this test and Nitroglycerin will be given.  Hold all erectile dysfunction medications at least 3 days (72 hrs) prior to test. (Ie viagra, cialis, sildenafil, tadalafil, etc)   On the Night Before the Test: Be sure to Drink plenty of water. Do not consume any caffeinated/decaffeinated beverages or chocolate 12 hours prior to your test. Do not take any antihistamines 12 hours prior to your test.  On the Day of the Test: Drink plenty of water until 1 hour prior to the test. Do not eat any food 1 hour prior to test. You may take your regular medications prior to the test.  Take Ivabradine (Corlanor) two hours prior to test.     After the Test: Drink plenty of water. After receiving IV contrast, you  may experience a mild flushed feeling. This is normal. On occasion, you may experience a mild rash up to 24 hours after the test. This is not dangerous. If this occurs, you can take Benadryl 25 mg and increase your fluid intake. If you experience trouble breathing, this can be serious. If it is severe call 911 IMMEDIATELY. If it is mild, please  call our office. If you take any of these medications: Glipizide/Metformin, Avandament, Glucavance, please do not take 48 hours after completing test unless otherwise instructed.  We will call to schedule your test 2-4 weeks out understanding that some insurance companies will need an authorization prior to the service being performed.   For more information and frequently asked questions, please visit our website : [email or web address]  For non-scheduling related questions, please contact the cardiac imaging nurse navigator should you have any questions/concerns: Cardiac Imaging Nurse Navigators Direct Office Dial: [phone]   For scheduling needs, including cancellations and rescheduling, please call [place], [phone].

## 2023-06-06 ENCOUNTER — Other Ambulatory Visit (HOSPITAL_COMMUNITY): Payer: Self-pay

## 2023-06-09 ENCOUNTER — Encounter (HOSPITAL_COMMUNITY): Payer: Self-pay

## 2023-06-09 ENCOUNTER — Other Ambulatory Visit (HOSPITAL_COMMUNITY): Payer: Self-pay

## 2023-06-09 ENCOUNTER — Telehealth (HOSPITAL_COMMUNITY): Payer: Self-pay | Admitting: *Deleted

## 2023-06-09 NOTE — Telephone Encounter (Signed)
Attempted to call patient regarding upcoming cardiac CT appointment. °Left message on voicemail with name and callback number ° °  RN Navigator Cardiac Imaging °[facility] °[phone] Office °[phone] Cell ° °

## 2023-06-09 NOTE — Telephone Encounter (Signed)
Reaching out to patient to offer assistance regarding upcoming cardiac imaging study; pt verbalizes understanding of appt date/time, parking situation and where to check in, pre-test NPO status and medications ordered, and verified current allergies; name and call back number provided for further questions should they arise  Larey Brick RN Navigator Cardiac Imaging [facility] [phone] office [phone] cell  Patient to take 15mg  ivabradine two hours prior to his cardiac CT scan. He is aware to arrive at [facility].

## 2023-06-12 ENCOUNTER — Ambulatory Visit (HOSPITAL_COMMUNITY)
Admission: RE | Admit: 2023-06-12 | Discharge: 2023-06-12 | Disposition: A | Discharge status: Home / Self Care | Payer: Medicare Other | Source: Ambulatory Visit | Attending: Cardiovascular Disease | Admitting: Cardiovascular Disease

## 2023-06-12 DIAGNOSIS — E782 Mixed hyperlipidemia: Secondary | ICD-10-CM | POA: Insufficient documentation

## 2023-06-12 DIAGNOSIS — R0609 Other forms of dyspnea: Secondary | ICD-10-CM | POA: Insufficient documentation

## 2023-06-12 DIAGNOSIS — I421 Obstructive hypertrophic cardiomyopathy: Secondary | ICD-10-CM | POA: Diagnosis not present

## 2023-06-12 MED ORDER — NITROGLYCERIN 0.4 MG SL SUBL
SUBLINGUAL_TABLET | SUBLINGUAL | Status: AC
  Filled 2023-06-12: qty 2

## 2023-06-12 MED ORDER — NITROGLYCERIN 0.4 MG SL SUBL
0.8000 mg | SUBLINGUAL_TABLET | SUBLINGUAL | Status: DC | PRN
  Administered 2023-06-12: 0.8 mg via SUBLINGUAL

## 2023-06-12 MED ORDER — IOHEXOL 350 MG/ML SOLN
95.0000 mL | Freq: Once | INTRAVENOUS | Status: AC | PRN
  Administered 2023-06-12: 95 mL via INTRAVENOUS

## 2023-06-21 ENCOUNTER — Ambulatory Visit: Payer: Medicare Other | Attending: Internal Medicine | Admitting: Internal Medicine

## 2023-06-21 ENCOUNTER — Encounter: Payer: Self-pay | Admitting: Internal Medicine

## 2023-06-21 VITALS — BP 136/80 | HR 82 | Ht 75.0 in | Wt 203.0 lb

## 2023-06-21 DIAGNOSIS — I480 Paroxysmal atrial fibrillation: Secondary | ICD-10-CM | POA: Diagnosis present

## 2023-06-21 DIAGNOSIS — I442 Atrioventricular block, complete: Secondary | ICD-10-CM | POA: Diagnosis not present

## 2023-06-21 LAB — CUP PACEART INCLINIC DEVICE CHECK
Battery Remaining Longevity: 100 mo
Battery Voltage: 3 V
Brady Statistic AP VP Percent: 1.89 %
Brady Statistic AP VS Percent: 60.34 %
Brady Statistic AS VP Percent: 0.47 %
Brady Statistic AS VS Percent: 37.3 %
Brady Statistic RA Percent Paced: 62.83 %
Brady Statistic RV Percent Paced: 2.4 %
Date Time Interrogation Session: 20240814155528
Implantable Lead Connection Status: 753985
Implantable Lead Connection Status: 753985
Implantable Lead Implant Date: 20180919
Implantable Lead Implant Date: 20180919
Implantable Lead Location: 753859
Implantable Lead Location: 753860
Implantable Lead Model: 5076
Implantable Lead Model: 5076
Implantable Pulse Generator Implant Date: 20180919
Lead Channel Impedance Value: 304 Ohm
Lead Channel Impedance Value: 304 Ohm
Lead Channel Impedance Value: 399 Ohm
Lead Channel Impedance Value: 437 Ohm
Lead Channel Pacing Threshold Amplitude: 0.625 V
Lead Channel Pacing Threshold Amplitude: 1.75 V
Lead Channel Pacing Threshold Pulse Width: 0.4 ms
Lead Channel Pacing Threshold Pulse Width: 0.4 ms
Lead Channel Sensing Intrinsic Amplitude: 2.125 mV
Lead Channel Sensing Intrinsic Amplitude: 2.75 mV
Lead Channel Sensing Intrinsic Amplitude: 3.125 mV
Lead Channel Sensing Intrinsic Amplitude: 4.25 mV
Lead Channel Setting Pacing Amplitude: 2 V
Lead Channel Setting Pacing Amplitude: 2.5 V
Lead Channel Setting Pacing Pulse Width: 0.8 ms
Lead Channel Setting Sensing Sensitivity: 1.2 mV
Zone Setting Status: 755011
Zone Setting Status: 755011

## 2023-06-21 NOTE — Patient Instructions (Signed)

## 2023-06-21 NOTE — Progress Notes (Signed)
HPI [name] returns today for followup. He is a pleasant 77 yo man with a h/o HCM, s/p ETOH septal ablation complicated by CHB. He underwent PPM insertion. In the interim, he has started back exercising and will walk for up to an hour at rates of over 4 mph. He denies chest pain. No syncope. No edema.  He admits to some dyspnea when he is jogging in hot weather and has stopped this. Allergies  Allergen Reactions   Ambien [Zolpidem Tartrate] Other (See Comments)    GERD   Prozac [Fluoxetine] Other (See Comments)    "makes me feel weird"     Current Outpatient Medications  Medication Sig Dispense Refill   apixaban (ELIQUIS) 5 MG TABS tablet Take 1 tablet (5 mg total) by mouth 2 (two) times daily. 180 tablet 1   buPROPion (WELLBUTRIN SR) 100 MG 12 hr tablet Take 100 mg by mouth 2 (two) times daily.     HYDROcodone-acetaminophen (NORCO/VICODIN) 5-325 MG tablet Take 1-2 tablets by mouth every 6 (six) hours as needed for moderate pain. 12 tablet 0   ibuprofen (ADVIL) 200 MG tablet Take 600 mg by mouth every 6 (six) hours as needed for moderate pain.     ivabradine (CORLANOR) 5 MG TABS tablet Take 15mg  (3 tablets) by mouth two hours prior to test 3 tablet 0   lamoTRIgine (LAMICTAL) 200 MG tablet Take 200 mg by mouth daily.     metoprolol succinate (TOPROL-XL) 100 MG 24 hr tablet Take 1 tablet by mouth daily. Take with or immediately following a meal. 90 tablet 3   pravastatin (PRAVACHOL) 80 MG tablet Take 1 tablet (80 mg total) by mouth daily. 30 tablet 1   QUEtiapine (SEROQUEL) 200 MG tablet Take 200 mg by mouth at bedtime.     Testosterone 1.62 % GEL Apply 2 Pump topically in the morning. Apply 1 pump per shoulder     venlafaxine XR (EFFEXOR-XR) 150 MG 24 hr capsule Take 150 mg by mouth daily with breakfast.     No current facility-administered medications for this visit.     Past Medical History:  Diagnosis Date   Depression    HOCM (hypertrophic obstructive  cardiomyopathy) (HCC) 06/24/2016   cMRI 3/18: Severe asymm septal hypertrophy, EF 74, SAM, mod MR, turb flow sugg of significant LVOT gradient, picture c/w HOCM // Echo 6/17: Mild conc LVH, LVOT gradient 14, EF 65-70, mild MR // CP stress test 6/17: NL functional capacity compared to matched sedentary norms. Pt primarily limited by body habitus and deconditioning // Echo 9/12: EF 69.8, LVOT gradient 16    Hyperlipidemia    Presence of permanent cardiac pacemaker    Sleep apnea    Testicular cancer (HCC) 1990   h/o orchiectomy    ROS:   All systems reviewed and negative except as noted in the HPI.   Past Surgical History:  Procedure Laterality Date   DRUG INDUCED ENDOSCOPY Right 05/17/2022   Procedure: DRUG INDUCED ENDOSCOPY;  Surgeon: Christia Reading, MD;  Location: [facility];  Service: ENT;  Laterality: Right;   IMPLANTATION OF HYPOGLOSSAL NERVE STIMULATOR Right 08/01/2022   Procedure: IMPLANTATION OF HYPOGLOSSAL NERVE STIMULATOR;  Surgeon: Christia Reading, MD;  Location: [facility] OR;  Service: ENT;  Laterality: Right;  RNFA Requested   ORCHIECTOMY     PACEMAKER IMPLANT N/A 07/26/2017   Procedure: Pacemaker Implant;  Surgeon: Marinus Maw, MD;  Location: [facility];  Service: Cardiovascular;  Laterality:  N/A;   RIGHT/LEFT HEART CATH AND CORONARY ANGIOGRAPHY N/A 04/10/2017   Procedure: Right/Left Heart Cath and Coronary Angiography;  Surgeon: Yvonne Kendall, MD;  Location: [facility] INVASIVE CV LAB;  Service: Cardiovascular;  Laterality: N/A;   TEMPORARY PACEMAKER N/A 07/25/2017   Procedure: Temporary Pacemaker;  Surgeon: Tonny Bollman, MD;  Location: [facility] INVASIVE CV LAB;  Service: Cardiovascular;  Laterality: N/A;     Family History  Problem Relation Age of Onset   Thyroid disease Mother    Depression Mother    Cancer Father    Stroke Neg Hx      Social History   Socioeconomic History   Marital status: Single    Spouse name: Not on file   Number of children: Not on  file   Years of education: Not on file   Highest education level: Not on file  Occupational History   Occupation: Scientist, research (medical)  Tobacco Use   Smoking status: Never   Smokeless tobacco: Never  Vaping Use   Vaping status: Never Used  Substance and Sexual Activity   Alcohol use: Yes    Comment: minimal, maybe 1 a week   Drug use: No   Sexual activity: Not on file  Other Topics Concern   Not on file  Social History Narrative   ** Merged History Encounter **       Social Determinants of Health   Financial Resource Strain: Not on file  Food Insecurity: Not on file  Transportation Needs: Not on file  Physical Activity: Not on file  Stress: Not on file  Social Connections: Not on file  Intimate Partner Violence: Not on file     BP 136/80   Pulse 82   Ht 6\' 3"  (1.905 m)   Wt 203 lb (92.1 kg)   SpO2 97%   BMI 25.37 kg/m   Physical Exam:  Well appearing NAD HEENT: Unremarkable Neck:  No JVD, no thyromegally Lymphatics:  No adenopathy Back:  No CVA tenderness Lungs:  Clear HEART:  Regular rate rhythm, no murmurs, no rubs, no clicks Abd:  soft, positive bowel sounds, no organomegally, no rebound, no guarding Ext:  2 plus pulses, no edema, no cyanosis, no clubbing Skin:  No rashes no nodules Neuro:  CN II through XII intact, motor grossly intact  EKG - NSR with atrial pacing and RBBB  DEVICE  Normal device function.  See PaceArt for details.   Assess/Plan:  1. CHB - he is pacing minimally in the ventricle. He will continue his current meds. 2. PPM - his medtronic DDD PPM is working normally.  3. HCM - he is s/p ETOH septal ablation and is asymptomatic. No change. 4. HTN - his bp is well controlled. No change in meds. I have encouraged him to increase his activity. 5. PAF - he will continue eliquis.    Lewayne Bunting, M.D

## 2023-06-26 ENCOUNTER — Ambulatory Visit (HOSPITAL_COMMUNITY): Payer: Medicare Other | Attending: Cardiology

## 2023-06-26 DIAGNOSIS — I421 Obstructive hypertrophic cardiomyopathy: Secondary | ICD-10-CM | POA: Diagnosis not present

## 2023-06-26 DIAGNOSIS — E782 Mixed hyperlipidemia: Secondary | ICD-10-CM | POA: Diagnosis not present

## 2023-06-26 DIAGNOSIS — R0609 Other forms of dyspnea: Secondary | ICD-10-CM | POA: Diagnosis not present

## 2023-06-26 LAB — ECHOCARDIOGRAM COMPLETE
Area-P 1/2: 2.73 cm2
S' Lateral: 3.6 cm

## 2023-06-27 ENCOUNTER — Ambulatory Visit: Payer: Medicare Other

## 2023-06-27 DIAGNOSIS — I442 Atrioventricular block, complete: Secondary | ICD-10-CM

## 2023-06-27 LAB — CUP PACEART REMOTE DEVICE CHECK
Battery Remaining Longevity: 99 mo
Battery Voltage: 3 V
Brady Statistic AP VP Percent: 3.33 %
Brady Statistic AP VS Percent: 62.15 %
Brady Statistic AS VP Percent: 1.18 %
Brady Statistic AS VS Percent: 33.34 %
Brady Statistic RA Percent Paced: 65.93 %
Brady Statistic RV Percent Paced: 4.53 %
Date Time Interrogation Session: 20240820072845
Implantable Lead Connection Status: 753985
Implantable Lead Connection Status: 753985
Implantable Lead Implant Date: 20180919
Implantable Lead Implant Date: 20180919
Implantable Lead Location: 753859
Implantable Lead Location: 753860
Implantable Lead Model: 5076
Implantable Lead Model: 5076
Implantable Pulse Generator Implant Date: 20180919
Lead Channel Impedance Value: 285 Ohm
Lead Channel Impedance Value: 323 Ohm
Lead Channel Impedance Value: 380 Ohm
Lead Channel Impedance Value: 399 Ohm
Lead Channel Pacing Threshold Amplitude: 0.625 V
Lead Channel Pacing Threshold Amplitude: 1.625 V
Lead Channel Pacing Threshold Pulse Width: 0.4 ms
Lead Channel Pacing Threshold Pulse Width: 0.4 ms
Lead Channel Sensing Intrinsic Amplitude: 2.25 mV
Lead Channel Sensing Intrinsic Amplitude: 2.25 mV
Lead Channel Sensing Intrinsic Amplitude: 2.625 mV
Lead Channel Sensing Intrinsic Amplitude: 2.625 mV
Lead Channel Setting Pacing Amplitude: 2 V
Lead Channel Setting Pacing Amplitude: 2.5 V
Lead Channel Setting Pacing Pulse Width: 0.8 ms
Lead Channel Setting Sensing Sensitivity: 1.2 mV
Zone Setting Status: 755011
Zone Setting Status: 755011

## 2023-07-07 NOTE — Progress Notes (Signed)
Remote pacemaker transmission.   

## 2023-07-31 ENCOUNTER — Other Ambulatory Visit: Payer: Self-pay | Admitting: Cardiovascular Disease

## 2023-08-01 DIAGNOSIS — F3170 Bipolar disorder, currently in remission, most recent episode unspecified: Secondary | ICD-10-CM | POA: Diagnosis not present

## 2023-08-01 DIAGNOSIS — Z79899 Other long term (current) drug therapy: Secondary | ICD-10-CM | POA: Diagnosis not present

## 2023-09-01 DIAGNOSIS — R948 Abnormal results of function studies of other organs and systems: Secondary | ICD-10-CM | POA: Diagnosis not present

## 2023-09-01 DIAGNOSIS — E291 Testicular hypofunction: Secondary | ICD-10-CM | POA: Diagnosis not present

## 2023-09-20 DIAGNOSIS — E7800 Pure hypercholesterolemia, unspecified: Secondary | ICD-10-CM | POA: Diagnosis not present

## 2023-09-20 DIAGNOSIS — N183 Chronic kidney disease, stage 3 unspecified: Secondary | ICD-10-CM | POA: Diagnosis not present

## 2023-09-21 DIAGNOSIS — E222 Syndrome of inappropriate secretion of antidiuretic hormone: Secondary | ICD-10-CM | POA: Diagnosis not present

## 2023-09-21 DIAGNOSIS — N183 Chronic kidney disease, stage 3 unspecified: Secondary | ICD-10-CM | POA: Diagnosis not present

## 2023-09-21 DIAGNOSIS — G4733 Obstructive sleep apnea (adult) (pediatric): Secondary | ICD-10-CM | POA: Diagnosis not present

## 2023-09-21 DIAGNOSIS — Z950 Presence of cardiac pacemaker: Secondary | ICD-10-CM | POA: Diagnosis not present

## 2023-09-21 DIAGNOSIS — E871 Hypo-osmolality and hyponatremia: Secondary | ICD-10-CM | POA: Diagnosis not present

## 2023-09-21 DIAGNOSIS — I495 Sick sinus syndrome: Secondary | ICD-10-CM | POA: Diagnosis not present

## 2023-09-21 DIAGNOSIS — I421 Obstructive hypertrophic cardiomyopathy: Secondary | ICD-10-CM | POA: Diagnosis not present

## 2023-09-21 DIAGNOSIS — F322 Major depressive disorder, single episode, severe without psychotic features: Secondary | ICD-10-CM | POA: Diagnosis not present

## 2023-09-21 DIAGNOSIS — E7800 Pure hypercholesterolemia, unspecified: Secondary | ICD-10-CM | POA: Diagnosis not present

## 2023-09-21 DIAGNOSIS — Z0000 Encounter for general adult medical examination without abnormal findings: Secondary | ICD-10-CM | POA: Diagnosis not present

## 2023-09-21 DIAGNOSIS — Z23 Encounter for immunization: Secondary | ICD-10-CM | POA: Diagnosis not present

## 2023-09-21 DIAGNOSIS — E291 Testicular hypofunction: Secondary | ICD-10-CM | POA: Diagnosis not present

## 2023-09-22 DIAGNOSIS — N183 Chronic kidney disease, stage 3 unspecified: Secondary | ICD-10-CM | POA: Diagnosis not present

## 2023-09-26 ENCOUNTER — Ambulatory Visit (INDEPENDENT_AMBULATORY_CARE_PROVIDER_SITE_OTHER): Payer: Medicare Other

## 2023-09-26 DIAGNOSIS — I442 Atrioventricular block, complete: Secondary | ICD-10-CM

## 2023-09-27 LAB — CUP PACEART REMOTE DEVICE CHECK
Battery Remaining Longevity: 94 mo
Battery Voltage: 2.99 V
Brady Statistic AP VP Percent: 3.36 %
Brady Statistic AP VS Percent: 74.9 %
Brady Statistic AS VP Percent: 0.68 %
Brady Statistic AS VS Percent: 21.06 %
Brady Statistic RA Percent Paced: 78.98 %
Brady Statistic RV Percent Paced: 4.06 %
Date Time Interrogation Session: 20241119200111
Implantable Lead Connection Status: 753985
Implantable Lead Connection Status: 753985
Implantable Lead Implant Date: 20180919
Implantable Lead Implant Date: 20180919
Implantable Lead Location: 753859
Implantable Lead Location: 753860
Implantable Lead Model: 5076
Implantable Lead Model: 5076
Implantable Pulse Generator Implant Date: 20180919
Lead Channel Impedance Value: 285 Ohm
Lead Channel Impedance Value: 304 Ohm
Lead Channel Impedance Value: 342 Ohm
Lead Channel Impedance Value: 380 Ohm
Lead Channel Pacing Threshold Amplitude: 0.5 V
Lead Channel Pacing Threshold Amplitude: 1.75 V
Lead Channel Pacing Threshold Pulse Width: 0.4 ms
Lead Channel Pacing Threshold Pulse Width: 0.4 ms
Lead Channel Sensing Intrinsic Amplitude: 3 mV
Lead Channel Sensing Intrinsic Amplitude: 3 mV
Lead Channel Sensing Intrinsic Amplitude: 3.625 mV
Lead Channel Sensing Intrinsic Amplitude: 3.625 mV
Lead Channel Setting Pacing Amplitude: 2 V
Lead Channel Setting Pacing Amplitude: 2.5 V
Lead Channel Setting Pacing Pulse Width: 0.8 ms
Lead Channel Setting Sensing Sensitivity: 1.2 mV
Zone Setting Status: 755011
Zone Setting Status: 755011

## 2023-10-23 NOTE — Addendum Note (Signed)
Addended by: Geralyn Flash D on: 10/23/2023 01:38 PM   Modules accepted: Orders

## 2023-10-23 NOTE — Progress Notes (Signed)
Remote pacemaker transmission.   

## 2023-11-16 ENCOUNTER — Other Ambulatory Visit: Payer: Self-pay | Admitting: Cardiovascular Disease

## 2023-11-17 NOTE — Telephone Encounter (Signed)
 Prescription refill request for Eliquis received. Indication:afib Last office visit:8/24 Scr:1.47  7/24 Age: 78 Weight:92.1  kg  Prescription refilled

## 2023-12-14 DIAGNOSIS — L821 Other seborrheic keratosis: Secondary | ICD-10-CM | POA: Diagnosis not present

## 2023-12-14 DIAGNOSIS — L918 Other hypertrophic disorders of the skin: Secondary | ICD-10-CM | POA: Diagnosis not present

## 2023-12-14 DIAGNOSIS — D485 Neoplasm of uncertain behavior of skin: Secondary | ICD-10-CM | POA: Diagnosis not present

## 2023-12-26 ENCOUNTER — Ambulatory Visit (INDEPENDENT_AMBULATORY_CARE_PROVIDER_SITE_OTHER): Payer: Medicare Other

## 2023-12-26 DIAGNOSIS — I442 Atrioventricular block, complete: Secondary | ICD-10-CM

## 2023-12-26 LAB — CUP PACEART REMOTE DEVICE CHECK
Battery Remaining Longevity: 94 mo
Battery Voltage: 2.99 V
Brady Statistic AP VP Percent: 5.83 %
Brady Statistic AP VS Percent: 50.06 %
Brady Statistic AS VP Percent: 3.18 %
Brady Statistic AS VS Percent: 40.93 %
Brady Statistic RA Percent Paced: 56.8 %
Brady Statistic RV Percent Paced: 9.03 %
Date Time Interrogation Session: 20250217204356
Implantable Lead Connection Status: 753985
Implantable Lead Connection Status: 753985
Implantable Lead Implant Date: 20180919
Implantable Lead Implant Date: 20180919
Implantable Lead Location: 753859
Implantable Lead Location: 753860
Implantable Lead Model: 5076
Implantable Lead Model: 5076
Implantable Pulse Generator Implant Date: 20180919
Lead Channel Impedance Value: 304 Ohm
Lead Channel Impedance Value: 342 Ohm
Lead Channel Impedance Value: 418 Ohm
Lead Channel Impedance Value: 418 Ohm
Lead Channel Pacing Threshold Amplitude: 0.5 V
Lead Channel Pacing Threshold Amplitude: 1.75 V
Lead Channel Pacing Threshold Pulse Width: 0.4 ms
Lead Channel Pacing Threshold Pulse Width: 0.4 ms
Lead Channel Sensing Intrinsic Amplitude: 3.125 mV
Lead Channel Sensing Intrinsic Amplitude: 3.125 mV
Lead Channel Sensing Intrinsic Amplitude: 4.625 mV
Lead Channel Sensing Intrinsic Amplitude: 4.625 mV
Lead Channel Setting Pacing Amplitude: 2 V
Lead Channel Setting Pacing Amplitude: 2.5 V
Lead Channel Setting Pacing Pulse Width: 0.8 ms
Lead Channel Setting Sensing Sensitivity: 1.2 mV
Zone Setting Status: 755011
Zone Setting Status: 755011

## 2023-12-27 ENCOUNTER — Encounter: Payer: Self-pay | Admitting: Internal Medicine

## 2024-01-25 DIAGNOSIS — N183 Chronic kidney disease, stage 3 unspecified: Secondary | ICD-10-CM | POA: Diagnosis not present

## 2024-02-01 ENCOUNTER — Encounter: Payer: Self-pay | Admitting: Cardiovascular Disease

## 2024-02-01 NOTE — Progress Notes (Signed)
 Remote pacemaker transmission.

## 2024-02-01 NOTE — Progress Notes (Unsigned)
 Cardiology Office Note   Date:  02/02/2024   ID:  Michael Barton, DOB [birth year], MRN [id number]  PCP:  Irven Coe, MD  Cardiologist:   Kristeen Miss, MD   Chief Complaint  Patient presents with   Cardiomyopathy   1. Shortness of breath 2. Hyperlipidemia 3. Obstructive sleep apnea 4. Depression    History of Present Illness: Michael Barton is a 78 y.o. male who presents for DOE Was an avid runner and cyclist until 5 years ago. Is not able to run or cycle recently - has been a steady decline  He can now run for 1 minute and then walk for 2-3   He has noticed a definint decline in his abiltiy - now gets short of breath doing everyday activities. He become short of breath climbing stairs and walking from his office out to the car.  He denies any chest pain. He has no shortness of breath at rest. He's had an echo card gram the past which reveals left ventricular hypertrophy.  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 A stress Myoview study showed no ischemia. His ejection fraction was 58%.  [name] has not improved.   Has not had his echo yet.   Aug. 18, 2017: [name] is seen for his dyspnea with exertion .    Myoview Mar 28, 2016 - no ischemia.  EF 58% Echo - June 2017:   Normal LV dysfunction .   Mild dynamic LV obstruction   Cardiopulmonary stress test:    Overall normal function capacity  He has been started on Coreg - has been increased to 6.25 BID   Can walk and cycle witout problems Has DOE with running    Nov. 27, 2017:  [name] Is seen back for follow-up office visit. He had an echocardiogram which reveals a  Slight dynamic obstruction with LVH.   We increased his Coreg to 6.25 BID , And he has not noticed any significant improvement in his exertional dyspnea.  Feb. 26,  2018  [name] is slightly worse. Has some symptoms or orthostasis.  The change from Coreg to  Metoprolol did not help  Still hiking - especially in the warmer months   March 01, 2017:   Michael Barton is seen back today  He had stopped the metoprolol prior to getting the MRI.  Still cant walk very far without getting severe shortness of breath .    Sept. 27, 2018:  [name] is seen today for follow up  He has had an alcohol septal ablation at [facility] for his HOCM.  He had an episode of syncope the week after the ablation  And was found to have  complete AV block    He had a pacer placed, he did well but had a brief episode of syncope and was re-hospitalized .   Was found to have orthostasis .   Is feeling much better.   No further episodes of syncope  Is here for wound check and pacer check and to see me for follow up.    September 27, 2017  Michael Barton is doing well.  No CP or dyspnea.  No further episdoes of syncope since getting the pacer   Apr 02, 2020:  Michael Barton is seen today for follow up of his HOCM.  He is s/p alcohol ablation ([facility]) and pacer placement. His visit last year was by telemedicine.  Still is very active.  He hikes on a regular basis.  He is put in a garden ( squash, peppers, tomatoes, okra, zuchinni) .  He works out on the treadmill on a regular basis.  Has lost 6 lbs.    Aug. 29, 2022: [name] is seen for follow up of his HOCM - s/p ablation at [facility]. 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  Has OSA  Has a pacer, Wants to investigate - Inspire.for OSA  Want to know if his pacer is compatable  I would also have concerns if the Earnest Bailey is MRI compatable    Sept. 6, 2023  [name] is seen for follow up of his HOCM  He has been having some shortness of breath with exertion.  He is also noticed that his muscles are very stiff and sore when he wakes up in the morning.  Some days are worse than others   Scheduled for the [facility]   Dec. 18, 2023 Michael Barton is seen for follow up Was having more dyspnea at his last visit in Sept,  2023 Lexiscan myoview shoed an inf. Basalar scar - likely due to the alcohol septal ablation.     Echo normal LV function.  No LVOT gradient  Has had an Inspire procedure since I last saw him  Is working well for him   He has a pacemaker that was placed after his septal ablation.  He was found to have atrial fibrillation on his device.  The patient was started on Eliquis.  Aspirin was stopped.  June 05, 2023 [name] is seen for follow up of his HOCM, s/p ETOH ablation  Has been having some DOE when he is working in his garden If he does not stop to rest, he may pass out   Does not have the exertional capacity  Has some numbness / tingling in his right leg    February 02, 2024 Michael Barton is seen for follow up of hi HOCM, s/p ETOH ablation Required a pacer after his ablation  He was having some generalized weakness and shortness of breath last year.  Echocardiogram from June 26, 2023 reveals hyperdynamic left ventricular systolic function.  He has grade 1 diastolic dysfunction.  Normal PA pressures.  No significant valvular abnormalities.  Is back on his exercise regimin Walks on the treadmill and rides stationary bike without any difficulties      Past Medical History:  Diagnosis Date   Depression    HOCM (hypertrophic obstructive cardiomyopathy) (HCC) 06/24/2016   cMRI 3/18: Severe asymm septal hypertrophy, EF 74, SAM, mod MR, turb flow sugg of significant LVOT gradient, picture c/w HOCM // Echo 6/17: Mild conc LVH, LVOT gradient 14, EF 65-70, mild MR // CP stress test 6/17: NL functional capacity compared to matched sedentary norms. Pt primarily limited by body habitus and deconditioning // Echo 9/12: EF 69.8, LVOT gradient 16    Hyperlipidemia    Presence of permanent cardiac pacemaker    Sleep apnea    Testicular cancer (HCC) 1990   h/o orchiectomy    Past Surgical History:  Procedure Laterality Date   DRUG INDUCED ENDOSCOPY Right 05/17/2022   Procedure: DRUG INDUCED ENDOSCOPY;   Surgeon: Christia Reading, MD;  Location: [facility];  Service: ENT;  Laterality: Right;   IMPLANTATION OF HYPOGLOSSAL NERVE STIMULATOR Right 08/01/2022   Procedure: IMPLANTATION OF HYPOGLOSSAL NERVE STIMULATOR;  Surgeon: Christia Reading, MD;  Location: [facility] OR;  Service:  ENT;  Laterality: Right;  RNFA Requested   ORCHIECTOMY     PACEMAKER IMPLANT N/A 07/26/2017   Procedure: Pacemaker Implant;  Surgeon: Marinus Maw, MD;  Location: [facility] INVASIVE CV LAB;  Service: Cardiovascular;  Laterality: N/A;   RIGHT/LEFT HEART CATH AND CORONARY ANGIOGRAPHY N/A 04/10/2017   Procedure: Right/Left Heart Cath and Coronary Angiography;  Surgeon: Yvonne Kendall, MD;  Location: [facility] INVASIVE CV LAB;  Service: Cardiovascular;  Laterality: N/A;   TEMPORARY PACEMAKER N/A 07/25/2017   Procedure: Temporary Pacemaker;  Surgeon: Tonny Bollman, MD;  Location: [facility] INVASIVE CV LAB;  Service: Cardiovascular;  Laterality: N/A;     Current Outpatient Medications  Medication Sig Dispense Refill   apixaban (ELIQUIS) 5 MG TABS tablet TAKE ONE TABLET BY MOUTH TWICE DAILY 180 tablet 1   buPROPion (WELLBUTRIN SR) 100 MG 12 hr tablet Take 100 mg by mouth 2 (two) times daily.     diazepam (VALIUM) 10 MG tablet Take 10 mg by mouth daily as needed.     ibuprofen (ADVIL) 200 MG tablet Take 600 mg by mouth every 6 (six) hours as needed for moderate pain.     ivabradine (CORLANOR) 5 MG TABS tablet Take 15mg  (3 tablets) by mouth two hours prior to test 3 tablet 0   lamoTRIgine (LAMICTAL) 200 MG tablet Take 200 mg by mouth daily.     metoprolol succinate (TOPROL-XL) 100 MG 24 hr tablet Take 1 tablet (100 mg total) by mouth daily. 90 tablet 3   pravastatin (PRAVACHOL) 80 MG tablet Take 1 tablet (80 mg total) by mouth daily. 30 tablet 1   QUEtiapine (SEROQUEL) 200 MG tablet Take 200 mg by mouth at bedtime.     tamsulosin (FLOMAX) 0.4 MG CAPS capsule Take 0.4 mg by mouth daily.     Testosterone 1.62 % GEL Apply 2 Pump topically in the  morning. Apply 1 pump per shoulder     triamcinolone cream (KENALOG) 0.1 % Apply 1 Application topically.     venlafaxine XR (EFFEXOR-XR) 150 MG 24 hr capsule Take 150 mg by mouth daily with breakfast.     No current facility-administered medications for this visit.    Allergies:   Ambien [zolpidem tartrate] and Prozac [fluoxetine]    Social History:  The patient  reports that he has never smoked. He has never used smokeless tobacco. He reports current alcohol use. He reports that he does not use drugs.   Family History:  The patient's family history includes Cancer in his father; Depression in his mother; Thyroid disease in his mother.    ROS:  Please see the history of present illness.     All other systems are reviewed and negative.     Physical Exam: Blood pressure 120/74, pulse 62, height 6\' 3"  (1.905 m), weight 201 lb 6.4 oz (91.4 kg), SpO2 97%.      GEN:  Well nourished, well developed in no acute distress HEENT: Normal NECK: No JVD; No carotid bruits LYMPHATICS: No lymphadenopathy CARDIAC: RRR   RESPIRATORY:  Clear to auscultation without rales, wheezing or rhonchi  ABDOMEN: Soft, non-tender, non-distended MUSCULOSKELETAL:  No edema; No deformity  SKIN: Warm and dry NEUROLOGIC:  Alert and oriented x 3     EKG:      Recent Labs: 06/05/2023: BUN 20; Creatinine, Ser 1.47; Potassium 4.6; Sodium 141    Lipid Panel    Component Value Date/Time   CHOL 147 11/05/2020 0404   TRIG 112 11/05/2020 0404   HDL 45 11/05/2020 0404  CHOLHDL 3.3 11/05/2020 0404   VLDL 22 11/05/2020 0404   LDLCALC 80 11/05/2020 0404      Wt Readings from Last 3 Encounters:  02/02/24 201 lb 6.4 oz (91.4 kg)  06/21/23 203 lb (92.1 kg)  06/05/23 204 lb 3.2 oz (92.6 kg)      Other studies Reviewed: Additional studies/ records that were reviewed today include: . Review of the above records demonstrates:    ASSESSMENT AND PLAN:  1.  Dynamic left ventricular outflow tract  obstruction  sp alcohol septal ablation at [facility].   Seems to be doing well.   2. Complete heart block: -Status post pacemaker.  3.   Severe shortness of breath with exertion:  seems to be better .  Cont current meds, current plans    4.  Sleep apnea:     5.  Atrial fibrillation:    Continue eliquis    Current medicines are reviewed at length with the patient today.  The patient does not have concerns regarding medicines.  The following changes have been made:  no change  Labs/ tests ordered today include:   No orders of the defined types were placed in this encounter.    Kristeen Miss, MD  02/02/2024 2:12 PM    [facility] [address] [place], [place], [place]  [address] Phone: [phone]; Fax: [phone]

## 2024-02-02 ENCOUNTER — Encounter: Payer: Self-pay | Admitting: Cardiovascular Disease

## 2024-02-02 ENCOUNTER — Ambulatory Visit: Attending: Cardiovascular Disease | Admitting: Cardiovascular Disease

## 2024-02-02 VITALS — BP 120/74 | HR 62 | Ht 75.0 in | Wt 201.4 lb

## 2024-02-02 DIAGNOSIS — I421 Obstructive hypertrophic cardiomyopathy: Secondary | ICD-10-CM | POA: Insufficient documentation

## 2024-02-02 DIAGNOSIS — E782 Mixed hyperlipidemia: Secondary | ICD-10-CM | POA: Diagnosis not present

## 2024-02-02 NOTE — Patient Instructions (Signed)
 Follow-Up: At [facility], you and your health needs are our priority.  As part of our continuing mission to provide you with exceptional heart care, our providers are all part of one team.  This team includes your primary Cardiologist (physician) and Advanced Practice Providers or APPs (Physician Assistants and Nurse Practitioners) who all work together to provide you with the care you need, when you need it.  Your next appointment:   1 year(s)  Provider:   Riley Lam, MD    1st Floor: - Lobby - Registration  - Pharmacy  - Lab - Cafe  2nd Floor: - PV Lab - Diagnostic Testing (echo, CT, nuclear med)  3rd Floor: - Vacant  4th Floor: - TCTS (cardiothoracic surgery) - AFib Clinic - Structural Heart Clinic - Vascular Surgery  - Vascular Ultrasound  5th Floor: - HeartCare Cardiology (general and EP) - Clinical Pharmacy for coumadin, hypertension, lipid, weight-loss medications, and med management appointments    Valet parking services will be available as well.

## 2024-02-05 DIAGNOSIS — N183 Chronic kidney disease, stage 3 unspecified: Secondary | ICD-10-CM | POA: Diagnosis not present

## 2024-02-09 DIAGNOSIS — H5319 Other subjective visual disturbances: Secondary | ICD-10-CM | POA: Diagnosis not present

## 2024-02-09 DIAGNOSIS — H5213 Myopia, bilateral: Secondary | ICD-10-CM | POA: Diagnosis not present

## 2024-02-15 DIAGNOSIS — L821 Other seborrheic keratosis: Secondary | ICD-10-CM | POA: Diagnosis not present

## 2024-02-29 DIAGNOSIS — L918 Other hypertrophic disorders of the skin: Secondary | ICD-10-CM | POA: Diagnosis not present

## 2024-03-06 DIAGNOSIS — F322 Major depressive disorder, single episode, severe without psychotic features: Secondary | ICD-10-CM | POA: Diagnosis not present

## 2024-03-06 DIAGNOSIS — N183 Chronic kidney disease, stage 3 unspecified: Secondary | ICD-10-CM | POA: Diagnosis not present

## 2024-03-06 DIAGNOSIS — E7800 Pure hypercholesterolemia, unspecified: Secondary | ICD-10-CM | POA: Diagnosis not present

## 2024-03-08 DIAGNOSIS — F3170 Bipolar disorder, currently in remission, most recent episode unspecified: Secondary | ICD-10-CM | POA: Diagnosis not present

## 2024-03-12 DIAGNOSIS — L821 Other seborrheic keratosis: Secondary | ICD-10-CM | POA: Diagnosis not present

## 2024-04-02 ENCOUNTER — Ambulatory Visit

## 2024-04-02 DIAGNOSIS — N183 Chronic kidney disease, stage 3 unspecified: Secondary | ICD-10-CM | POA: Diagnosis not present

## 2024-04-02 DIAGNOSIS — I442 Atrioventricular block, complete: Secondary | ICD-10-CM | POA: Diagnosis not present

## 2024-04-03 LAB — CUP PACEART REMOTE DEVICE CHECK
Battery Remaining Longevity: 89 mo
Battery Voltage: 2.98 V
Brady Statistic AP VP Percent: 9.12 %
Brady Statistic AP VS Percent: 57.56 %
Brady Statistic AS VP Percent: 4.17 %
Brady Statistic AS VS Percent: 29.16 %
Brady Statistic RA Percent Paced: 67.28 %
Brady Statistic RV Percent Paced: 13.32 %
Date Time Interrogation Session: 20250527224129
Implantable Lead Connection Status: 753985
Implantable Lead Connection Status: 753985
Implantable Lead Implant Date: 20180919
Implantable Lead Implant Date: 20180919
Implantable Lead Location: 753859
Implantable Lead Location: 753860
Implantable Lead Model: 5076
Implantable Lead Model: 5076
Implantable Pulse Generator Implant Date: 20180919
Lead Channel Impedance Value: 285 Ohm
Lead Channel Impedance Value: 304 Ohm
Lead Channel Impedance Value: 361 Ohm
Lead Channel Impedance Value: 399 Ohm
Lead Channel Pacing Threshold Amplitude: 0.5 V
Lead Channel Pacing Threshold Amplitude: 1.625 V
Lead Channel Pacing Threshold Pulse Width: 0.4 ms
Lead Channel Pacing Threshold Pulse Width: 0.4 ms
Lead Channel Sensing Intrinsic Amplitude: 2.75 mV
Lead Channel Sensing Intrinsic Amplitude: 2.75 mV
Lead Channel Sensing Intrinsic Amplitude: 3 mV
Lead Channel Sensing Intrinsic Amplitude: 3 mV
Lead Channel Setting Pacing Amplitude: 2 V
Lead Channel Setting Pacing Amplitude: 2.5 V
Lead Channel Setting Pacing Pulse Width: 0.8 ms
Lead Channel Setting Sensing Sensitivity: 1.2 mV
Zone Setting Status: 755011
Zone Setting Status: 755011

## 2024-04-05 ENCOUNTER — Ambulatory Visit: Payer: Self-pay | Admitting: Internal Medicine

## 2024-04-06 DIAGNOSIS — N183 Chronic kidney disease, stage 3 unspecified: Secondary | ICD-10-CM | POA: Diagnosis not present

## 2024-04-06 DIAGNOSIS — E7800 Pure hypercholesterolemia, unspecified: Secondary | ICD-10-CM | POA: Diagnosis not present

## 2024-04-06 DIAGNOSIS — F322 Major depressive disorder, single episode, severe without psychotic features: Secondary | ICD-10-CM | POA: Diagnosis not present

## 2024-05-02 DIAGNOSIS — N183 Chronic kidney disease, stage 3 unspecified: Secondary | ICD-10-CM | POA: Diagnosis not present

## 2024-05-06 DIAGNOSIS — F322 Major depressive disorder, single episode, severe without psychotic features: Secondary | ICD-10-CM | POA: Diagnosis not present

## 2024-05-06 DIAGNOSIS — E7800 Pure hypercholesterolemia, unspecified: Secondary | ICD-10-CM | POA: Diagnosis not present

## 2024-05-06 DIAGNOSIS — N183 Chronic kidney disease, stage 3 unspecified: Secondary | ICD-10-CM | POA: Diagnosis not present

## 2024-05-11 ENCOUNTER — Other Ambulatory Visit: Payer: Self-pay | Admitting: Cardiovascular Disease

## 2024-05-13 NOTE — Telephone Encounter (Signed)
 Prescription refill request for Eliquis  received. Indication: HOCM Last office visit: 02/02/24  P Nahser MD Scr: 1.47 on 06/05/23  Epic Age: 78 Weight: 91.4kg  Based on above findings Eliquis  5mg  twice daily is the appropriate dose.  Refill approved.

## 2024-05-23 NOTE — Addendum Note (Signed)
 Addended by: TAWNI DRILLING D on: 05/23/2024 01:07 PM   Modules accepted: Orders

## 2024-05-23 NOTE — Progress Notes (Signed)
 Remote pacemaker transmission.

## 2024-05-31 DIAGNOSIS — F3170 Bipolar disorder, currently in remission, most recent episode unspecified: Secondary | ICD-10-CM | POA: Diagnosis not present

## 2024-05-31 DIAGNOSIS — Z79899 Other long term (current) drug therapy: Secondary | ICD-10-CM | POA: Diagnosis not present

## 2024-06-01 DIAGNOSIS — N183 Chronic kidney disease, stage 3 unspecified: Secondary | ICD-10-CM | POA: Diagnosis not present

## 2024-06-06 DIAGNOSIS — E7800 Pure hypercholesterolemia, unspecified: Secondary | ICD-10-CM | POA: Diagnosis not present

## 2024-06-06 DIAGNOSIS — E291 Testicular hypofunction: Secondary | ICD-10-CM | POA: Diagnosis not present

## 2024-06-06 DIAGNOSIS — I4891 Unspecified atrial fibrillation: Secondary | ICD-10-CM | POA: Diagnosis not present

## 2024-06-06 DIAGNOSIS — Z950 Presence of cardiac pacemaker: Secondary | ICD-10-CM | POA: Diagnosis not present

## 2024-06-06 DIAGNOSIS — Z0000 Encounter for general adult medical examination without abnormal findings: Secondary | ICD-10-CM | POA: Diagnosis not present

## 2024-06-06 DIAGNOSIS — Z4542 Encounter for adjustment and management of neuropacemaker (brain) (peripheral nerve) (spinal cord): Secondary | ICD-10-CM | POA: Diagnosis not present

## 2024-06-06 DIAGNOSIS — I421 Obstructive hypertrophic cardiomyopathy: Secondary | ICD-10-CM | POA: Diagnosis not present

## 2024-06-06 DIAGNOSIS — N183 Chronic kidney disease, stage 3 unspecified: Secondary | ICD-10-CM | POA: Diagnosis not present

## 2024-06-06 DIAGNOSIS — G4733 Obstructive sleep apnea (adult) (pediatric): Secondary | ICD-10-CM | POA: Diagnosis not present

## 2024-06-06 DIAGNOSIS — F322 Major depressive disorder, single episode, severe without psychotic features: Secondary | ICD-10-CM | POA: Diagnosis not present

## 2024-06-27 DIAGNOSIS — G4733 Obstructive sleep apnea (adult) (pediatric): Secondary | ICD-10-CM | POA: Diagnosis not present

## 2024-07-01 DIAGNOSIS — N183 Chronic kidney disease, stage 3 unspecified: Secondary | ICD-10-CM | POA: Diagnosis not present

## 2024-07-02 ENCOUNTER — Encounter

## 2024-07-04 DIAGNOSIS — G4733 Obstructive sleep apnea (adult) (pediatric): Secondary | ICD-10-CM | POA: Diagnosis not present

## 2024-07-07 DIAGNOSIS — F322 Major depressive disorder, single episode, severe without psychotic features: Secondary | ICD-10-CM | POA: Diagnosis not present

## 2024-07-07 DIAGNOSIS — E7800 Pure hypercholesterolemia, unspecified: Secondary | ICD-10-CM | POA: Diagnosis not present

## 2024-07-07 DIAGNOSIS — N183 Chronic kidney disease, stage 3 unspecified: Secondary | ICD-10-CM | POA: Diagnosis not present

## 2024-07-31 DIAGNOSIS — N183 Chronic kidney disease, stage 3 unspecified: Secondary | ICD-10-CM | POA: Diagnosis not present

## 2024-08-06 DIAGNOSIS — Z4542 Encounter for adjustment and management of neuropacemaker (brain) (peripheral nerve) (spinal cord): Secondary | ICD-10-CM | POA: Diagnosis not present

## 2024-08-06 DIAGNOSIS — F324 Major depressive disorder, single episode, in partial remission: Secondary | ICD-10-CM | POA: Diagnosis not present

## 2024-08-06 DIAGNOSIS — N183 Chronic kidney disease, stage 3 unspecified: Secondary | ICD-10-CM | POA: Diagnosis not present

## 2024-08-06 DIAGNOSIS — Z950 Presence of cardiac pacemaker: Secondary | ICD-10-CM | POA: Diagnosis not present

## 2024-08-06 DIAGNOSIS — G4733 Obstructive sleep apnea (adult) (pediatric): Secondary | ICD-10-CM | POA: Diagnosis not present

## 2024-08-06 DIAGNOSIS — I421 Obstructive hypertrophic cardiomyopathy: Secondary | ICD-10-CM | POA: Diagnosis not present

## 2024-08-06 DIAGNOSIS — E291 Testicular hypofunction: Secondary | ICD-10-CM | POA: Diagnosis not present

## 2024-08-06 DIAGNOSIS — I4891 Unspecified atrial fibrillation: Secondary | ICD-10-CM | POA: Diagnosis not present

## 2024-08-09 ENCOUNTER — Other Ambulatory Visit: Payer: Self-pay | Admitting: Internal Medicine

## 2024-08-12 DIAGNOSIS — Z23 Encounter for immunization: Secondary | ICD-10-CM | POA: Diagnosis not present

## 2024-09-05 DIAGNOSIS — I4891 Unspecified atrial fibrillation: Secondary | ICD-10-CM | POA: Diagnosis not present

## 2024-09-05 DIAGNOSIS — Z4542 Encounter for adjustment and management of neuropacemaker (brain) (peripheral nerve) (spinal cord): Secondary | ICD-10-CM | POA: Diagnosis not present

## 2024-09-05 DIAGNOSIS — G4733 Obstructive sleep apnea (adult) (pediatric): Secondary | ICD-10-CM | POA: Diagnosis not present

## 2024-09-06 DIAGNOSIS — F322 Major depressive disorder, single episode, severe without psychotic features: Secondary | ICD-10-CM | POA: Diagnosis not present

## 2024-09-06 DIAGNOSIS — E7800 Pure hypercholesterolemia, unspecified: Secondary | ICD-10-CM | POA: Diagnosis not present

## 2024-09-06 DIAGNOSIS — N183 Chronic kidney disease, stage 3 unspecified: Secondary | ICD-10-CM | POA: Diagnosis not present

## 2024-09-16 ENCOUNTER — Other Ambulatory Visit: Payer: Self-pay | Admitting: Internal Medicine

## 2024-09-20 DIAGNOSIS — I421 Obstructive hypertrophic cardiomyopathy: Secondary | ICD-10-CM | POA: Diagnosis not present

## 2024-09-20 DIAGNOSIS — Z79899 Other long term (current) drug therapy: Secondary | ICD-10-CM | POA: Diagnosis not present

## 2024-09-20 DIAGNOSIS — N183 Chronic kidney disease, stage 3 unspecified: Secondary | ICD-10-CM | POA: Diagnosis not present

## 2024-09-20 DIAGNOSIS — Z950 Presence of cardiac pacemaker: Secondary | ICD-10-CM | POA: Diagnosis not present

## 2024-09-20 DIAGNOSIS — F322 Major depressive disorder, single episode, severe without psychotic features: Secondary | ICD-10-CM | POA: Diagnosis not present

## 2024-09-20 DIAGNOSIS — Z0000 Encounter for general adult medical examination without abnormal findings: Secondary | ICD-10-CM | POA: Diagnosis not present

## 2024-09-20 DIAGNOSIS — E222 Syndrome of inappropriate secretion of antidiuretic hormone: Secondary | ICD-10-CM | POA: Diagnosis not present

## 2024-09-20 DIAGNOSIS — E291 Testicular hypofunction: Secondary | ICD-10-CM | POA: Diagnosis not present

## 2024-09-20 DIAGNOSIS — E7800 Pure hypercholesterolemia, unspecified: Secondary | ICD-10-CM | POA: Diagnosis not present

## 2024-09-20 DIAGNOSIS — Z23 Encounter for immunization: Secondary | ICD-10-CM | POA: Diagnosis not present

## 2024-09-20 DIAGNOSIS — G4733 Obstructive sleep apnea (adult) (pediatric): Secondary | ICD-10-CM | POA: Diagnosis not present

## 2024-09-20 LAB — LAB REPORT - SCANNED
A1c: 6
Creatinine, POC: 118 mg/dL
EGFR: 47

## 2024-09-28 ENCOUNTER — Other Ambulatory Visit: Payer: Self-pay | Admitting: Internal Medicine

## 2024-10-01 ENCOUNTER — Ambulatory Visit

## 2024-10-01 DIAGNOSIS — I442 Atrioventricular block, complete: Secondary | ICD-10-CM | POA: Diagnosis not present

## 2024-10-02 LAB — CUP PACEART REMOTE DEVICE CHECK
Battery Remaining Longevity: 83 mo
Battery Voltage: 2.98 V
Brady Statistic AP VP Percent: 9.36 %
Brady Statistic AP VS Percent: 56.45 %
Brady Statistic AS VP Percent: 4.15 %
Brady Statistic AS VS Percent: 30.04 %
Brady Statistic RA Percent Paced: 66.36 %
Brady Statistic RV Percent Paced: 13.56 %
Date Time Interrogation Session: 20251125102333
Implantable Lead Connection Status: 753985
Implantable Lead Connection Status: 753985
Implantable Lead Implant Date: 20180919
Implantable Lead Implant Date: 20180919
Implantable Lead Location: 753859
Implantable Lead Location: 753860
Implantable Lead Model: 5076
Implantable Lead Model: 5076
Implantable Pulse Generator Implant Date: 20180919
Lead Channel Impedance Value: 285 Ohm
Lead Channel Impedance Value: 304 Ohm
Lead Channel Impedance Value: 361 Ohm
Lead Channel Impedance Value: 361 Ohm
Lead Channel Pacing Threshold Amplitude: 0.5 V
Lead Channel Pacing Threshold Amplitude: 1.75 V
Lead Channel Pacing Threshold Pulse Width: 0.4 ms
Lead Channel Pacing Threshold Pulse Width: 0.4 ms
Lead Channel Sensing Intrinsic Amplitude: 2.75 mV
Lead Channel Sensing Intrinsic Amplitude: 2.75 mV
Lead Channel Sensing Intrinsic Amplitude: 2.875 mV
Lead Channel Sensing Intrinsic Amplitude: 2.875 mV
Lead Channel Setting Pacing Amplitude: 2 V
Lead Channel Setting Pacing Amplitude: 2.5 V
Lead Channel Setting Pacing Pulse Width: 0.8 ms
Lead Channel Setting Sensing Sensitivity: 1.2 mV
Zone Setting Status: 755011
Zone Setting Status: 755011

## 2024-10-04 ENCOUNTER — Other Ambulatory Visit: Payer: Self-pay | Admitting: Internal Medicine

## 2024-10-06 DIAGNOSIS — E7800 Pure hypercholesterolemia, unspecified: Secondary | ICD-10-CM | POA: Diagnosis not present

## 2024-10-06 DIAGNOSIS — F322 Major depressive disorder, single episode, severe without psychotic features: Secondary | ICD-10-CM | POA: Diagnosis not present

## 2024-10-06 DIAGNOSIS — N183 Chronic kidney disease, stage 3 unspecified: Secondary | ICD-10-CM | POA: Diagnosis not present

## 2024-10-07 ENCOUNTER — Telehealth: Payer: Self-pay

## 2024-10-07 NOTE — Telephone Encounter (Signed)
 Alert received from [employer] for  676 short V-V intervals since 02-Apr-2024 23:42:45. Check for double-counted R waves, lead fracture, or loose set screw lead trends stable.  Not noted on previous report.  Routing to triage for awareness per protocol.   Possible RV LOC noted intermittently thoughout the attached EGM' S.  Spoke to Donley, MDT Rep who advised need to bring patient into clinic to check isometrics and give more safety margin on RV Output. RV LOC could lead to the elevated short V-V's.

## 2024-10-07 NOTE — Progress Notes (Signed)
 Remote PPM Transmission

## 2024-10-08 ENCOUNTER — Ambulatory Visit: Attending: Cardiology

## 2024-10-08 DIAGNOSIS — I442 Atrioventricular block, complete: Secondary | ICD-10-CM | POA: Insufficient documentation

## 2024-10-08 MED ORDER — METOPROLOL SUCCINATE ER 100 MG PO TB24: 100.0000 mg | ORAL_TABLET | Freq: Every day | ORAL | 1 refills | Status: DC

## 2024-10-08 NOTE — Telephone Encounter (Signed)
 I called and spoke with the patient.  He is aware of the need to come into the device clinic to reassess his RV lead.  The patient prefers to come this afternoon.  He is scheduled to see the device clinic nurse at 2:00 pm today.  I have advised the patient to arrive 15-20 minutes prior to his appointment time to check in. He is aware of our new location and where to come for the appointment.  The patient advised that he is also needing a refill on his metoprolol  succinate. He is due to be seen in follow up with general cardiology.  I have advised him that when he checks in today to please see if the ladies at check in can go ahead and schedule his follow up with general cards (previously seen by Dr. Calhoun- the patient is aware Dr. Calhoun has retired). He is also in need an of EP appointment as well.   Confirmed the patient is taking metoprolol  succinate 100 mg once daily. He is aware I will send an additional refill in to [facility] to hold him over until he can be seen in the office by general cardiology.   The patient voices understanding of all of the above and is agreeable.  He was appreciative of the call.

## 2024-10-09 ENCOUNTER — Ambulatory Visit: Payer: Self-pay | Admitting: Internal Medicine

## 2024-10-09 DIAGNOSIS — I4891 Unspecified atrial fibrillation: Secondary | ICD-10-CM | POA: Diagnosis not present

## 2024-10-09 DIAGNOSIS — N183 Chronic kidney disease, stage 3 unspecified: Secondary | ICD-10-CM | POA: Diagnosis not present

## 2024-10-09 DIAGNOSIS — I421 Obstructive hypertrophic cardiomyopathy: Secondary | ICD-10-CM | POA: Diagnosis not present

## 2024-10-09 DIAGNOSIS — Z4542 Encounter for adjustment and management of neuropacemaker (brain) (peripheral nerve) (spinal cord): Secondary | ICD-10-CM | POA: Diagnosis not present

## 2024-10-09 DIAGNOSIS — Z950 Presence of cardiac pacemaker: Secondary | ICD-10-CM | POA: Diagnosis not present

## 2024-10-09 DIAGNOSIS — F322 Major depressive disorder, single episode, severe without psychotic features: Secondary | ICD-10-CM | POA: Diagnosis not present

## 2024-10-09 DIAGNOSIS — G4733 Obstructive sleep apnea (adult) (pediatric): Secondary | ICD-10-CM | POA: Diagnosis not present

## 2024-10-09 DIAGNOSIS — E291 Testicular hypofunction: Secondary | ICD-10-CM | POA: Diagnosis not present

## 2024-10-09 NOTE — Progress Notes (Signed)
 Patient seen in clinic to assess RV lead (see 12/1 phone note)  [employer] rep Guiddel present for assistance with check  RV lead tested and episode record reviewed   RV lead sensing, threshold, and impedance values all consistent with previous measurements and stable  No EGM's present on episodes to confidently confirm any short V-V or double counting of R waves per MDT rep  Ventricular blanking post VS period extended to 200 mS to avoid any possible future oversensing issues  Incidentally, while performing isometrics to see if RV lead was effected, noise appeared on RA lead  Will route paceart report to EP provider for awareness

## 2024-10-10 LAB — CUP PACEART INCLINIC DEVICE CHECK
Date Time Interrogation Session: 20251202124351
Implantable Lead Connection Status: 753985
Implantable Lead Connection Status: 753985
Implantable Lead Implant Date: 20180919
Implantable Lead Implant Date: 20180919
Implantable Lead Location: 753859
Implantable Lead Location: 753860
Implantable Lead Model: 5076
Implantable Lead Model: 5076
Implantable Pulse Generator Implant Date: 20180919

## 2024-11-05 ENCOUNTER — Other Ambulatory Visit: Payer: Self-pay

## 2024-11-05 MED ORDER — APIXABAN 5 MG PO TABS: 5.0000 mg | ORAL_TABLET | Freq: Two times a day (BID) | ORAL | 1 refills | Status: AC

## 2024-11-05 NOTE — Telephone Encounter (Signed)
 Prescription refill request for Eliquis  received. Indication:afib Last office visit:3/25 Scr: 1.50  11/25 Age:78 Weight:91.4  kg  Prescription refilled

## 2024-11-27 ENCOUNTER — Other Ambulatory Visit: Payer: Self-pay

## 2024-11-27 ENCOUNTER — Emergency Department (HOSPITAL_COMMUNITY)
Admission: EM | Admit: 2024-11-27 | Discharge: 2024-11-27 | Disposition: A | Discharge status: Home / Self Care | Attending: Emergency Medicine | Admitting: Emergency Medicine

## 2024-11-27 DIAGNOSIS — Z7901 Long term (current) use of anticoagulants: Secondary | ICD-10-CM | POA: Insufficient documentation

## 2024-11-27 DIAGNOSIS — Z79899 Other long term (current) drug therapy: Secondary | ICD-10-CM | POA: Insufficient documentation

## 2024-11-27 DIAGNOSIS — Z950 Presence of cardiac pacemaker: Secondary | ICD-10-CM | POA: Diagnosis not present

## 2024-11-27 DIAGNOSIS — Y900 Blood alcohol level of less than 20 mg/100 ml: Secondary | ICD-10-CM | POA: Insufficient documentation

## 2024-11-27 DIAGNOSIS — R55 Syncope and collapse: Secondary | ICD-10-CM | POA: Diagnosis present

## 2024-11-27 LAB — COMPREHENSIVE METABOLIC PANEL WITH GFR
ALT: 18 U/L (ref 0–44)
AST: 26 U/L (ref 15–41)
Albumin: 4.2 g/dL (ref 3.5–5.0)
Alkaline Phosphatase: 73 U/L (ref 38–126)
Anion gap: 9 (ref 5–15)
BUN: 17 mg/dL (ref 8–23)
CO2: 27 mmol/L (ref 22–32)
Calcium: 9.2 mg/dL (ref 8.9–10.3)
Chloride: 103 mmol/L (ref 98–111)
Creatinine, Ser: 1.43 mg/dL — ABNORMAL HIGH (ref 0.61–1.24)
GFR, Estimated: 50 mL/min — ABNORMAL LOW
Glucose, Bld: 94 mg/dL (ref 70–99)
Potassium: 4.6 mmol/L (ref 3.5–5.1)
Sodium: 139 mmol/L (ref 135–145)
Total Bilirubin: 0.4 mg/dL (ref 0.0–1.2)
Total Protein: 6.4 g/dL — ABNORMAL LOW (ref 6.5–8.1)

## 2024-11-27 LAB — ETHANOL: Alcohol, Ethyl (B): <15 mg/dL

## 2024-11-27 LAB — TROPONIN T, HIGH SENSITIVITY
Troponin T High Sensitivity: 12 ng/L (ref 0–19)
Troponin T High Sensitivity: 11 ng/L (ref 0–19)

## 2024-11-27 LAB — CBC
HCT: 40.3 % (ref 39.0–52.0)
Hemoglobin: 13.7 g/dL (ref 13.0–17.0)
MCH: 29.7 pg (ref 26.0–34.0)
MCHC: 34 g/dL (ref 30.0–36.0)
MCV: 87.2 fL (ref 80.0–100.0)
Platelets: 223 K/uL (ref 150–400)
RBC: 4.62 MIL/uL (ref 4.22–5.81)
RDW: 13.1 % (ref 11.5–15.5)
WBC: 6.6 K/uL (ref 4.0–10.5)
nRBC: 0 % (ref 0.0–0.2)

## 2024-11-27 LAB — LIPASE, BLOOD: Lipase: 33 U/L (ref 11–51)

## 2024-11-27 MED ORDER — LACTATED RINGERS IV BOLUS
500.0000 mL | Freq: Once | INTRAVENOUS | Status: AC
  Administered 2024-11-27: 500 mL via INTRAVENOUS

## 2024-11-27 NOTE — ED Provider Notes (Signed)
 " Michael Barton EMERGENCY DEPARTMENT AT [facility] Provider Note   CSN: [id number] Arrival date & time: 11/27/24  1311     Patient presents with: Loss of Consciousness   Michael Barton is a 79 y.o. male.   HPI 79 year old male presents today after syncopal episode.  He states that he did not have any breakfast and went out for lunch.  He ordered a alcoholic beverage.  He states he ate 2 pieces of breath prior to drinking it.  He drank it and then got up to go look for his friend.  He felt lightheaded and had a syncopal episode.  He awoke to find the status just telling people to call 911.  He states that he had some urinary incontinence.  He has no complaints of chest pain, cough, fever, chills.  He has a pacemaker in place.  He denies injury    Prior to Admission medications  Medication Sig Start Date End Date Taking? Authorizing Provider  apixaban  (ELIQUIS ) 5 MG TABS tablet Take 1 tablet (5 mg total) by mouth 2 (two) times daily. 11/05/24   Waddell Danelle ORN, MD  buPROPion  (WELLBUTRIN  SR) 100 MG 12 hr tablet Take 100 mg by mouth 2 (two) times daily.    [provider]  diazepam (VALIUM) 10 MG tablet Take 10 mg by mouth daily as needed. 09/21/23   [provider]  ibuprofen (ADVIL) 200 MG tablet Take 600 mg by mouth every 6 (six) hours as needed for moderate pain.    [provider]  ivabradine  (CORLANOR ) 5 MG TABS tablet Take 15mg  (3 tablets) by mouth two hours prior to test 06/05/23   Nahser, Aleene PARAS, MD  lamoTRIgine  (LAMICTAL ) 200 MG tablet Take 200 mg by mouth daily.    [provider]  metoprolol  succinate (TOPROL -XL) 100 MG 24 hr tablet Take 1 tablet (100 mg total) by mouth daily. 10/08/24   Waddell Danelle ORN, MD  pravastatin  (PRAVACHOL ) 80 MG tablet Take 1 tablet (80 mg total) by mouth daily. 11/05/20   Krishnan, Sendil K, MD  QUEtiapine  (SEROQUEL ) 200 MG tablet Take 200 mg by mouth at bedtime.    [provider]  tamsulosin  (FLOMAX) 0.4 MG CAPS capsule Take 0.4 mg by mouth daily. 01/11/24   [provider]  Testosterone  1.62 % GEL Apply 2 Pump topically in the morning. Apply 1 pump per shoulder 09/17/20   [provider]  triamcinolone cream (KENALOG) 0.1 % Apply 1 Application topically.    [provider]  venlafaxine  XR (EFFEXOR -XR) 150 MG 24 hr capsule Take 150 mg by mouth daily with breakfast.    [provider]    Allergies: Ambien [zolpidem tartrate] and Prozac [fluoxetine]    Review of Systems  Updated Vital Signs BP 94/75   Pulse 67   Temp (!) 97.3 F (36.3 C) (Oral)   Resp 12   SpO2 100%   Physical Exam Vitals reviewed.  Constitutional:      Appearance: Normal appearance.  HENT:     Head: Normocephalic.     Right Ear: External ear normal.     Left Ear: External ear normal.     Nose: Nose normal.     Mouth/Throat:     Mouth: Mucous membranes are moist.     Pharynx: Oropharynx is clear.  Eyes:     Pupils: Pupils are equal, round, and reactive to light.  Cardiovascular:     Rate and Rhythm: Normal rate and regular rhythm.  Pulses: Normal pulses.  Pulmonary:     Effort: Pulmonary effort is normal.     Breath sounds: Normal breath sounds.  Abdominal:     General: Abdomen is flat.     Palpations: Abdomen is soft.  Musculoskeletal:        General: Normal range of motion.     Cervical back: Normal range of motion.  Skin:    General: Skin is warm.     Capillary Refill: Capillary refill takes less than 2 seconds.  Neurological:     General: No focal deficit present.     Mental Status: He is alert.  Psychiatric:        Mood and Affect: Mood normal.     (all labs ordered are listed, but only abnormal results are displayed) Labs Reviewed  CBC  COMPREHENSIVE METABOLIC PANEL WITH GFR  LIPASE, BLOOD  ETHANOL  TROPONIN T, HIGH SENSITIVITY    EKG: EKG Interpretation Date/Time:  Wednesday November 27 2024 13:26:22 EST Ventricular Rate:  64 PR  Interval:  205 QRS Duration:  152 QT Interval:  426 QTC Calculation: 440 R Axis:   56  Text Interpretation: Sinus rhythm Right bundle branch block Confirmed by Levander Houston [phone]) on 11/27/2024 2:27:52 PM  Radiology: No results found.   Procedures   Medications Ordered in the ED  lactated ringers  bolus 500 mL (500 mLs Intravenous New Bag/Given 11/27/24 1443)                                    Medical Decision Making Amount and/or Complexity of Data Reviewed Labs: ordered.   79 year old male history of pacemaker presents today with syncopal episode.  Patient did not have anything to eat or drink this morning before he went out to lunch.  He had a drink of alcohol.  He did up and had a syncopal episode.  Patient does not know how long he was unconscious or whether or not there was any seizure activity.  He did have some urinary incontinence with this.  He states that he has had previous episodes of syncope in the past.  He denies any associated symptoms or injury. Patient is being evaluated with EKG and labs Differential diagnosis is broad and includes but is not limited to orthostatic hypotension and syncope, cardiac event including arrhythmia, anemia, seizure, and other electrolyte abnormalities. Patient is on multiple medications which could contribute to orthostatic hypotension and syncope including metoprolol , Seroquel  EKG reviewed interpreted and normal sinus rhythm no evidence of acute ischemia.  Plan interrogation of pacemaker and troponins No injury from event noted.  Patient does not have a headache.  He is on blood thinners.  Do not think that a head CT is indicated at this time. CBC reviewed interpreted and within normal limits Remainder of labs including complete metabolic panel troponin T need to be reviewed. Orthostatic vital signs Pacemaker interrogation reported to be reviewed Discussed above with Dr. Jackquline who accepts patient in signout     Final diagnoses:   Syncope, unspecified syncope type    ED Discharge Orders     None          Levander Houston, MD 11/27/24 1518  "

## 2024-11-27 NOTE — ED Triage Notes (Addendum)
 Patient BIBA coming from restaurant after syncopal episode, patient did not eat today and had 1 margarita, has a pacemaker, patient fell backward into wall and slumped to ground, does not recall hitting his head, no active bleeding, PERRLA. 118/70 HR 70 CBG 136. Patient is alert and oriented x 4. Airway patent, respirations even and unlabored. Skin normal, warm and dry. Denies pain. Episode of urinary incontinence post syncope.

## 2024-11-27 NOTE — ED Provider Notes (Signed)
" °  Physical Exam  BP 121/72   Pulse 66   Temp 98 F (36.7 C) (Oral)   Resp 17   SpO2 96%   Physical Exam  Procedures  Procedures  ED Course / MDM    Medical Decision Making Amount and/or Complexity of Data Reviewed Labs: ordered.   Received patient in signout.  Syncope.  History of syncope as well as orthostasis.  Got up to look for friend about lightheaded and had a syncopal episode.  Patient here mentating well.  So far, workup has been unremarkable.   Labs unremarkable.  Creatinine at baseline.  EKG normal sinus rhythm.  Repeat troponin.  Initial troponin unremarkable.     Patient maker interrogation unremarkable.  No arrhythmia at time of syncope.  Patient asymptomatic here.  Labs unremarkable.  Remained on cardiac telemetry.  normal sinus rhythm.  Cleared for discharge home   Simon Lavonia SAILOR, MD 11/27/24 1946  "

## 2024-11-27 NOTE — Discharge Instructions (Addendum)
 Please follow-up with primary care physician.  If this happens again, please come back to the ED.  Please make sure to eat and drink in the morning. Please go from a sitting and standing position in a slow manner.

## 2024-12-03 ENCOUNTER — Other Ambulatory Visit: Payer: Self-pay | Admitting: Internal Medicine

## 2024-12-31 ENCOUNTER — Encounter

## 2025-04-01 ENCOUNTER — Encounter

## 2025-07-01 ENCOUNTER — Encounter
